# Patient Record
Sex: Female | Born: 1960 | Race: White | Hispanic: No | Marital: Married | Smoking: Former smoker
Health system: Southern US, Community
[De-identification: ages and names within clinical notes are randomized; demographics above are authoritative.]

## PROBLEM LIST (undated history)

## (undated) DIAGNOSIS — J45909 Unspecified asthma, uncomplicated: Secondary | ICD-10-CM

## (undated) DIAGNOSIS — I2510 Atherosclerotic heart disease of native coronary artery without angina pectoris: Secondary | ICD-10-CM

## (undated) DIAGNOSIS — I255 Ischemic cardiomyopathy: Secondary | ICD-10-CM

## (undated) DIAGNOSIS — F419 Anxiety disorder, unspecified: Secondary | ICD-10-CM

## (undated) DIAGNOSIS — M1990 Unspecified osteoarthritis, unspecified site: Secondary | ICD-10-CM

## (undated) DIAGNOSIS — J449 Chronic obstructive pulmonary disease, unspecified: Secondary | ICD-10-CM

## (undated) DIAGNOSIS — D649 Anemia, unspecified: Secondary | ICD-10-CM

## (undated) DIAGNOSIS — I10 Essential (primary) hypertension: Secondary | ICD-10-CM

## (undated) DIAGNOSIS — I219 Acute myocardial infarction, unspecified: Secondary | ICD-10-CM

## (undated) DIAGNOSIS — E785 Hyperlipidemia, unspecified: Secondary | ICD-10-CM

## (undated) HISTORY — DX: Acute myocardial infarction, unspecified: I21.9

## (undated) HISTORY — DX: Ischemic cardiomyopathy: I25.5

## (undated) HISTORY — DX: Morbid (severe) obesity due to excess calories: E66.01

## (undated) HISTORY — PX: KNEE ARTHROSCOPY: SUR90

## (undated) HISTORY — DX: Atherosclerotic heart disease of native coronary artery without angina pectoris: I25.10

## (undated) HISTORY — DX: Essential (primary) hypertension: I10

## (undated) HISTORY — DX: Hyperlipidemia, unspecified: E78.5

## (undated) HISTORY — DX: Chronic obstructive pulmonary disease, unspecified: J44.9

## (undated) HISTORY — PX: TONSILLECTOMY: SUR1361

## (undated) MED FILL — Medication: Fill #1 | Status: CN

---

## 2006-03-25 ENCOUNTER — Emergency Department: Payer: Self-pay | Admitting: Emergency Medicine

## 2006-04-01 ENCOUNTER — Emergency Department: Payer: Self-pay | Admitting: Emergency Medicine

## 2007-07-22 ENCOUNTER — Emergency Department: Payer: Self-pay | Admitting: Internal Medicine

## 2008-04-24 ENCOUNTER — Emergency Department: Payer: Self-pay | Admitting: Emergency Medicine

## 2008-12-21 ENCOUNTER — Emergency Department: Payer: Self-pay | Admitting: Emergency Medicine

## 2010-03-06 ENCOUNTER — Emergency Department: Payer: Self-pay | Admitting: Internal Medicine

## 2015-06-28 ENCOUNTER — Emergency Department: Payer: No Typology Code available for payment source

## 2015-06-28 ENCOUNTER — Emergency Department
Admission: EM | Admit: 2015-06-28 | Discharge: 2015-06-28 | Disposition: A | Discharge status: Home / Self Care | Payer: No Typology Code available for payment source | Attending: Emergency Medicine | Admitting: Emergency Medicine

## 2015-06-28 DIAGNOSIS — Y9389 Activity, other specified: Secondary | ICD-10-CM | POA: Insufficient documentation

## 2015-06-28 DIAGNOSIS — M542 Cervicalgia: Secondary | ICD-10-CM | POA: Diagnosis present

## 2015-06-28 DIAGNOSIS — Y92410 Unspecified street and highway as the place of occurrence of the external cause: Secondary | ICD-10-CM | POA: Diagnosis not present

## 2015-06-28 DIAGNOSIS — R0781 Pleurodynia: Secondary | ICD-10-CM

## 2015-06-28 DIAGNOSIS — Y999 Unspecified external cause status: Secondary | ICD-10-CM | POA: Insufficient documentation

## 2015-06-28 DIAGNOSIS — S161XXA Strain of muscle, fascia and tendon at neck level, initial encounter: Secondary | ICD-10-CM | POA: Insufficient documentation

## 2015-06-28 DIAGNOSIS — M25511 Pain in right shoulder: Secondary | ICD-10-CM | POA: Insufficient documentation

## 2015-06-28 MED ORDER — METHOCARBAMOL 500 MG PO TABS
1000.0000 mg | ORAL_TABLET | Freq: Once | ORAL | Status: AC
  Administered 2015-06-28: 1000 mg via ORAL
  Filled 2015-06-28: qty 2

## 2015-06-28 MED ORDER — IBUPROFEN 600 MG PO TABS: 600.0000 mg | ORAL_TABLET | Freq: Three times a day (TID) | ORAL | Status: DC | PRN

## 2015-06-28 MED ORDER — OXYCODONE-ACETAMINOPHEN 5-325 MG PO TABS
1.0000 | ORAL_TABLET | Freq: Once | ORAL | Status: AC
  Administered 2015-06-28: 1 via ORAL
  Filled 2015-06-28: qty 1

## 2015-06-28 MED ORDER — IBUPROFEN 600 MG PO TABS
600.0000 mg | ORAL_TABLET | Freq: Once | ORAL | Status: AC
Start: 2015-06-28 — End: 2015-06-28
  Administered 2015-06-28: 600 mg via ORAL
  Filled 2015-06-28: qty 1

## 2015-06-28 MED ORDER — TRAMADOL HCL 50 MG PO TABS: 50.0000 mg | ORAL_TABLET | Freq: Four times a day (QID) | ORAL | Status: DC | PRN

## 2015-06-28 MED ORDER — METHOCARBAMOL 750 MG PO TABS: 750.0000 mg | ORAL_TABLET | Freq: Four times a day (QID) | ORAL | Status: DC
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
## 2015-06-28 NOTE — Discharge Instructions (Signed)
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
## 2015-07-01 ENCOUNTER — Emergency Department
Admission: EM | Admit: 2015-07-01 | Discharge: 2015-07-01 | Disposition: A | Discharge status: Home / Self Care | Payer: No Typology Code available for payment source | Attending: Emergency Medicine | Admitting: Emergency Medicine

## 2015-07-01 DIAGNOSIS — Y939 Activity, unspecified: Secondary | ICD-10-CM | POA: Diagnosis not present

## 2015-07-01 DIAGNOSIS — S46911A Strain of unspecified muscle, fascia and tendon at shoulder and upper arm level, right arm, initial encounter: Secondary | ICD-10-CM | POA: Insufficient documentation

## 2015-07-01 DIAGNOSIS — Y92410 Unspecified street and highway as the place of occurrence of the external cause: Secondary | ICD-10-CM | POA: Diagnosis not present

## 2015-07-01 DIAGNOSIS — S239XXA Sprain of unspecified parts of thorax, initial encounter: Secondary | ICD-10-CM

## 2015-07-01 DIAGNOSIS — Y999 Unspecified external cause status: Secondary | ICD-10-CM | POA: Diagnosis not present

## 2015-07-01 DIAGNOSIS — M79641 Pain in right hand: Secondary | ICD-10-CM | POA: Diagnosis present

## 2015-07-01 MED ORDER — TRAMADOL HCL 50 MG PO TABS: 50.0000 mg | ORAL_TABLET | Freq: Three times a day (TID) | ORAL | Status: DC | PRN

## 2015-07-01 MED ORDER — IBUPROFEN 800 MG PO TABS: 800.0000 mg | ORAL_TABLET | Freq: Three times a day (TID) | ORAL | Status: DC | PRN

## 2015-07-01 MED ORDER — METHOCARBAMOL 750 MG PO TABS: 750.0000 mg | ORAL_TABLET | Freq: Four times a day (QID) | ORAL | Status: DC
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
## 2015-08-24 ENCOUNTER — Other Ambulatory Visit: Payer: Self-pay | Admitting: Student

## 2015-08-24 DIAGNOSIS — M7581 Other shoulder lesions, right shoulder: Secondary | ICD-10-CM

## 2015-08-24 DIAGNOSIS — M7521 Bicipital tendinitis, right shoulder: Secondary | ICD-10-CM

## 2015-09-01 ENCOUNTER — Other Ambulatory Visit: Payer: Self-pay | Admitting: Student

## 2015-09-01 DIAGNOSIS — M5412 Radiculopathy, cervical region: Secondary | ICD-10-CM

## 2015-09-03 ENCOUNTER — Ambulatory Visit
Admission: RE | Admit: 2015-09-03 | Discharge: 2015-09-03 | Disposition: A | Discharge status: Home / Self Care | Payer: No Typology Code available for payment source | Source: Ambulatory Visit | Attending: Student | Admitting: Student

## 2015-09-03 DIAGNOSIS — M67911 Unspecified disorder of synovium and tendon, right shoulder: Secondary | ICD-10-CM | POA: Insufficient documentation

## 2015-09-03 DIAGNOSIS — E010 Iodine-deficiency related diffuse (endemic) goiter: Secondary | ICD-10-CM | POA: Diagnosis not present

## 2015-09-03 DIAGNOSIS — M5030 Other cervical disc degeneration, unspecified cervical region: Secondary | ICD-10-CM | POA: Diagnosis not present

## 2015-09-03 DIAGNOSIS — M19011 Primary osteoarthritis, right shoulder: Secondary | ICD-10-CM | POA: Diagnosis not present

## 2015-09-03 DIAGNOSIS — M24211 Disorder of ligament, right shoulder: Secondary | ICD-10-CM | POA: Insufficient documentation

## 2015-09-03 DIAGNOSIS — M50223 Other cervical disc displacement at C6-C7 level: Secondary | ICD-10-CM | POA: Insufficient documentation

## 2015-09-03 DIAGNOSIS — M5412 Radiculopathy, cervical region: Secondary | ICD-10-CM | POA: Insufficient documentation

## 2015-09-03 DIAGNOSIS — M4802 Spinal stenosis, cervical region: Secondary | ICD-10-CM | POA: Diagnosis not present

## 2015-09-03 DIAGNOSIS — M7521 Bicipital tendinitis, right shoulder: Secondary | ICD-10-CM

## 2015-09-03 DIAGNOSIS — M7581 Other shoulder lesions, right shoulder: Secondary | ICD-10-CM

## 2015-09-14 ENCOUNTER — Ambulatory Visit: Admission: RE | Admit: 2015-09-14 | Payer: No Typology Code available for payment source | Source: Ambulatory Visit

## 2015-11-15 ENCOUNTER — Encounter
Admission: RE | Admit: 2015-11-15 | Discharge: 2015-11-15 | Disposition: A | Discharge status: Home / Self Care | Payer: No Typology Code available for payment source | Source: Ambulatory Visit | Attending: Surgery | Admitting: Surgery

## 2015-11-15 NOTE — Patient Instructions (Signed)
  Your procedure is scheduled on: 11-22-15 Report to Same Day Surgery 2nd floor medical mall To find out your arrival time please call [phone] between 1PM - 3PM on 11-21-15  Remember: Instructions that are not followed completely may result in serious medical risk, up to and including death, or upon the discretion of your surgeon and anesthesiologist your surgery may need to be rescheduled.    _x___ 1. Do not eat food or drink liquids after midnight. No gum chewing or hard candies.     __x__ 2. No Alcohol for 24 hours before or after surgery.   __x__3. No Smoking for 24 prior to surgery.   ____  4. Bring all medications with you on the day of surgery if instructed.    __x__ 5. Notify your doctor if there is any change in your medical condition     (cold, fever, infections).     Do not wear jewelry, make-up, hairpins, clips or nail polish.  Do not wear lotions, powders, or perfumes. You may wear deodorant.  Do not shave 48 hours prior to surgery. Men may shave face and neck.  Do not bring valuables to the hospital.    [facility] is not responsible for any belongings or valuables.               Contacts, dentures or bridgework may not be worn into surgery.  Leave your suitcase in the car. After surgery it may be brought to your room.  For patients admitted to the hospital, discharge time is determined by your treatment team.   Patients discharged the day of surgery will not be allowed to drive home.    Please read over the following fact sheets that you were given:   [facility] Preparing for Surgery and or MRSA Information   ____ Take these medicines the morning of surgery with A SIP OF WATER:    1. NONE  2.  3.  4.  5.  6.  ____Fleets enema or Magnesium Citrate as directed.   ____ Use CHG Soap or sage wipes as directed on instruction sheet   ____ Use inhalers on the day of surgery and bring to hospital day of surgery  ____ Stop metformin 2 days prior to  surgery    ____ Take 1/2 of usual insulin dose the night before surgery and none on the morning of surgery.   ____ Stop aspirin or coumadin, or plavix  x__ Stop Anti-inflammatories such as Advil, Aleve, Ibuprofen, Motrin, Naproxen,          Naprosyn, Goodies powders or aspirin products-STOP IBUPROFEN NOW-Ok to take Tylenol.   ____ Stop supplements until after surgery.    ____ Bring C-Pap to the hospital.

## 2015-11-22 ENCOUNTER — Ambulatory Visit: Payer: Self-pay | Admitting: Anesthesiology

## 2015-11-22 ENCOUNTER — Encounter: Payer: Self-pay | Admitting: *Deleted

## 2015-11-22 ENCOUNTER — Encounter
Admission: RE | Disposition: A | Discharge status: Home / Self Care | Payer: Self-pay | Source: Ambulatory Visit | Attending: Surgery

## 2015-11-22 ENCOUNTER — Ambulatory Visit
Admission: RE | Admit: 2015-11-22 | Discharge: 2015-11-22 | Disposition: A | Discharge status: Home / Self Care | Payer: Self-pay | Source: Ambulatory Visit | Attending: Surgery | Admitting: Surgery

## 2015-11-22 DIAGNOSIS — M7541 Impingement syndrome of right shoulder: Secondary | ICD-10-CM | POA: Insufficient documentation

## 2015-11-22 DIAGNOSIS — G5621 Lesion of ulnar nerve, right upper limb: Secondary | ICD-10-CM | POA: Insufficient documentation

## 2015-11-22 DIAGNOSIS — E669 Obesity, unspecified: Secondary | ICD-10-CM | POA: Insufficient documentation

## 2015-11-22 DIAGNOSIS — M19011 Primary osteoarthritis, right shoulder: Secondary | ICD-10-CM | POA: Insufficient documentation

## 2015-11-22 DIAGNOSIS — Z880 Allergy status to penicillin: Secondary | ICD-10-CM | POA: Insufficient documentation

## 2015-11-22 DIAGNOSIS — M65811 Other synovitis and tenosynovitis, right shoulder: Secondary | ICD-10-CM | POA: Insufficient documentation

## 2015-11-22 DIAGNOSIS — Z87891 Personal history of nicotine dependence: Secondary | ICD-10-CM | POA: Insufficient documentation

## 2015-11-22 DIAGNOSIS — M7521 Bicipital tendinitis, right shoulder: Secondary | ICD-10-CM | POA: Insufficient documentation

## 2015-11-22 DIAGNOSIS — Z6837 Body mass index (BMI) 37.0-37.9, adult: Secondary | ICD-10-CM | POA: Insufficient documentation

## 2015-11-22 DIAGNOSIS — J449 Chronic obstructive pulmonary disease, unspecified: Secondary | ICD-10-CM | POA: Insufficient documentation

## 2015-11-22 HISTORY — PX: SHOULDER ARTHROSCOPY WITH SUBACROMIAL DECOMPRESSION: SHX5684

## 2015-11-22 HISTORY — PX: ULNAR NERVE TRANSPOSITION: SHX2595

## 2015-11-22 SURGERY — SHOULDER ARTHROSCOPY WITH SUBACROMIAL DECOMPRESSION
Anesthesia: General | Site: Shoulder | Laterality: Right | Wound class: Clean

## 2015-11-22 MED ORDER — FENTANYL CITRATE (PF) 100 MCG/2ML IJ SOLN
INTRAMUSCULAR | Status: DC | PRN
  Administered 2015-11-22: 250 ug via INTRAVENOUS
  Administered 2015-11-22: 100 ug via INTRAVENOUS

## 2015-11-22 MED ORDER — KETAMINE HCL 50 MG/ML IJ SOLN
INTRAMUSCULAR | Status: DC | PRN
  Administered 2015-11-22: 50 mg via INTRAVENOUS

## 2015-11-22 MED ORDER — PROPOFOL 10 MG/ML IV BOLUS
INTRAVENOUS | Status: DC | PRN
  Administered 2015-11-22: 50 mg via INTRAVENOUS
  Administered 2015-11-22: 200 mg via INTRAVENOUS

## 2015-11-22 MED ORDER — METOCLOPRAMIDE HCL 10 MG PO TABS: 5.0000 mg | ORAL_TABLET | Freq: Three times a day (TID) | ORAL | Status: DC | PRN

## 2015-11-22 MED ORDER — FENTANYL CITRATE (PF) 100 MCG/2ML IJ SOLN
25.0000 ug | INTRAMUSCULAR | Status: DC | PRN
  Administered 2015-11-22: 25 ug via INTRAVENOUS

## 2015-11-22 MED ORDER — PROMETHAZINE HCL 25 MG/ML IJ SOLN
6.2500 mg | INTRAMUSCULAR | Status: DC | PRN
  Administered 2015-11-22: 12.5 mg via INTRAVENOUS

## 2015-11-22 MED ORDER — ACETAMINOPHEN 10 MG/ML IV SOLN
INTRAVENOUS | Status: AC
Start: 2015-11-22 — End: 2015-11-22
  Filled 2015-11-22: qty 100

## 2015-11-22 MED ORDER — OXYCODONE HCL 5 MG PO TABS: 5.0000 mg | ORAL_TABLET | ORAL | Status: DC | PRN

## 2015-11-22 MED ORDER — DEXAMETHASONE SODIUM PHOSPHATE 4 MG/ML IJ SOLN
INTRAMUSCULAR | Status: DC | PRN
  Administered 2015-11-22: 4 mg via INTRAVENOUS

## 2015-11-22 MED ORDER — PROMETHAZINE HCL 25 MG/ML IJ SOLN
INTRAMUSCULAR | Status: AC
  Administered 2015-11-22: 12.5 mg via INTRAVENOUS
  Filled 2015-11-22: qty 1

## 2015-11-22 MED ORDER — MIDAZOLAM HCL 2 MG/2ML IJ SOLN
INTRAMUSCULAR | Status: AC
  Administered 2015-11-22: 2 mg via INTRAVENOUS
  Filled 2015-11-22: qty 2

## 2015-11-22 MED ORDER — OXYCODONE HCL 5 MG PO TABS: 5.0000 mg | ORAL_TABLET | Freq: Once | ORAL | Status: DC | PRN

## 2015-11-22 MED ORDER — POTASSIUM CHLORIDE IN NACL 20-0.9 MEQ/L-% IV SOLN: INTRAVENOUS | Status: DC

## 2015-11-22 MED ORDER — FAMOTIDINE 20 MG PO TABS
20.0000 mg | ORAL_TABLET | Freq: Once | ORAL | Status: AC
  Administered 2015-11-22: 20 mg via ORAL

## 2015-11-22 MED ORDER — ROPIVACAINE HCL 5 MG/ML IJ SOLN
INTRAMUSCULAR | Status: AC
  Filled 2015-11-22: qty 40

## 2015-11-22 MED ORDER — LIDOCAINE HCL (PF) 4 % IJ SOLN
INTRAMUSCULAR | Status: DC | PRN
  Administered 2015-11-22: 4 mL via RESPIRATORY_TRACT

## 2015-11-22 MED ORDER — SODIUM CHLORIDE 0.9 % IJ SOLN
INTRAMUSCULAR | Status: AC
  Filled 2015-11-22: qty 10

## 2015-11-22 MED ORDER — ACETAMINOPHEN 10 MG/ML IV SOLN
INTRAVENOUS | Status: DC | PRN
  Administered 2015-11-22: 1000 mg via INTRAVENOUS

## 2015-11-22 MED ORDER — LIDOCAINE HCL (CARDIAC) 20 MG/ML IV SOLN
INTRAVENOUS | Status: DC | PRN
  Administered 2015-11-22: 100 mg via INTRAVENOUS
  Administered 2015-11-22: 60 mg via INTRAVENOUS

## 2015-11-22 MED ORDER — ONDANSETRON HCL 4 MG/2ML IJ SOLN: 4.0000 mg | Freq: Four times a day (QID) | INTRAMUSCULAR | Status: DC | PRN

## 2015-11-22 MED ORDER — CLINDAMYCIN PHOSPHATE 900 MG/50ML IV SOLN
INTRAVENOUS | Status: AC
  Filled 2015-11-22: qty 50

## 2015-11-22 MED ORDER — BUPIVACAINE-EPINEPHRINE (PF) 0.5% -1:200000 IJ SOLN
INTRAMUSCULAR | Status: AC
  Filled 2015-11-22: qty 30

## 2015-11-22 MED ORDER — SUGAMMADEX SODIUM 200 MG/2ML IV SOLN
INTRAVENOUS | Status: DC | PRN
  Administered 2015-11-22: 200 mg via INTRAVENOUS

## 2015-11-22 MED ORDER — LIDOCAINE HCL (PF) 1 % IJ SOLN
INTRAMUSCULAR | Status: AC
  Filled 2015-11-22: qty 5

## 2015-11-22 MED ORDER — NEOMYCIN-POLYMYXIN B GU 40-200000 IR SOLN
Status: DC | PRN
  Administered 2015-11-22: 2 mL

## 2015-11-22 MED ORDER — CLINDAMYCIN PHOSPHATE 900 MG/50ML IV SOLN
900.0000 mg | Freq: Once | INTRAVENOUS | Status: AC
  Administered 2015-11-22: 900 mg via INTRAVENOUS

## 2015-11-22 MED ORDER — ONDANSETRON HCL 4 MG PO TABS: 4.0000 mg | ORAL_TABLET | Freq: Four times a day (QID) | ORAL | Status: DC | PRN

## 2015-11-22 MED ORDER — MIDAZOLAM HCL 2 MG/2ML IJ SOLN
2.0000 mg | Freq: Once | INTRAMUSCULAR | Status: AC
  Administered 2015-11-22: 2 mg via INTRAVENOUS

## 2015-11-22 MED ORDER — ROCURONIUM BROMIDE 100 MG/10ML IV SOLN
INTRAVENOUS | Status: DC | PRN
  Administered 2015-11-22: 30 mg via INTRAVENOUS
  Administered 2015-11-22: 50 mg via INTRAVENOUS

## 2015-11-22 MED ORDER — PHENYLEPHRINE HCL 10 MG/ML IJ SOLN
INTRAMUSCULAR | Status: DC | PRN
  Administered 2015-11-22: 200 ug via INTRAVENOUS

## 2015-11-22 MED ORDER — NEOMYCIN-POLYMYXIN B GU 40-200000 IR SOLN
Status: AC
  Filled 2015-11-22: qty 2

## 2015-11-22 MED ORDER — BUPIVACAINE-EPINEPHRINE 0.5% -1:200000 IJ SOLN
INTRAMUSCULAR | Status: DC | PRN
  Administered 2015-11-22: 30 mL

## 2015-11-22 MED ORDER — ROPIVACAINE HCL 5 MG/ML IJ SOLN
INTRAMUSCULAR | Status: DC | PRN
  Administered 2015-11-22: 30 mL via PERINEURAL

## 2015-11-22 MED ORDER — LACTATED RINGERS IV SOLN
INTRAVENOUS | Status: DC
  Administered 2015-11-22 (×2): via INTRAVENOUS

## 2015-11-22 MED ORDER — FENTANYL CITRATE (PF) 100 MCG/2ML IJ SOLN
INTRAMUSCULAR | Status: AC
  Administered 2015-11-22: 25 ug via INTRAVENOUS
  Filled 2015-11-22: qty 2

## 2015-11-22 MED ORDER — MEPERIDINE HCL 25 MG/ML IJ SOLN: 6.2500 mg | INTRAMUSCULAR | Status: DC | PRN

## 2015-11-22 MED ORDER — PHENYLEPHRINE HCL 10 MG/ML IJ SOLN
INTRAVENOUS | Status: DC | PRN
  Administered 2015-11-22: 40 ug/min via INTRAVENOUS

## 2015-11-22 MED ORDER — EPINEPHRINE PF 1 MG/ML IJ SOLN
INTRAMUSCULAR | Status: AC
  Filled 2015-11-22: qty 2

## 2015-11-22 MED ORDER — OXYCODONE HCL 5 MG PO TABS: 5.0000 mg | ORAL_TABLET | ORAL | 0 refills | Status: DC | PRN

## 2015-11-22 MED ORDER — METOCLOPRAMIDE HCL 5 MG/ML IJ SOLN: 5.0000 mg | Freq: Three times a day (TID) | INTRAMUSCULAR | Status: DC | PRN

## 2015-11-22 MED ORDER — ONDANSETRON HCL 4 MG/2ML IJ SOLN
INTRAMUSCULAR | Status: DC | PRN
  Administered 2015-11-22: 4 mg via INTRAVENOUS

## 2015-11-22 MED ORDER — BUPIVACAINE HCL (PF) 0.5 % IJ SOLN
INTRAMUSCULAR | Status: DC | PRN
  Administered 2015-11-22: 20 mL

## 2015-11-22 MED ORDER — FAMOTIDINE 20 MG PO TABS
ORAL_TABLET | ORAL | Status: AC
  Administered 2015-11-22: 20 mg via ORAL
  Filled 2015-11-22: qty 1

## 2015-11-22 MED ORDER — BUPIVACAINE HCL (PF) 0.5 % IJ SOLN
INTRAMUSCULAR | Status: AC
  Filled 2015-11-22: qty 30

## 2015-11-22 MED ORDER — OXYCODONE HCL 5 MG/5ML PO SOLN: 5.0000 mg | Freq: Once | ORAL | Status: DC | PRN

## 2015-11-22 MED ORDER — EPINEPHRINE PF 1 MG/ML IJ SOLN
INTRAMUSCULAR | Status: DC | PRN
  Administered 2015-11-22: 2 mL

## 2015-11-22 SURGICAL SUPPLY — 75 items
ANCHOR JUGGERKNOT WTAP NDL 2.9 (Anchor) ×4 IMPLANT
BANDAGE ELASTIC 4 LF NS (GAUZE/BANDAGES/DRESSINGS) ×4 IMPLANT
BIT DRILL JUGRKNT W/NDL BIT2.9 (DRILL) ×2 IMPLANT
BLADE FULL RADIUS 3.5 (BLADE) ×4 IMPLANT
BLADE SHAVER 4.5X7 STR FR (MISCELLANEOUS) IMPLANT
BNDG COHESIVE 4X5 TAN STRL (GAUZE/BANDAGES/DRESSINGS) ×4 IMPLANT
BNDG ESMARK 4X12 TAN STRL LF (GAUZE/BANDAGES/DRESSINGS) ×4 IMPLANT
BUR ACROMIONIZER 4.0 (BURR) ×4 IMPLANT
BUR BR 5.5 WIDE MOUTH (BURR) IMPLANT
CANISTER SUCT 1200ML W/VALVE (MISCELLANEOUS) ×4 IMPLANT
CANNULA SHAVER 8MMX76MM (CANNULA) ×4 IMPLANT
CHLORAPREP W/TINT 26ML (MISCELLANEOUS) ×8 IMPLANT
CLOSURE WOUND 1/2 X4 (GAUZE/BANDAGES/DRESSINGS) ×1
CNTNR SPEC 2.5X3XGRAD LEK (MISCELLANEOUS) ×2
CONT SPEC 4OZ STER OR WHT (MISCELLANEOUS) ×2
CONTAINER SPEC 2.5X3XGRAD LEK (MISCELLANEOUS) ×2 IMPLANT
CORD BIP STRL DISP 12FT (MISCELLANEOUS) ×4 IMPLANT
COVER MAYO STAND STRL (DRAPES) ×4 IMPLANT
CUFF TOURN 18 STER (MISCELLANEOUS) ×4 IMPLANT
CUFF TOURN 24 STER (MISCELLANEOUS) IMPLANT
DRAPE IMP U-DRAPE 54X76 (DRAPES) ×12 IMPLANT
DRAPE SHEET LG 3/4 BI-LAMINATE (DRAPES) ×8 IMPLANT
DRAPE U-SHAPE 47X51 STRL (DRAPES) ×4 IMPLANT
DRILL JUGGERKNOT W/NDL BIT 2.9 (DRILL) ×4
DRSG OPSITE POSTOP 4X8 (GAUZE/BANDAGES/DRESSINGS) ×8 IMPLANT
ELECT REM PT RETURN 9FT ADLT (ELECTROSURGICAL) ×4
ELECTRODE REM PT RTRN 9FT ADLT (ELECTROSURGICAL) ×2 IMPLANT
FORCEPS JEWEL BIP 4-3/4 STR (INSTRUMENTS) ×4 IMPLANT
GAUZE PETRO XEROFOAM 1X8 (MISCELLANEOUS) ×4 IMPLANT
GAUZE SPONGE 4X4 12PLY STRL (GAUZE/BANDAGES/DRESSINGS) ×4 IMPLANT
GLOVE BIO SURGEON STRL SZ7.5 (GLOVE) ×8 IMPLANT
GLOVE BIO SURGEON STRL SZ8 (GLOVE) ×8 IMPLANT
GLOVE BIOGEL PI IND STRL 8 (GLOVE) ×2 IMPLANT
GLOVE BIOGEL PI INDICATOR 8 (GLOVE) ×2
GLOVE INDICATOR 8.0 STRL GRN (GLOVE) ×4 IMPLANT
GOWN STRL REUS W/ TWL LRG LVL3 (GOWN DISPOSABLE) ×4 IMPLANT
GOWN STRL REUS W/ TWL XL LVL3 (GOWN DISPOSABLE) ×2 IMPLANT
GOWN STRL REUS W/TWL LRG LVL3 (GOWN DISPOSABLE) ×4
GOWN STRL REUS W/TWL XL LVL3 (GOWN DISPOSABLE) ×2
GRASPER SUT 15 45D LOW PRO (SUTURE) IMPLANT
IV LACTATED RINGER IRRG 3000ML (IV SOLUTION) ×4
IV LR IRRIG 3000ML ARTHROMATIC (IV SOLUTION) ×4 IMPLANT
KIT RM TURNOVER STRD PROC AR (KITS) ×4 IMPLANT
LOOP RED MAXI  1X406MM (MISCELLANEOUS) ×2
LOOP VESSEL MAXI 1X406 RED (MISCELLANEOUS) ×2 IMPLANT
MANIFOLD NEPTUNE II (INSTRUMENTS) ×4 IMPLANT
MASK FACE SPIDER DISP (MASK) ×4 IMPLANT
MAT BLUE FLOOR 46X72 FLO (MISCELLANEOUS) IMPLANT
NEEDLE REVERSE CUT 1/2 CRC (NEEDLE) IMPLANT
NS IRRIG 500ML POUR BTL (IV SOLUTION) ×4 IMPLANT
PACK ARTHROSCOPY SHOULDER (MISCELLANEOUS) ×4 IMPLANT
PACK EXTREMITY ARMC (MISCELLANEOUS) ×4 IMPLANT
PAD ABD DERMACEA PRESS 5X9 (GAUZE/BANDAGES/DRESSINGS) ×4 IMPLANT
PAD CAST CTTN 4X4 STRL (SOFTGOODS) ×6 IMPLANT
PADDING CAST COTTON 4X4 STRL (SOFTGOODS) ×6
PENCIL ELECTRO HAND CTR (MISCELLANEOUS) ×4 IMPLANT
SLING ARM LRG DEEP (SOFTGOODS) IMPLANT
SLING ULTRA II LG (MISCELLANEOUS) ×4 IMPLANT
SPONGE LAP 18X18 5 PK (GAUZE/BANDAGES/DRESSINGS) ×4 IMPLANT
STAPLER SKIN PROX 35W (STAPLE) ×4 IMPLANT
STOCKINETTE IMPERVIOUS 9X36 MD (GAUZE/BANDAGES/DRESSINGS) ×4 IMPLANT
STRAP SAFETY BODY (MISCELLANEOUS) ×4 IMPLANT
STRIP CLOSURE SKIN 1/2X4 (GAUZE/BANDAGES/DRESSINGS) ×3 IMPLANT
SUT ETHIBOND 0 MO6 C/R (SUTURE) ×4 IMPLANT
SUT VIC AB 2-0 CT1 27 (SUTURE) ×4
SUT VIC AB 2-0 CT1 36 (SUTURE) ×4 IMPLANT
SUT VIC AB 2-0 CT1 TAPERPNT 27 (SUTURE) ×4 IMPLANT
SUT VIC AB 3-0 SH 27 (SUTURE) ×2
SUT VIC AB 3-0 SH 27X BRD (SUTURE) ×2 IMPLANT
TAPE MICROFOAM 4IN (TAPE) ×4 IMPLANT
TOWEL OR 17X26 4PK STRL BLUE (TOWEL DISPOSABLE) ×8 IMPLANT
TUBING ARTHRO INFLOW-ONLY STRL (TUBING) ×4 IMPLANT
TUBING CONNECTING 10 (TUBING) ×6 IMPLANT
TUBING CONNECTING 10' (TUBING) ×2
WAND HAND CNTRL MULTIVAC 90 (MISCELLANEOUS) ×4 IMPLANT

## 2015-11-22 NOTE — Anesthesia Procedure Notes (Signed)
Anesthesia Regional Block:  Interscalene brachial plexus block  Pre-Anesthetic Checklist: ,, timeout performed, Correct Patient, Correct Site, Correct Laterality, Correct Procedure, Correct Position, site marked, Risks and benefits discussed,  Surgical consent,  Pre-op evaluation,  At surgeon's request and post-op pain management  Laterality: Right  Prep: chloraprep       Needles:  Injection technique: Single-shot  Needle Type: Stimiplex     Needle Length: 9cm 9 cm Needle Gauge: 22 and 22 G    Additional Needles:  Procedures: ultrasound guided (picture in chart) Interscalene brachial plexus block Narrative:  Start time: 11/22/2015 10:05 AM End time: 11/22/2015 10:15 AM Injection made incrementally with aspirations every 5 mL.  Performed by: Personally  Anesthesiologist: Sharolyn Weber  Additional Notes: Functioning IV was confirmed and monitors were applied.  A 35mm 22ga Stimuplex needle was used. Sterile prep and drape,hand hygiene and sterile gloves were used.  Negative aspiration and negative test dose prior to incremental administration of local anesthetic. The patient tolerated the procedure well.

## 2015-11-22 NOTE — H&P (Signed)
Paper H&P to be scanned into permanent record. H&P reviewed. No changes. 

## 2015-11-22 NOTE — Anesthesia Preprocedure Evaluation (Signed)
Anesthesia Evaluation  Patient identified by MRN, date of birth, ID band Patient awake    Reviewed: Allergy & Precautions, NPO status , Patient's Chart, lab work & pertinent test results  History of Anesthesia Complications Negative for: history of anesthetic complications  Airway Mallampati: II  TM Distance: >3 FB Neck ROM: Full    Dental  (+) Missing, Poor Dentition   Pulmonary neg sleep apnea, COPD,  COPD inhaler, former smoker,    breath sounds clear to auscultation- rhonchi (-) wheezing      Cardiovascular Exercise Tolerance: Good (-) hypertension(-) CAD, (-) Past MI and (-) Cardiac Stents  Rhythm:Regular Rate:Normal - Systolic murmurs and - Diastolic murmurs    Neuro/Psych negative neurological ROS  negative psych ROS   GI/Hepatic negative GI ROS, Neg liver ROS,   Endo/Other  negative endocrine ROSneg diabetes  Renal/GU negative Renal ROS     Musculoskeletal negative musculoskeletal ROS (+)   Abdominal (+) + obese,   Peds  Hematology negative hematology ROS (+)   Anesthesia Other Findings Past Medical History: No date: Bronchitis, chronic (HCC)     Comment: PT CURRENTLY HAVING NO ISSUES WITH BRONCHITIS               (11-15-15)   Reproductive/Obstetrics                             Anesthesia Physical Anesthesia Plan  ASA: II  Anesthesia Plan: General   Post-op Pain Management:  Regional for Post-op pain   Induction: Intravenous  Airway Management Planned: Oral ETT  Additional Equipment:   Intra-op Plan:   Post-operative Plan: Extubation in OR  Informed Consent: I have reviewed the patients History and Physical, chart, labs and discussed the procedure including the risks, benefits and alternatives for the proposed anesthesia with the patient or authorized representative who has indicated his/her understanding and acceptance.   Dental advisory given  Plan Discussed  with: CRNA and Anesthesiologist  Anesthesia Plan Comments:         Anesthesia Quick Evaluation

## 2015-11-22 NOTE — Discharge Instructions (Addendum)
Keep dressings dry and intact.  May shower after shoulder dressing changed on post-op day #4 (Saturday). Keep op-site dressing intact over elbow incision. Cover staples with Band-Aids after drying off. Apply ice frequently to shoulder and elbow. Take ibuprofen 800 mg TID with meals for 7-10 days, then as necessary. Take oxycodone as prescribed when needed.  May supplement with ES Tylenol if necessary. Keep shoulder immobilizer on at all times except may remove for bathing purposes. Follow-up in 10-14 days or as scheduled.  AMBULATORY SURGERY  DISCHARGE INSTRUCTIONS   1) The drugs that you were given will stay in your system until tomorrow so for the next 24 hours you should not:  A) Drive an automobile B) Make any legal decisions C) Drink any alcoholic beverage   2) You may resume regular meals tomorrow.  Today it is better to start with liquids and gradually work up to solid foods.  You may eat anything you prefer, but it is better to start with liquids, then soup and crackers, and gradually work up to solid foods.   3) Please notify your doctor immediately if you have any unusual bleeding, trouble breathing, redness and pain at the surgery site, drainage, fever, or pain not relieved by medication.   Please contact your physician with any problems or Same Day Surgery at [phone], Monday through Friday 6 am to 4 pm, or [place] at [facility] number at [phone].
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
## 2015-11-22 NOTE — Anesthesia Procedure Notes (Signed)
Procedure Name: Intubation Date/Time: 11/22/2015 11:06 AM Performed by: Shirlee Limerick, Miquel Lamson Pre-anesthesia Checklist: Patient identified, Emergency Drugs available, Suction available and Patient being monitored Patient Re-evaluated:Patient Re-evaluated prior to inductionOxygen Delivery Method: Circle system utilized Preoxygenation: Pre-oxygenation with 100% oxygen Intubation Type: IV induction Laryngoscope Size: Mac and 4 Grade View: Grade II Tube size: 7.0 mm Number of attempts: 1 Placement Confirmation: ETT inserted through vocal cords under direct vision,  positive ETCO2 and breath sounds checked- equal and bilateral Secured at: 22 cm Tube secured with: Tape Dental Injury: Dental damage  Difficulty Due To: Difficult Airway- due to dentition

## 2015-11-22 NOTE — Transfer of Care (Signed)
Immediate Anesthesia Transfer of Care Note  Patient: Lindsey Gibson  Procedure(s) Performed: Procedure(s): SHOULDER ARTHROSCOPY WITH DEBRIDEMENT, DECOMPRESSION  AND BICEPS TENODESIS (Right) ULNAR NERVE DECOMPRESSION/TRANSPOSITION (Right)  Patient Location: PACU  Anesthesia Type:General  Level of Consciousness: awake, alert , oriented and patient cooperative  Airway & Oxygen Therapy: Patient Spontanous Breathing and Patient connected to nasal cannula oxygen  Post-op Assessment: Report given to RN and Post -op Vital signs reviewed and stable  Post vital signs: Reviewed and stable  Last Vitals:  Vitals:   11/22/15 0935 11/22/15 1353  BP: (!) 185/166 (!) 151/74  Pulse: 95 (!) 105  Resp: 18 10  Temp: 36.2 C 36.2 C    Last Pain:  Vitals:   11/22/15 0935  TempSrc: Oral      Patients Stated Pain Goal: 4 (11/22/15 0935)  Complications: No apparent anesthesia complications

## 2015-11-22 NOTE — Anesthesia Postprocedure Evaluation (Addendum)
Anesthesia Post Note  Patient: SKYLETTE BAYARD  Procedure(s) Performed: Procedure(s) (LRB): SHOULDER ARTHROSCOPY WITH DEBRIDEMENT, DECOMPRESSION  AND BICEPS TENODESIS (Right) ULNAR NERVE DECOMPRESSION/TRANSPOSITION (Right)  Patient location during evaluation: PACU Anesthesia Type: General Level of consciousness: awake and alert and oriented Pain management: pain level controlled Vital Signs Assessment: post-procedure vital signs reviewed and stable Respiratory status: spontaneous breathing, nonlabored ventilation and respiratory function stable Cardiovascular status: blood pressure returned to baseline and stable Postop Assessment: no signs of nausea or vomiting Anesthetic complications: yes Anesthetic complication details: dental damage, loose tooth fell out during laryngoscopy and anesthesia complications   Last Vitals:  Vitals:   11/22/15 1434 11/22/15 1448  BP: (!) 146/89 (!) 148/68  Pulse: 90 81  Resp: 14 16  Temp: 36.4 C 36.3 C    Last Pain:  Vitals:   11/22/15 1448  TempSrc: Temporal  PainSc: 2                  Daneen Volcy

## 2015-11-23 ENCOUNTER — Encounter: Payer: Self-pay | Admitting: Surgery

## 2016-01-02 DIAGNOSIS — I219 Acute myocardial infarction, unspecified: Secondary | ICD-10-CM

## 2016-01-02 HISTORY — DX: Acute myocardial infarction, unspecified: I21.9

## 2016-12-27 ENCOUNTER — Encounter: Payer: Self-pay | Admitting: Intensive Care

## 2016-12-27 ENCOUNTER — Emergency Department: Payer: Medicaid Other

## 2016-12-27 ENCOUNTER — Inpatient Hospital Stay (HOSPITAL_COMMUNITY)
Admit: 2016-12-27 | Discharge: 2016-12-27 | Disposition: A | Discharge status: Home / Self Care | Payer: Medicaid Other | Attending: Physician Assistant | Admitting: Physician Assistant

## 2016-12-27 ENCOUNTER — Inpatient Hospital Stay
Admission: EM | Admit: 2016-12-27 | Discharge: 2016-12-29 | DRG: 246 | Disposition: A | Discharge status: Home / Self Care | Payer: Medicaid Other | Attending: Internal Medicine | Admitting: Internal Medicine

## 2016-12-27 ENCOUNTER — Encounter
Admission: EM | Disposition: A | Discharge status: Home / Self Care | Payer: Self-pay | Source: Home / Self Care | Attending: Internal Medicine

## 2016-12-27 DIAGNOSIS — A419 Sepsis, unspecified organism: Secondary | ICD-10-CM

## 2016-12-27 DIAGNOSIS — J9601 Acute respiratory failure with hypoxia: Secondary | ICD-10-CM | POA: Diagnosis present

## 2016-12-27 DIAGNOSIS — Z66 Do not resuscitate: Secondary | ICD-10-CM | POA: Diagnosis present

## 2016-12-27 DIAGNOSIS — Z880 Allergy status to penicillin: Secondary | ICD-10-CM | POA: Diagnosis not present

## 2016-12-27 DIAGNOSIS — I214 Non-ST elevation (NSTEMI) myocardial infarction: Principal | ICD-10-CM

## 2016-12-27 DIAGNOSIS — E785 Hyperlipidemia, unspecified: Secondary | ICD-10-CM | POA: Diagnosis present

## 2016-12-27 DIAGNOSIS — J189 Pneumonia, unspecified organism: Secondary | ICD-10-CM | POA: Diagnosis present

## 2016-12-27 DIAGNOSIS — J449 Chronic obstructive pulmonary disease, unspecified: Secondary | ICD-10-CM | POA: Diagnosis present

## 2016-12-27 DIAGNOSIS — Z87891 Personal history of nicotine dependence: Secondary | ICD-10-CM

## 2016-12-27 DIAGNOSIS — I5030 Unspecified diastolic (congestive) heart failure: Secondary | ICD-10-CM

## 2016-12-27 HISTORY — PX: CORONARY STENT INTERVENTION: CATH118234

## 2016-12-27 HISTORY — DX: Unspecified asthma, uncomplicated: J45.909

## 2016-12-27 HISTORY — PX: INTRAVASCULAR ULTRASOUND/IVUS: CATH118244

## 2016-12-27 HISTORY — PX: LEFT HEART CATH AND CORONARY ANGIOGRAPHY: CATH118249

## 2016-12-27 LAB — DIFFERENTIAL
Basophils Absolute: 0 10*3/uL (ref 0–0.1)
Basophils Relative: 0 %
EOS PCT: 2 %
Eosinophils Absolute: 0.2 10*3/uL (ref 0–0.7)
LYMPHS ABS: 1.3 10*3/uL (ref 1.0–3.6)
LYMPHS PCT: 12 %
MONO ABS: 0.5 10*3/uL (ref 0.2–0.9)
Monocytes Relative: 4 %
NEUTROS ABS: 8.9 10*3/uL — AB (ref 1.4–6.5)
NEUTROS PCT: 82 %

## 2016-12-27 LAB — CBC
HEMATOCRIT: 42.8 % (ref 35.0–47.0)
HEMOGLOBIN: 14.1 g/dL (ref 12.0–16.0)
MCH: 26.7 pg (ref 26.0–34.0)
MCHC: 32.8 g/dL (ref 32.0–36.0)
MCV: 81.5 fL (ref 80.0–100.0)
PLATELETS: 208 10*3/uL (ref 150–440)
RBC: 5.26 MIL/uL — AB (ref 3.80–5.20)
RDW: 15.2 % — ABNORMAL HIGH (ref 11.5–14.5)
WBC: 10.7 10*3/uL (ref 3.6–11.0)

## 2016-12-27 LAB — TROPONIN I
TROPONIN I: 10.39 ng/mL — AB (ref <0.03)
Troponin I: 1.67 ng/mL (ref <0.03)
Troponin I: 15.33 ng/mL (ref <0.03)

## 2016-12-27 LAB — BASIC METABOLIC PANEL
ANION GAP: 7 (ref 5–15)
BUN: 12 mg/dL (ref 6–20)
CHLORIDE: 107 mmol/L (ref 101–111)
CO2: 24 mmol/L (ref 22–32)
CREATININE: 0.82 mg/dL (ref 0.44–1.00)
Calcium: 8.6 mg/dL — ABNORMAL LOW (ref 8.9–10.3)
GFR calc non Af Amer: >60 mL/min (ref >60)
Glucose, Bld: 146 mg/dL — ABNORMAL HIGH (ref 65–99)
POTASSIUM: 4.3 mmol/L (ref 3.5–5.1)
SODIUM: 138 mmol/L (ref 135–145)

## 2016-12-27 LAB — LACTIC ACID, PLASMA
LACTIC ACID, VENOUS: 2.8 mmol/L — AB (ref 0.5–1.9)
LACTIC ACID, VENOUS: 2.7 mmol/L — AB (ref 0.5–1.9)

## 2016-12-27 LAB — ECHOCARDIOGRAM COMPLETE
HEIGHTINCHES: 71 in
WEIGHTICAEL: 3680 [oz_av]

## 2016-12-27 LAB — PROTIME-INR
INR: 0.87
Prothrombin Time: 11.7 seconds (ref 11.4–15.2)

## 2016-12-27 LAB — POCT ACTIVATED CLOTTING TIME: Activated Clotting Time: 241 seconds

## 2016-12-27 LAB — INFLUENZA PANEL BY PCR (TYPE A & B)
INFLBPCR: NEGATIVE
Influenza A By PCR: NEGATIVE

## 2016-12-27 LAB — MAGNESIUM: MAGNESIUM: 2.1 mg/dL (ref 1.7–2.4)

## 2016-12-27 LAB — GLUCOSE, CAPILLARY: GLUCOSE-CAPILLARY: 135 mg/dL — AB (ref 65–99)

## 2016-12-27 LAB — MRSA PCR SCREENING: MRSA by PCR: POSITIVE — AB

## 2016-12-27 LAB — APTT: aPTT: <24 seconds — ABNORMAL LOW (ref 24–36)

## 2016-12-27 SURGERY — LEFT HEART CATH AND CORONARY ANGIOGRAPHY
Anesthesia: Moderate Sedation

## 2016-12-27 MED ORDER — SODIUM CHLORIDE 0.9 % IV SOLN
INTRAVENOUS | Status: DC
  Administered 2016-12-28: 05:00:00 via INTRAVENOUS

## 2016-12-27 MED ORDER — ASPIRIN 81 MG PO CHEW
324.0000 mg | CHEWABLE_TABLET | Freq: Once | ORAL | Status: AC
  Administered 2016-12-27: 324 mg via ORAL
  Filled 2016-12-27: qty 4

## 2016-12-27 MED ORDER — ATORVASTATIN CALCIUM 20 MG PO TABS
40.0000 mg | ORAL_TABLET | Freq: Every day | ORAL | Status: DC
  Administered 2016-12-28: 40 mg via ORAL
  Filled 2016-12-27: qty 2

## 2016-12-27 MED ORDER — ONDANSETRON HCL 4 MG/2ML IJ SOLN
4.0000 mg | Freq: Once | INTRAMUSCULAR | Status: AC
  Administered 2016-12-27: 4 mg via INTRAVENOUS

## 2016-12-27 MED ORDER — ONDANSETRON HCL 4 MG PO TABS: 4.0000 mg | ORAL_TABLET | Freq: Four times a day (QID) | ORAL | Status: DC | PRN

## 2016-12-27 MED ORDER — SODIUM CHLORIDE 0.9 % IV SOLN
INTRAVENOUS | Status: DC
  Administered 2016-12-27: 12:00:00 via INTRAVENOUS

## 2016-12-27 MED ORDER — METOPROLOL TARTRATE 25 MG PO TABS
25.0000 mg | ORAL_TABLET | Freq: Two times a day (BID) | ORAL | Status: DC
  Administered 2016-12-27: 25 mg via ORAL
  Filled 2016-12-27: qty 1

## 2016-12-27 MED ORDER — HEPARIN (PORCINE) IN NACL 2-0.9 UNIT/ML-% IJ SOLN
INTRAMUSCULAR | Status: AC
  Filled 2016-12-27: qty 500

## 2016-12-27 MED ORDER — ONDANSETRON HCL 4 MG/2ML IJ SOLN
INTRAMUSCULAR | Status: AC
  Filled 2016-12-27: qty 2

## 2016-12-27 MED ORDER — TICAGRELOR 90 MG PO TABS
ORAL_TABLET | ORAL | Status: DC | PRN
  Administered 2016-12-27: 180 mg via ORAL

## 2016-12-27 MED ORDER — NITROGLYCERIN 1 MG/10 ML FOR IR/CATH LAB
INTRA_ARTERIAL | Status: DC | PRN
  Administered 2016-12-27: 200 ug via INTRACORONARY

## 2016-12-27 MED ORDER — ASPIRIN 81 MG PO CHEW
81.0000 mg | CHEWABLE_TABLET | Freq: Every day | ORAL | Status: DC
  Administered 2016-12-28 – 2016-12-29 (×2): 81 mg via ORAL
  Filled 2016-12-27 (×2): qty 1

## 2016-12-27 MED ORDER — SODIUM CHLORIDE 0.9 % IV SOLN: 250.0000 mL | INTRAVENOUS | Status: DC | PRN

## 2016-12-27 MED ORDER — ASPIRIN 325 MG PO TABS: 325.0000 mg | ORAL_TABLET | Freq: Every day | ORAL | Status: DC

## 2016-12-27 MED ORDER — ALBUTEROL SULFATE (2.5 MG/3ML) 0.083% IN NEBU: 2.5000 mg | INHALATION_SOLUTION | RESPIRATORY_TRACT | Status: DC | PRN

## 2016-12-27 MED ORDER — ENOXAPARIN SODIUM 40 MG/0.4ML ~~LOC~~ SOLN
40.0000 mg | SUBCUTANEOUS | Status: DC
  Administered 2016-12-28 – 2016-12-29 (×2): 40 mg via SUBCUTANEOUS
  Filled 2016-12-27 (×2): qty 0.4

## 2016-12-27 MED ORDER — CARVEDILOL 3.125 MG PO TABS
3.1250 mg | ORAL_TABLET | Freq: Two times a day (BID) | ORAL | Status: DC
  Administered 2016-12-28 – 2016-12-29 (×3): 3.125 mg via ORAL
  Filled 2016-12-27 (×3): qty 1

## 2016-12-27 MED ORDER — SENNOSIDES-DOCUSATE SODIUM 8.6-50 MG PO TABS: 1.0000 | ORAL_TABLET | Freq: Every evening | ORAL | Status: DC | PRN

## 2016-12-27 MED ORDER — LIDOCAINE HCL (PF) 1 % IJ SOLN
INTRAMUSCULAR | Status: AC
  Filled 2016-12-27: qty 30

## 2016-12-27 MED ORDER — KETOROLAC TROMETHAMINE 30 MG/ML IJ SOLN: 30.0000 mg | Freq: Four times a day (QID) | INTRAMUSCULAR | Status: DC | PRN

## 2016-12-27 MED ORDER — HEPARIN SODIUM (PORCINE) 1000 UNIT/ML IJ SOLN
INTRAMUSCULAR | Status: AC
  Filled 2016-12-27: qty 1

## 2016-12-27 MED ORDER — LEVALBUTEROL HCL 0.63 MG/3ML IN NEBU
0.6300 mg | INHALATION_SOLUTION | Freq: Once | RESPIRATORY_TRACT | Status: AC
  Administered 2016-12-27: 0.63 mg via RESPIRATORY_TRACT
  Filled 2016-12-27: qty 3

## 2016-12-27 MED ORDER — SODIUM CHLORIDE 0.9% FLUSH: 3.0000 mL | INTRAVENOUS | Status: DC | PRN

## 2016-12-27 MED ORDER — GUAIFENESIN 100 MG/5ML PO SOLN
5.0000 mL | ORAL | Status: DC | PRN
  Filled 2016-12-27: qty 5

## 2016-12-27 MED ORDER — LEVOFLOXACIN IN D5W 750 MG/150ML IV SOLN
750.0000 mg | INTRAVENOUS | Status: DC
  Administered 2016-12-28: 750 mg via INTRAVENOUS
  Filled 2016-12-27: qty 150

## 2016-12-27 MED ORDER — VANCOMYCIN HCL IN DEXTROSE 1-5 GM/200ML-% IV SOLN
1000.0000 mg | Freq: Once | INTRAVENOUS | Status: DC
  Filled 2016-12-27: qty 200

## 2016-12-27 MED ORDER — LEVOFLOXACIN IN D5W 750 MG/150ML IV SOLN
750.0000 mg | Freq: Once | INTRAVENOUS | Status: AC
  Administered 2016-12-27: 750 mg via INTRAVENOUS
  Filled 2016-12-27: qty 150

## 2016-12-27 MED ORDER — ACETAMINOPHEN 325 MG PO TABS: 650.0000 mg | ORAL_TABLET | Freq: Four times a day (QID) | ORAL | Status: DC | PRN

## 2016-12-27 MED ORDER — FUROSEMIDE 10 MG/ML IJ SOLN
INTRAMUSCULAR | Status: AC
  Filled 2016-12-27: qty 4

## 2016-12-27 MED ORDER — TICAGRELOR 90 MG PO TABS
ORAL_TABLET | ORAL | Status: AC
Start: 2016-12-27 — End: 2016-12-27
  Filled 2016-12-27: qty 2

## 2016-12-27 MED ORDER — SODIUM CHLORIDE 0.9% FLUSH
3.0000 mL | Freq: Two times a day (BID) | INTRAVENOUS | Status: DC
  Administered 2016-12-27 – 2016-12-28 (×3): 3 mL via INTRAVENOUS

## 2016-12-27 MED ORDER — ACETAMINOPHEN 650 MG RE SUPP: 650.0000 mg | Freq: Four times a day (QID) | RECTAL | Status: DC | PRN

## 2016-12-27 MED ORDER — VERAPAMIL HCL 2.5 MG/ML IV SOLN
INTRAVENOUS | Status: AC
  Filled 2016-12-27: qty 2

## 2016-12-27 MED ORDER — HEPARIN (PORCINE) IN NACL 100-0.45 UNIT/ML-% IJ SOLN
1100.0000 [IU]/h | INTRAMUSCULAR | Status: DC
  Administered 2016-12-27: 1100 [IU]/h via INTRAVENOUS
  Filled 2016-12-27: qty 250

## 2016-12-27 MED ORDER — SODIUM CHLORIDE 0.9 % IV BOLUS (SEPSIS)
1000.0000 mL | Freq: Once | INTRAVENOUS | Status: AC
  Administered 2016-12-27: 1000 mL via INTRAVENOUS

## 2016-12-27 MED ORDER — NICOTINE 21 MG/24HR TD PT24: 21.0000 mg | MEDICATED_PATCH | Freq: Every day | TRANSDERMAL | Status: DC

## 2016-12-27 MED ORDER — HEPARIN BOLUS VIA INFUSION
4000.0000 [IU] | Freq: Once | INTRAVENOUS | Status: AC
  Administered 2016-12-27: 4000 [IU] via INTRAVENOUS
  Filled 2016-12-27: qty 4000

## 2016-12-27 MED ORDER — MIDAZOLAM HCL 2 MG/2ML IJ SOLN
INTRAMUSCULAR | Status: DC | PRN
  Administered 2016-12-27: 1 mg via INTRAVENOUS

## 2016-12-27 MED ORDER — HYDROCODONE-ACETAMINOPHEN 5-325 MG PO TABS: 1.0000 | ORAL_TABLET | ORAL | Status: DC | PRN

## 2016-12-27 MED ORDER — HEPARIN SODIUM (PORCINE) 1000 UNIT/ML IJ SOLN
INTRAMUSCULAR | Status: DC | PRN
  Administered 2016-12-27: 5000 [IU] via INTRAVENOUS
  Administered 2016-12-27: 2000 [IU] via INTRAVENOUS
  Administered 2016-12-27: 5000 [IU] via INTRAVENOUS

## 2016-12-27 MED ORDER — IOPAMIDOL (ISOVUE-300) INJECTION 61%
INTRAVENOUS | Status: DC | PRN
  Administered 2016-12-27: 115 mL via INTRA_ARTERIAL

## 2016-12-27 MED ORDER — FUROSEMIDE 10 MG/ML IJ SOLN
INTRAMUSCULAR | Status: DC | PRN
  Administered 2016-12-27: 40 mg via INTRAVENOUS

## 2016-12-27 MED ORDER — MIDAZOLAM HCL 2 MG/2ML IJ SOLN
INTRAMUSCULAR | Status: AC
  Filled 2016-12-27: qty 2

## 2016-12-27 MED ORDER — LOSARTAN POTASSIUM 25 MG PO TABS
25.0000 mg | ORAL_TABLET | Freq: Every day | ORAL | Status: DC
  Administered 2016-12-28 – 2016-12-29 (×2): 25 mg via ORAL
  Filled 2016-12-27 (×2): qty 1

## 2016-12-27 MED ORDER — ONDANSETRON HCL 4 MG/2ML IJ SOLN
4.0000 mg | Freq: Four times a day (QID) | INTRAMUSCULAR | Status: DC | PRN
  Administered 2016-12-27: 4 mg via INTRAVENOUS

## 2016-12-27 MED ORDER — TICAGRELOR 90 MG PO TABS
90.0000 mg | ORAL_TABLET | Freq: Two times a day (BID) | ORAL | Status: DC
  Administered 2016-12-28 – 2016-12-29 (×3): 90 mg via ORAL
  Filled 2016-12-27 (×3): qty 1

## 2016-12-27 MED ORDER — BISACODYL 5 MG PO TBEC: 5.0000 mg | DELAYED_RELEASE_TABLET | Freq: Every day | ORAL | Status: DC | PRN

## 2016-12-27 MED ORDER — SODIUM CHLORIDE 0.9% FLUSH
3.0000 mL | Freq: Two times a day (BID) | INTRAVENOUS | Status: DC
  Administered 2016-12-27: 3 mL via INTRAVENOUS

## 2016-12-27 MED ORDER — FENTANYL CITRATE (PF) 100 MCG/2ML IJ SOLN
INTRAMUSCULAR | Status: AC
  Filled 2016-12-27: qty 2

## 2016-12-27 MED ORDER — NITROGLYCERIN 5 MG/ML IV SOLN
INTRAVENOUS | Status: AC
  Filled 2016-12-27: qty 10

## 2016-12-27 SURGICAL SUPPLY — 11 items
CATH EAGLE EYE PLAT IMAGING (CATHETERS) ×2 IMPLANT
CATH OPTITORQUE JACKY 4.0 5F (CATHETERS) ×2 IMPLANT
CATH VISTA GUIDE 6FR JL3.5 (CATHETERS) ×2 IMPLANT
DEVICE INFLAT 30 PLUS (MISCELLANEOUS) ×2 IMPLANT
DEVICE RAD TR BAND REGULAR (VASCULAR PRODUCTS) ×2 IMPLANT
GLIDESHEATH SLEND SS 6F .021 (SHEATH) ×2 IMPLANT
KIT MANI 3VAL PERCEP (MISCELLANEOUS) ×2 IMPLANT
PACK CARDIAC CATH (CUSTOM PROCEDURE TRAY) ×2 IMPLANT
STENT SIERRA 3.00 X 18 MM (Permanent Stent) ×2 IMPLANT
WIRE ROSEN-J .035X260CM (WIRE) ×2 IMPLANT
WIRE RUNTHROUGH .014X180CM (WIRE) ×2 IMPLANT

## 2016-12-27 NOTE — Plan of Care (Signed)
  Progressing Education: Knowledge of General Education information will improve 12/27/2016 1647 - Progressing by Rigoberto Noel, RN Health Behavior/Discharge Planning: Ability to manage health-related needs will improve 12/27/2016 1647 - Progressing by Rigoberto Noel, RN Clinical Measurements: Ability to maintain clinical measurements within normal limits will improve 12/27/2016 1647 - Progressing by Rigoberto Noel, RN

## 2016-12-27 NOTE — ED Triage Notes (Addendum)
SOB X2 days progressively gotten worse. HX asthma and chronic bronchitis. EMS reports room air sats 88%. EMS 135/100, HR 122 sinus rhythm. EMS administered 2 duo nebs. Sats 100% with 15L. Patient reports she does not normally wear O2 at home.

## 2016-12-27 NOTE — OR Nursing (Signed)
Received from cath lab nauseated, feeling hot, female urinal placed r/t to 40 mg IV lasix given in cath lab..I have been unable to reach this patient by phone.  A letter is being sent. To use urinal...requesting to use a potty...sat up on potty...zofran 4 mg given iv .Marland Kitchen.12 lead ekg done, ...denies having chest pain

## 2016-12-27 NOTE — Consult Note (Signed)
ANTICOAGULATION CONSULT NOTE - Initial Consult  Pharmacy Consult for Heparin Drip Dosing and Monitoring   Indication: chest pain/ACS  Allergies  Allergen Reactions  . Penicillins Rash    Has patient had a PCN reaction causing immediate rash, facial/tongue/throat swelling, SOB If all of the above answers are "NO", then may proceed with Cephalosporin use.   or lightheadedness with hypotension:unsure Has patient had a PCN reaction causing severe rash involving mucus membranes or skin necrosis:unsure Has patient had a PCN reaction that required hospitalization:unsure Has patient had a PCN reaction occurring within the last 10 years:unsure  Childhood reaction    Patient Measurements: Height: 5\' 11"  (180.3 cm) Weight: 230 lb (104.3 kg) IBW/kg (Calculated) : 70.8 Heparin Dosing Weight: 93kg  Vital Signs:    Labs: Recent Labs    12/27/16 0755  HGB 14.1  HCT 42.8  PLT 208  CREATININE 0.82  TROPONINI 1.67*    Estimated Creatinine Clearance: 100.6 mL/min (by C-G formula based on SCr of 0.82 mg/dL).   Medical History: Past Medical History:  Diagnosis Date  . Asthma   . Bronchitis, chronic (HCC)    PT CURRENTLY HAVING NO ISSUES WITH BRONCHITIS (11-15-15)     Assessment: Pharmacy consulted for heparin drip dosing and monitoring in 56 yo female diagnosis of NSTEMI.   Goal of Therapy:  Heparin level 0.3-0.7 units/ml Monitor platelets by anticoagulation protocol: Yes   Plan:  Dosing weight= 93kg Baseline labs ordered  Give 4000 units bolus x 1 Start heparin infusion at 1100 units/hr Check anti-Xa level in 6 hours and daily while on heparin Continue to monitor H&H and platelets  Gardner Candle, PharmD, BCPS Clinical Pharmacist 12/27/2016 9:21 AM

## 2016-12-27 NOTE — Consult Note (Signed)
Cardiology Consultation:   Patient ID: Lindsey Gibson; [id number]; 04/16/1960   Admit date: 12/27/2016 Date of Consult: 12/27/2016  Primary Care Provider: Patient, No Pcp Per Primary Cardiologist: New to [facility] - consult by Arida   Patient Profile:   Lindsey Gibson is a 56 y.o. female with a hx of COPD 2/2 prior tobacco abuse, asthma, osteoarthritis who is being seen today for the evaluation of elevated troponin at the request of Dr. Imogene Burn.  History of Present Illness:   Lindsey Gibson has no previously known cardiac history. She presented to [facility] on 12/27 with increased nasal congestion for 3-4 days that was improving. Never with cough, sore throat, sinus pressure, rhinorrhea, fever, chills, or myalgias. She woke up this morning and felt, "fine." Upon walking into the kitchen she developed sudden onset of SOB. Never with chest pain. No recent sedentary periods, vascular trauma, or known hypercoagulable state. No known clotting disorders. No swelling, erythema, warmth, tenderness, or cording of the lower extremities. Upon her arrival to [facility], she was found to have bilateral CAP on CXR. She initially required 6 L via nasal cannula, though has since been weaned down to 3 L via [place]. Troponin was noted to be elevated at 1.67 and has since trended to 15.33. She has remained chest pain free. Flu negative. WBC 10.7, HGb 14.1, PLT 208, lactic acid 2.8, SCr 0.82, K+ 4.3 with hemolysis. EKG as below. She was given ASA and started on heparin in the ED. She has also been started on Levaquin per IM. She remains on 2 L via nasal cannula and reports her SOB is somewhat improved. Continues to be chest pain free.   Past Medical History:  Diagnosis Date  . Asthma   . Bronchitis, chronic (HCC)    PT CURRENTLY HAVING NO ISSUES WITH BRONCHITIS (11-15-15)    Past Surgical History:  Procedure Laterality Date  . KNEE ARTHROSCOPY Right   . SHOULDER ARTHROSCOPY WITH SUBACROMIAL DECOMPRESSION Right 11/22/2015     Procedure: SHOULDER ARTHROSCOPY WITH DEBRIDEMENT, DECOMPRESSION  AND BICEPS TENODESIS;  Surgeon: Christena Flake, MD;  Location: [facility] ORS;  Service: Orthopedics;  Laterality: Right;  . TONSILLECTOMY    . ULNAR NERVE TRANSPOSITION Right 11/22/2015   Procedure: ULNAR NERVE DECOMPRESSION/TRANSPOSITION;  Surgeon: Christena Flake, MD;  Location: [facility] ORS;  Service: Orthopedics;  Laterality: Right;     Home Meds: Prior to Admission medications   Medication Sig Start Date End Date Taking? Authorizing Provider  ibuprofen (ADVIL,MOTRIN) 800 MG tablet Take 1 tablet (800 mg total) by mouth every 8 (eight) hours as needed. Patient not taking: Reported on 12/27/2016 07/01/15   Menshew, Charlesetta Ivory, PA-C  oxyCODONE (ROXICODONE) 5 MG immediate release tablet Take 1-2 tablets (5-10 mg total) by mouth every 4 (four) hours as needed for severe pain. Patient not taking: Reported on 12/27/2016 11/22/15   Poggi, Excell Seltzer, MD    Inpatient Medications: Scheduled Meds: . [START ON 12/28/2016] aspirin  325 mg Oral Daily  . atorvastatin  40 mg Oral q1800  . metoprolol tartrate  25 mg Oral BID  . nicotine  21 mg Transdermal Daily   Continuous Infusions: . sodium chloride 100 mL/hr at 12/27/16 1145  . heparin 1,100 Units/hr (12/27/16 0943)  . [START ON 12/28/2016] levofloxacin (LEVAQUIN) IV     PRN Meds: acetaminophen **OR** acetaminophen, albuterol, bisacodyl, guaiFENesin, HYDROcodone-acetaminophen, ketorolac, ondansetron **OR** ondansetron (ZOFRAN) IV, senna-docusate  Allergies:   Allergies  Allergen Reactions  . Penicillins Rash  Has patient had a PCN reaction causing immediate rash, facial/tongue/throat swelling, SOB If all of the above answers are "NO", then may proceed with Cephalosporin use.   or lightheadedness with hypotension:unsure Has patient had a PCN reaction causing severe rash involving mucus membranes or skin necrosis:unsure Has patient had a PCN reaction that required  hospitalization:unsure Has patient had a PCN reaction occurring within the last 10 years:unsure  Childhood reaction    Social History:   Social History   Socioeconomic History  . Marital status: Married    Spouse name: Not on file  . Number of children: Not on file  . Years of education: Not on file  . Highest education level: Not on file  Social Needs  . Financial resource strain: Not on file  . Food insecurity - worry: Not on file  . Food insecurity - inability: Not on file  . Transportation needs - medical: Not on file  . Transportation needs - non-medical: Not on file  Occupational History  . Not on file  Tobacco Use  . Smoking status: Former Smoker    Packs/day: 1.00    Years: 12.00    Pack years: 12.00    Types: Cigarettes  . Smokeless tobacco: Never Used  Substance and Sexual Activity  . Alcohol use: No  . Drug use: No  . Sexual activity: Not on file  Other Topics Concern  . Not on file  Social History Narrative  . Not on file     Family History:  Family History  Problem Relation Age of Onset  . Heart disease Mother   . Diabetes Mother   . Diabetes Sister   . Diabetes Brother     ROS:  Review of Systems  Constitutional: Positive for malaise/fatigue. Negative for chills, diaphoresis, fever and weight loss.  HENT: Positive for congestion and sinus pain.   Eyes: Negative for discharge and redness.  Respiratory: Positive for shortness of breath. Negative for cough, hemoptysis, sputum production and wheezing.   Cardiovascular: Negative for chest pain, palpitations, orthopnea, claudication, leg swelling and PND.  Gastrointestinal: Negative for abdominal pain, blood in stool, heartburn, melena, nausea and vomiting.  Genitourinary: Negative for hematuria.  Musculoskeletal: Negative for falls and myalgias.  Skin: Negative for rash.  Neurological: Negative for dizziness, tingling, tremors, sensory change, speech change, focal weakness, loss of consciousness  and weakness.  Endo/Heme/Allergies: Does not bruise/bleed easily.  Psychiatric/Behavioral: Negative for substance abuse. The patient is not nervous/anxious.   All other systems reviewed and are negative.     Physical Exam/Data:   Vitals:   12/27/16 0900 12/27/16 1000 12/27/16 1107 12/27/16 1135  BP:   103/80 133/85  Pulse: (!) 110 (!) 116 (!) 117 (!) 107  Resp: 17 13 17  (!) 22  Temp:    97.7 F (36.5 C)  TempSrc:    Oral  SpO2: 99% 100% 99% 100%  Weight:      Height:       No intake or output data in the 24 hours ending 12/27/16 1223 Filed Weights   12/27/16 0756  Weight: 230 lb (104.3 kg)   Body mass index is 32.08 kg/m.   Physical Exam: General: Well developed, well nourished, in no acute distress. Head: Normocephalic, atraumatic, sclera non-icteric, no xanthomas, nares without discharge.  Neck: Negative for carotid bruits. JVD not elevated. Lungs: Diminished breath sounds bilaterally with faint bilateral rhonchi. Breathing is unlabored. On 2 L via nasal cannula.  Heart: Tachycardic with S1 S2. No murmurs, rubs, or  gallops appreciated. Abdomen: Soft, non-tender, non-distended with normoactive bowel sounds. No hepatomegaly. No rebound/guarding. No obvious abdominal masses. Msk:  Strength and tone appear normal for age. Extremities: No clubbing or cyanosis. No edema. Distal pedal pulses are 2+ and equal bilaterally. Neuro: Alert and oriented X 3. No facial asymmetry. No focal deficit. Moves all extremities spontaneously. Psych:  Responds to questions appropriately with a normal affect.   EKG:  The EKG was personally reviewed and demonstrates: sinus tachycardia, 128 bpm, nonspecific inferior and anterior st elevation, not meeting STEMI criteria  Telemetry:  Telemetry was personally reviewed and demonstrates: sinus tachycardia, 110s to 120s bpm  Weights: Filed Weights   12/27/16 0756  Weight: 230 lb (104.3 kg)    Relevant CV Studies: TTE pending  Laboratory  Data:  Chemistry Recent Labs  Lab 12/27/16 0755  NA 138  K 4.3  CL 107  CO2 24  GLUCOSE 146*  BUN 12  CREATININE 0.82  CALCIUM 8.6*  GFRNONAA >60  GFRAA >60  ANIONGAP 7    No results for input(s): PROT, ALBUMIN, AST, ALT, ALKPHOS, BILITOT in the last 168 hours. Hematology Recent Labs  Lab 12/27/16 0755  WBC 10.7  RBC 5.26*  HGB 14.1  HCT 42.8  MCV 81.5  MCH 26.7  MCHC 32.8  RDW 15.2*  PLT 208   Cardiac Enzymes Recent Labs  Lab 12/27/16 0755  TROPONINI 1.67*   No results for input(s): TROPIPOC in the last 168 hours.  BNPNo results for input(s): BNP, PROBNP in the last 168 hours.  DDimer No results for input(s): DDIMER in the last 168 hours.  Radiology/Studies:  Dg Chest 2 View  Result Date: 12/27/2016 IMPRESSION: Widespread bilateral pneumonia without dense consolidation or collapse. Electronically Signed   By: Paulina Fusi M.D.   On: 12/27/2016 08:41    Assessment and Plan:   1. NSTEMI: -Anterior ST segment elevation does not meet criteria for STEMI  -Never with chest pain  -Continue to cycle until peaks -2nd troponin elevated at 15.33 -Continues to be without chest pain, check stat EKG -Continue heparin gtt -Stat echo, discussed with Dorene Sorrow -Stat echo showed EF 25-30% with AK of the anterior wall -Discussed with Dr. Kirke Corin, plan for emergent LHC at this time -ASA  -Will avoid beta blockers at this time given her asthma history and acute pulmonary illness  -Check lipid and A1c for further risk stratification   2. Acute respiratory distress with hypoxia/sepsis from possible CAP/AECOPD:  -Continues to have sinus tachycardia with heart rates in the 110s to 120s bpm and require supplemental oxygen at 2 L via nasal cannula -LHC as above -ABX per IM -Discussed with IM MD -Wean O2 as able per IM  -ABX and nebs per IM   3. Dispo: -Verified her DNR status with the patient and her daughter    For questions or updates, please contact [facility] Please consult [email or web address] for contact info under Cardiology/STEMI.   Signed, Eula Listen, PA-C [facility] Pager: [phone] 12/27/2016, 12:23 PM

## 2016-12-27 NOTE — Progress Notes (Signed)
CRITICAL VALUE ALERT  Critical Value:  Troponin 15.33 (second troponin)  Date & Time Notied:  12/27/16.1245  Provider Notified: Eula Listen, PA  Orders Received/Actions taken: STAT EKG, STAT echo. Will continue to monitor patient.

## 2016-12-27 NOTE — Progress Notes (Addendum)
Progress Note  Patient Name: Lindsey Gibson Date of Encounter: 12/28/2016  Primary Cardiologist: No primary care provider on file.   Subjective   No chest pain. No further SOB. Breathing back to baseline. Successful PCI/DES to the mid LAD as detailed below. Net - 2 L for the admission. Tolerating medications. BP stable. Feels like she "can run around this hospital."    Inpatient Medications    Scheduled Meds: . aspirin  81 mg Oral Daily  . atorvastatin  40 mg Oral q1800  . carvedilol  3.125 mg Oral BID WC  . enoxaparin (LOVENOX) injection  40 mg Subcutaneous Q24H  . losartan  25 mg Oral Daily  . nicotine  21 mg Transdermal Daily  . sodium chloride flush  3 mL Intravenous Q12H  . sodium chloride flush  3 mL Intravenous Q12H  . ticagrelor  90 mg Oral BID   Continuous Infusions: . sodium chloride    . sodium chloride 10 mL/hr at 12/28/16 0730  . sodium chloride    . levofloxacin (LEVAQUIN) IV     PRN Meds: sodium chloride, sodium chloride, acetaminophen **OR** acetaminophen, albuterol, bisacodyl, guaiFENesin, HYDROcodone-acetaminophen, ketorolac, ondansetron **OR** ondansetron (ZOFRAN) IV, senna-docusate, sodium chloride flush, sodium chloride flush   Vital Signs    Vitals:   12/28/16 0500 12/28/16 0600 12/28/16 0700 12/28/16 0730  BP: 108/76 118/80 117/72 117/72  Pulse: 81 85 81 85  Resp: 16 19 20 18   Temp:    (!) 97.5 F (36.4 C)  TempSrc:    Oral  SpO2: 99% 96% 98% 99%  Weight:      Height:        Intake/Output Summary (Last 24 hours) at 12/28/2016 0814 Last data filed at 12/28/2016 0730 Gross per 24 hour  Intake 22.5 ml  Output 2050 ml  Net -2027.5 ml   Filed Weights   12/27/16 0756 12/27/16 1739  Weight: 230 lb (104.3 kg) 252 lb 3.3 oz (114.4 kg)    Telemetry    NSR - Personally Reviewed  ECG    n/a - Personally Reviewed  Physical Exam   GEN: No acute distress.   Neck: No JVD Cardiac: RRR, no murmurs, rubs, or gallops. Right radial cath  site without bleeding, bruising, swelling, erythema, or TTP. Radial pulse 2+.  Respiratory: Clear to auscultation bilaterally. GI: Soft, nontender, non-distended  MS: No edema; No deformity. Neuro:  Nonfocal  Psych: Normal affect   Labs    Chemistry Recent Labs  Lab 12/27/16 0755 12/28/16 0642  NA 138 137  K 4.3 3.5  CL 107 104  CO2 24 26  GLUCOSE 146* 112*  BUN 12 12  CREATININE 0.82 0.80  CALCIUM 8.6* 8.5*  GFRNONAA >60 >60  GFRAA >60 >60  ANIONGAP 7 7     Hematology Recent Labs  Lab 12/27/16 0755 12/28/16 0642  WBC 10.7 6.1  RBC 5.26* 4.60  HGB 14.1 12.2  HCT 42.8 36.9  MCV 81.5 80.2  MCH 26.7 26.5  MCHC 32.8 33.1  RDW 15.2* 15.2*  PLT 208 202    Cardiac Enzymes Recent Labs  Lab 12/27/16 0755 12/27/16 1146 12/27/16 1846  TROPONINI 1.67* 15.33* 10.39*   No results for input(s): TROPIPOC in the last 168 hours.   BNPNo results for input(s): BNP, PROBNP in the last 168 hours.   DDimer No results for input(s): DDIMER in the last 168 hours.   Radiology    Dg Chest 2 View  Result Date: 12/27/2016 IMPRESSION: Widespread bilateral pneumonia  without dense consolidation or collapse. Electronically Signed   By: Paulina Fusi M.D.   On: 12/27/2016 08:41    Cardiac Studies  LHC 12/27/16:  Mid LAD lesion is 70% stenosed.  Ost LAD to Prox LAD lesion is 30% stenosed.  Prox Cx lesion is 30% stenosed.  A drug-eluting stent was successfully placed using a STENT SIERRA 3.00 X 18 MM.  Post intervention, there is a 0% residual stenosis.   1.  Severe one-vessel coronary artery disease with 70% stenosis in the mid LAD which was significant by IVUS with evidence of plaque rupture in that area.  This is less likely culprit for myocardial infarction.  No other obstructive disease. 2.  Severely elevated left ventricular end-diastolic pressure at 32 mmHg.  Left ventricular angiography was not performed.  EF was severely reduced by echo. 3.  Successful direct  stenting of the mid LAD.  TTE 12/27/16: Study Conclusions  - Left ventricle: The cavity size was normal. There was mild   concentric hypertrophy. Systolic function was severely reduced.   The estimated ejection fraction was in the range of 20% to 25%.   There is akinesis of the mid-apicalanteroseptal, anterior, and   apical myocardium. Doppler parameters are consistent with   abnormal left ventricular relaxation (grade 1 diastolic   dysfunction). - Aortic valve: There was trivial regurgitation. - Left atrium: The atrium was mildly dilated.  Patient Profile     Lindsey Gibson is a 56 y.o. female with a hx of COPD 2/2 prior tobacco abuse, asthma, and osteoarthritis, who presented to [facility] on 12/27 with acute respiratory distress and was found to have a NSTEMI, s/p successful PCI/DES to the mid LAD as above.   Assessment & Plan    1. NSTEMI: -Never with chest pain -Presenting symptom of respiratory distress with pulmonary edema -Status post cardiac catheterization 12/27/2016 that showed plaque rupture in the mid LAD, confirmed with IVUS -Successful PCI/DES to the mid LAD -DAPT with ASA and Brilinta for the first 30 days, thereafter, she will need to transition to Plavix 75 mg daily with ASA as she does not have insurance -Case management has been consulted to assist with medications and Brilinta card -Cardiac rehab  2. Pulmonary edema/acute systolic CHF/ICM: -IV Lasix this morning with KCl repletion -Continue Coreg and losartan, BP precludes further titration at this time -Add spironolactone if BP allows prior to discharge -Will likely need PO Lasix at discharge -CHF education -Daily weights, strict Is and Os -Consider repeat echo in ~ 3 months to assess for improvement in EF s/p PCI and on optimal medications -Unlikely to require LifeVest at discharge  3. HLD: -Lipitor -Goal LDL < 70 -Recheck lipids in ~ 6-8 weeks, if she remains above goal at that time consider addition  of Zetia vs PCSK9 inhibitor   4. Acute respiratory distress with hypoxia: -Less likely PNA -More likely pulmonary edema in the setting of her NSTEMI -Diuresis as above -Now on room air -Defer management of ABX to IM  5. Asthma/COPD/prior tobacco abuse: -She requests a refill of her albuterol at discharge, defer to IM for this -Stopped smoking ~ 25 years prior   6. Dispo: -Transfer to telemetry today -Will need to be monitored for another 24 hours at least    For questions or updates, please contact [facility] Please consult [email or web address] for contact info under Cardiology/STEMI.      Signed, Eula Listen, PA-C  12/28/2016, 8:14 AM     Attending Note Patient seen and  examined, agree with detailed note above,  Patient presentation and plan discussed on rounds.   Feels well this morning, no chest pain  Details of presentation discussed with her in detail Results from cardiac catheterization discussed with her in detail Stopped smoking 20 years ago,  Nondiabetic  On physical exam no JVD, lungs clear to auscultation, heart sounds regular with no murmurs rubs gallops, abdomen soft nontender, no significant lower extremity edema  Lab work reviewed troponin XV positive MRSA by PCR trending down to 10 Total cholesterol 177, creatinine 0.80 with potassium 3.5   1. NSTEMI:  symptom of respiratory distress with pulmonary edema on arrival catheterization 12/27/2016 that showed plaque rupture in the mid LAD, confirmed with IVUS Successful PCI/DES to the mid LAD -DAPT with ASA and Brilinta for the first 30 days, then transition to Plavix 75 mg daily with ASA  no insurance -Cardiac rehab though has no insurance, medicaid potential  2. Pulmonary edema/acute systolic CHF/ICM: -IV Lasix this morning with KCl repletion On Coreg and losartan spironolactone if BP allows prior to discharge  at discharge, lasix 20 daily wth potassium CHF education provided -Consider repeat echo in  ~ 3 months to assess for improvement in EF   3. HLD: -Lipitor -Goal LDL < 70  4. Acute respiratory distress with hypoxia:  pulmonary edema in the setting of her NSTEMI -Diuresis as above, elevated wedge on cardiac cath  5. Asthma/COPD/prior tobacco abuse: Stopped smoking ~ 25 years prior   6. Dispo: Will need to be monitored for another 24 hours at least   Long discussion concerning cardiac catheterization results, risk prevention strategies, lifestyle modification, role of medications, recovery, follow-up  Greater than 50% was spent in counseling and coordination of care with patient Total encounter time 35 minutes or more   Signed: Dossie Arbourim Gollan  M.D., Ph.D. [facility]

## 2016-12-27 NOTE — Progress Notes (Signed)
Pt arrived in stable condition. Pt allowed staff to place purewick and raise bed to Chair position to assist with voiding. Will continue to assess.

## 2016-12-27 NOTE — Consult Note (Signed)
Pharmacy Antibiotic Note  Lindsey Gibson is a 56 y.o. female admitted on 12/27/2016 with pneumonia/CAP.  Pharmacy has been consulted for levofloxacin dosing. Patient received 1 dose levofloxacin 750mg  IV x 1 dose in ED.   Plan: Continue levofloxacin 750mg  every 24 hours.   Height: 5\' 11"  (180.3 cm) Weight: 230 lb (104.3 kg) IBW/kg (Calculated) : 70.8  No data recorded.  Recent Labs  Lab 12/27/16 0755  WBC 10.7  CREATININE 0.82    Estimated Creatinine Clearance: 100.6 mL/min (by C-G formula based on SCr of 0.82 mg/dL).    Allergies  Allergen Reactions  . Penicillins Rash    Has patient had a PCN reaction causing immediate rash, facial/tongue/throat swelling, SOB If all of the above answers are "NO", then may proceed with Cephalosporin use.   or lightheadedness with hypotension:unsure Has patient had a PCN reaction causing severe rash involving mucus membranes or skin necrosis:unsure Has patient had a PCN reaction that required hospitalization:unsure Has patient had a PCN reaction occurring within the last 10 years:unsure  Childhood reaction    Antimicrobials this admission: 12/27 levofloxacin >>   Dose adjustments this admission:   Microbiology results: 12/27  BCx: pending  Thank you for allowing pharmacy to be a part of this patient's care.  Gardner Candle, PharmD, BCPS Clinical Pharmacist 12/27/2016 10:20 AM

## 2016-12-27 NOTE — Progress Notes (Signed)
*  PRELIMINARY RESULTS* Echocardiogram 2D Echocardiogram has been performed.  Lindsey Gibson 12/27/2016, 1:24 PM

## 2016-12-27 NOTE — OR Nursing (Signed)
Drop in SBP to 84 ..maxillary assist back to bed..blood pressure bloodpressure  At 90 on recheck, nauseated and diaphortic .Marland KitchenMarland KitchenDr Kirke Corin paged and into see. Patient continues to refuse foly catheter...family at bedside

## 2016-12-27 NOTE — Progress Notes (Signed)
Pt off the floor for cath

## 2016-12-27 NOTE — H&P (Addendum)
[facility] - [place] at [facility]   PATIENT NAME: Lindsey Gibson    MR#:  [id number]  DATE OF BIRTH:  Mar 11, 1960  DATE OF ADMISSION:  12/27/2016  PRIMARY CARE PHYSICIAN: Patient, No Pcp Per   REQUESTING/REFERRING PHYSICIAN: Dr. Shaune Pollack  CHIEF COMPLAINT:   Chief Complaint  Patient presents with  . Shortness of Breath   SOB 3-4 days. HISTORY OF PRESENT ILLNESS:  Lindsey Gibson  is a 56 y.o. female with a known history of asthma.she has had cough, sputum and SOB for 3-4 days.No chest pain or leg edema. She is found hypoxia, put on O2 [place] 6 L, then down to 3 L. CXR: PNA.  PAST MEDICAL HISTORY:   Past Medical History:  Diagnosis Date  . Asthma   . Bronchitis, chronic (HCC)    PT CURRENTLY HAVING NO ISSUES WITH BRONCHITIS (11-15-15)    PAST SURGICAL HISTORY:   Past Surgical History:  Procedure Laterality Date  . KNEE ARTHROSCOPY Right   . SHOULDER ARTHROSCOPY WITH SUBACROMIAL DECOMPRESSION Right 11/22/2015   Procedure: SHOULDER ARTHROSCOPY WITH DEBRIDEMENT, DECOMPRESSION  AND BICEPS TENODESIS;  Surgeon: Christena Flake, MD;  Location: [facility] ORS;  Service: Orthopedics;  Laterality: Right;  . TONSILLECTOMY    . ULNAR NERVE TRANSPOSITION Right 11/22/2015   Procedure: ULNAR NERVE DECOMPRESSION/TRANSPOSITION;  Surgeon: Christena Flake, MD;  Location: [facility] ORS;  Service: Orthopedics;  Laterality: Right;    SOCIAL HISTORY:   Social History   Tobacco Use  . Smoking status: Former Smoker    Packs/day: 1.00    Years: 12.00    Pack years: 12.00    Types: Cigarettes  . Smokeless tobacco: Never Used  Substance Use Topics  . Alcohol use: No    FAMILY HISTORY:   Family History  Problem Relation Age of Onset  . Heart disease Mother   . Diabetes Mother   . Diabetes Sister   . Diabetes Brother    parents deseased. Heart disease.  DRUG ALLERGIES:   Allergies  Allergen Reactions  . Penicillins Rash    Has patient had a PCN reaction causing immediate rash,  facial/tongue/throat swelling, SOB If all of the above answers are "NO", then may proceed with Cephalosporin use.   or lightheadedness with hypotension:unsure Has patient had a PCN reaction causing severe rash involving mucus membranes or skin necrosis:unsure Has patient had a PCN reaction that required hospitalization:unsure Has patient had a PCN reaction occurring within the last 10 years:unsure  Childhood reaction    REVIEW OF SYSTEMS:   Review of Systems  Constitutional: Negative for chills, fever and malaise/fatigue.  HENT: Negative for sore throat.   Eyes: Negative for blurred vision and double vision.  Respiratory: Positive for cough, sputum production, shortness of breath and wheezing. Negative for hemoptysis and stridor.   Cardiovascular: Negative for chest pain, palpitations, orthopnea and leg swelling.  Gastrointestinal: Negative for abdominal pain, blood in stool, diarrhea, melena, nausea and vomiting.  Genitourinary: Negative for dysuria, flank pain and hematuria.  Musculoskeletal: Negative for back pain and joint pain.  Skin: Negative for rash.  Neurological: Negative for dizziness, sensory change, focal weakness, seizures, loss of consciousness, weakness and headaches.  Endo/Heme/Allergies: Negative for polydipsia.  Psychiatric/Behavioral: Negative for depression. The patient is not nervous/anxious.     MEDICATIONS AT HOME:   Prior to Admission medications   Medication Sig Start Date End Date Taking? Authorizing Provider  ibuprofen (ADVIL,MOTRIN) 800 MG tablet Take 1 tablet (800 mg total) by mouth every 8 (eight)  hours as needed. Patient not taking: Reported on 12/27/2016 07/01/15   Menshew, Charlesetta IvoryJenise V Bacon, PA-C  oxyCODONE (ROXICODONE) 5 MG immediate release tablet Take 1-2 tablets (5-10 mg total) by mouth every 4 (four) hours as needed for severe pain. Patient not taking: Reported on 12/27/2016 11/22/15   Poggi, Excell SeltzerJohn J, MD      VITAL SIGNS:  Blood pressure (!)  140/99, pulse (!) 110, resp. rate 17, height 5\' 11"  (1.803 m), weight 230 lb (104.3 kg), SpO2 99 %.  PHYSICAL EXAMINATION:  Physical Exam  GENERAL:  56 y.o.-year-old patient lying in the bed with no acute distress. Obesity. EYES: Pupils equal, round, reactive to light and accommodation. No scleral icterus. Extraocular muscles intact.  HEENT: Head atraumatic, normocephalic. Oropharynx and nasopharynx clear.  NECK:  Supple, no jugular venous distention. No thyroid enlargement, no tenderness.  LUNGS: coarse breath sounds bilaterally, mild wheezing, bilateral crackles. No use of accessory muscles of respiration.  CARDIOVASCULAR: S1, S2 normal. No murmurs, rubs, or gallops.  ABDOMEN: Soft, nontender, nondistended. Bowel sounds present. No organomegaly or mass.  EXTREMITIES: No pedal edema, cyanosis, or clubbing.  NEUROLOGIC: Cranial nerves II through XII are intact. Muscle strength 5/5 in all extremities. Sensation intact. Gait not checked.  PSYCHIATRIC: The patient is alert and oriented x 3.  SKIN: No obvious rash, lesion, or ulcer.   LABORATORY PANEL:   CBC Recent Labs  Lab 12/27/16 0755  WBC 10.7  HGB 14.1  HCT 42.8  PLT 208   ------------------------------------------------------------------------------------------------------------------  Chemistries  Recent Labs  Lab 12/27/16 0755  NA 138  K 4.3  CL 107  CO2 24  GLUCOSE 146*  BUN 12  CREATININE 0.82  CALCIUM 8.6*   ------------------------------------------------------------------------------------------------------------------  Cardiac Enzymes Recent Labs  Lab 12/27/16 0755  TROPONINI 1.67*   ------------------------------------------------------------------------------------------------------------------  RADIOLOGY:  Dg Chest 2 View  Result Date: 12/27/2016 CLINICAL DATA:  Shortness of breath over the last 2 days. Productive cough. EXAM: CHEST  2 VIEW COMPARISON:  04/01/2006 FINDINGS: Heart size is normal.  There is widespread abnormal bilateral lung density consistent with widespread pneumonia. No dense consolidation or lobar collapse. No effusions. No significant bone finding. IMPRESSION: Widespread bilateral pneumonia without dense consolidation or collapse. Electronically Signed   By: Paulina FusiMark  Shogry M.D.   On: 12/27/2016 08:41      IMPRESSION AND PLAN:   Acute respiratory failure with hypoxia due to neumonia, CAP Admit to medical floor. Continue levaquin, f/u CBC and cultures. NEB. Continue O2 [place]. Robitussin prn.  Elevated troponin, demanding ischemia, rule out ACS. Heparin drip, F/u troponin, ASA, Lipitor, echo and cardiology consult.  Asthma, stable. NEB.  All the records are reviewed and case discussed with ED provider. Management plans discussed with the patient, daughter and they are in agreement.  CODE STATUS: DNR  TOTAL TIME TAKING CARE OF THIS PATIENT: 55 minutes.    Shaune PollackQing Hari Casaus M.D on 12/27/2016 at 10:09 AM  Between 7am to 6pm - Pager - [phone]  After 6pm go to [email or web address] - Social research officer, governmentpassword EPAS [facility]  [facility] [place] [facility]  Office  [phone]  CC: Primary care physician; Patient, No Pcp Per   Note: This dictation was prepared with Dragon dictation along with smaller phrase technology. Any transcriptional errors that result from this process are unin

## 2016-12-27 NOTE — ED Provider Notes (Signed)
Lindsey Gibson Provider Note ____________________________________________   I have reviewed the triage vital signs and the triage nursing note.  HISTORY  Chief Complaint Shortness of Breath   Historian Patient  [name] is a 56 y.o. female with a history of chronic asthmatic bronchitis, presents with cough and trouble breathing for about 3 or 4 days.  This morning she states that she walked into a warm room and her wound was cut off and she could not catch her breath.  She has had fatigue and chills without fevers.  No chest pain.  No productive cough.  No flu shot this year.  Symptoms of shortness of breath are moderate to severe earlier today, currently relatively under control at rest, but she is still coughing.   Past Medical History:  Diagnosis Date  . Asthma   . Bronchitis, chronic (HCC)    PT CURRENTLY HAVING NO ISSUES WITH BRONCHITIS (11-15-15)    There are no active problems to display for this patient.   Past Surgical History:  Procedure Laterality Date  . KNEE ARTHROSCOPY Right   . SHOULDER ARTHROSCOPY WITH SUBACROMIAL DECOMPRESSION Right 11/22/2015   Procedure: SHOULDER ARTHROSCOPY WITH DEBRIDEMENT, DECOMPRESSION  AND BICEPS TENODESIS;  Surgeon: Christena Flake, MD;  Location: [facility] ORS;  Service: Orthopedics;  Laterality: Right;  . TONSILLECTOMY    . ULNAR NERVE TRANSPOSITION Right 11/22/2015   Procedure: ULNAR NERVE DECOMPRESSION/TRANSPOSITION;  Surgeon: Christena Flake, MD;  Location: [facility] ORS;  Service: Orthopedics;  Laterality: Right;    Prior to Admission medications   Medication Sig Start Date End Date Taking? Authorizing Provider  ibuprofen (ADVIL,MOTRIN) 800 MG tablet Take 1 tablet (800 mg total) by mouth every 8 (eight) hours as needed. Patient not taking: Reported on 12/27/2016 07/01/15   Gibson, Lindsey Ivory, PA-C  oxyCODONE (ROXICODONE) 5 MG immediate release tablet Take 1-2 tablets (5-10 mg  total) by mouth every 4 (four) hours as needed for severe pain. Patient not taking: Reported on 12/27/2016 11/22/15   Gibson, Lindsey Seltzer, MD    Allergies  Allergen Reactions  . Penicillins Rash    Has patient had a PCN reaction causing immediate rash, facial/tongue/throat swelling, SOB If all of the above answers are "NO", then may proceed with Cephalosporin use.   or lightheadedness with hypotension:unsure Has patient had a PCN reaction causing severe rash involving mucus membranes or skin necrosis:unsure Has patient had a PCN reaction that required hospitalization:unsure Has patient had a PCN reaction occurring within the last 10 years:unsure  Childhood reaction    History reviewed. No pertinent family history.  Social History Social History   Tobacco Use  . Smoking status: Former Smoker    Packs/day: 1.00    Years: 12.00    Pack years: 12.00    Types: Cigarettes  . Smokeless tobacco: Never Used  Substance Use Topics  . Alcohol use: No  . Drug use: No    Review of Systems  Constitutional: Negative for fever. Eyes: Negative for visual changes. ENT: Negative for sore throat. Cardiovascular: Negative for chest pain. Respiratory: Positive for shortness of breath. Gastrointestinal: Negative for abdominal pain, vomiting and diarrhea. Genitourinary: Negative for dysuria. Musculoskeletal: Negative for back pain. Skin: Negative for rash. Neurological: Negative for headache.  ____________________________________________   PHYSICAL EXAM:  VITAL SIGNS: ED Triage Vitals [12/27/16 0756]  Enc Vitals Group     BP      Pulse      Resp  Temp      Temp src      SpO2      Weight 230 lb (104.3 kg)     Height 5\' 11"  (1.803 m)     Head Circumference      Peak Flow      Pain Score      Pain Loc      Pain Edu?      Excl. in GC?      Constitutional: Alert and oriented. Well appearing and in no distress. HEENT   Head: Normocephalic and atraumatic.      Eyes:  Conjunctivae are normal. Pupils equal and round.       Ears:         Nose: No congestion/rhinnorhea.   Mouth/Throat: Mucous membranes are mildly dry.   Neck: No stridor. Cardiovascular/Chest: Tachycardic rate, regular rhythm.  No murmurs, rubs, or gallops. Respiratory: Normal respiratory effort without tachypnea nor retractions.  Equal bilaterally, but moderate type breath sounds/wheeze with significant rhonchi throughout all fields. Gastrointestinal: Soft. No distention, no guarding, no rebound. Nontender.    Genitourinary/rectal:Deferred Musculoskeletal: Nontender with normal range of motion in all extremities. No joint effusions.  No lower extremity tenderness.  No edema. Neurologic:  Normal speech and language. No gross or focal neurologic deficits are appreciated. Skin:  Skin is warm, dry and intact. No rash noted. Psychiatric: Mood and affect are normal. Speech and behavior are normal. Patient exhibits appropriate insight and judgment.   ____________________________________________  LABS (pertinent positives/negatives) I, Lindsey Rooksebecca Indy Kuck, MD the attending physician have reviewed the labs noted below.  Labs Reviewed  BASIC METABOLIC PANEL - Abnormal; Notable for the following components:      Result Value   Glucose, Bld 146 (*)    Calcium 8.6 (*)    All other components within normal limits  CBC - Abnormal; Notable for the following components:   RBC 5.26 (*)    RDW 15.2 (*)    All other components within normal limits  TROPONIN I - Abnormal; Notable for the following components:   Troponin I 1.67 (*)    All other components within normal limits  CULTURE, BLOOD (ROUTINE X 2)  CULTURE, BLOOD (ROUTINE X 2)  LACTIC ACID, PLASMA  LACTIC ACID, PLASMA  APTT  PROTIME-INR    ____________________________________________    EKG I, Lindsey Rooksebecca Markita Stcharles, MD, the attending physician have personally viewed and interpreted all ECGs.  128 bpm.  Sinus tachycardia.  Left axis deviation.   Nonspecific ST and T wave. ____________________________________________  RADIOLOGY All Xrays were viewed by me.  Imaging interpreted by Radiologist, and I, Lindsey Rooksebecca Lindsey Treloar, MD the attending physician have reviewed the radiologist interpretation noted below.  Chest x-ray 2 view:  IMPRESSION: Widespread bilateral pneumonia without dense consolidation or collapse. __________________________________________  PROCEDURES  Procedure(s) performed: None  Critical Care performed: CRITICAL CARE Performed by: Lindsey Rooksebecca Shamar Kracke   Total critical care time: 30 minutes  Critical care time was exclusive of separately billable procedures and treating other patients.  Critical care was necessary to treat or prevent imminent or life-threatening deterioration.  Critical care was time spent personally by me on the following activities: development of treatment plan with patient and/or surrogate as well as nursing, discussions with consultants, evaluation of patient's response to treatment, examination of patient, obtaining history from patient or surrogate, ordering and performing treatments and interventions, ordering and review of laboratory studies, ordering and review of radiographic studies, pulse oximetry and re-evaluation of patient's condition.    ____________________________________________  ED  COURSE / ASSESSMENT AND PLAN  Pertinent labs & imaging results that were available during my care of the patient were reviewed by me and considered in my medical decision making (see chart for details).    Patient with a complaint of shortness of breath.  I was called by the nurse due to lab reports elevated troponin.  Repeat review of the EKG, no evidence for STEMI.  She is tachycardic with some nonspecific findings.  Patient is on 5 L nasal cannula, but able to bring down to 3 L nasal cannula satting around 95% on that.  She does not typically wear home O2.  She reports no chest pain and no ongoing  chest pain.  Her symptoms seem most consistent with the chest x-ray report of bilateral pneumonia.  She is tachycardic and so I did place her on the sepsis pathway.  Patient care discussed with hospitalist for admission.  Lactate pending upon hospitalist consultation.  Started 1 L normal saline as patient looks a little dry.  Given the elevated troponin, patient was given aspirin as well as heparin bolus and drip.   DIFFERENTIAL DIAGNOSIS: Differential includes, but is not limited to, viral syndrome, bronchitis including COPD exacerbation, pneumonia, reactive airway disease including asthma, CHF including exacerbation with or without pulmonary/interstitial edema, pneumothorax, ACS, thoracic trauma, and pulmonary embolism.  CONSULTATIONS:   Hospitalist for admission.  Patient / Family / Caregiver informed of clinical course, medical decision-making process, and agree with plan.    ___________________________________________   FINAL CLINICAL IMPRESSION(S) / ED DIAGNOSES   Final diagnoses:  Community acquired pneumonia, unspecified laterality  Sepsis, due to unspecified organism [facility])  NSTEMI (non-ST elevated myocardial infarction) (HCC)      ___________________________________________        Note: This dictation was prepared with Dragon dictation. Any transcriptional errors that result from this process are unintentional    Lindsey Rooks, MD 12/27/16 [phone]

## 2016-12-28 ENCOUNTER — Encounter: Payer: Self-pay | Admitting: Cardiovascular Disease

## 2016-12-28 ENCOUNTER — Other Ambulatory Visit: Payer: Self-pay

## 2016-12-28 DIAGNOSIS — I255 Ischemic cardiomyopathy: Secondary | ICD-10-CM

## 2016-12-28 DIAGNOSIS — J810 Acute pulmonary edema: Secondary | ICD-10-CM

## 2016-12-28 LAB — HIV ANTIBODY (ROUTINE TESTING W REFLEX): HIV SCREEN 4TH GENERATION: NONREACTIVE

## 2016-12-28 LAB — CBC
HCT: 36.9 % (ref 35.0–47.0)
Hemoglobin: 12.2 g/dL (ref 12.0–16.0)
MCH: 26.5 pg (ref 26.0–34.0)
MCHC: 33.1 g/dL (ref 32.0–36.0)
MCV: 80.2 fL (ref 80.0–100.0)
PLATELETS: 202 10*3/uL (ref 150–440)
RBC: 4.6 MIL/uL (ref 3.80–5.20)
RDW: 15.2 % — ABNORMAL HIGH (ref 11.5–14.5)
WBC: 6.1 10*3/uL (ref 3.6–11.0)

## 2016-12-28 LAB — LIPID PANEL
CHOL/HDL RATIO: 4.7 ratio
Cholesterol: 177 mg/dL (ref 0–200)
HDL: 38 mg/dL — ABNORMAL LOW (ref >40)
LDL Cholesterol: 115 mg/dL — ABNORMAL HIGH (ref 0–99)
Triglycerides: 120 mg/dL (ref <150)
VLDL: 24 mg/dL (ref 0–40)

## 2016-12-28 LAB — BASIC METABOLIC PANEL
Anion gap: 7 (ref 5–15)
BUN: 12 mg/dL (ref 6–20)
CHLORIDE: 104 mmol/L (ref 101–111)
CO2: 26 mmol/L (ref 22–32)
CREATININE: 0.8 mg/dL (ref 0.44–1.00)
Calcium: 8.5 mg/dL — ABNORMAL LOW (ref 8.9–10.3)
GFR calc non Af Amer: >60 mL/min (ref >60)
GLUCOSE: 112 mg/dL — AB (ref 65–99)
Potassium: 3.5 mmol/L (ref 3.5–5.1)
Sodium: 137 mmol/L (ref 135–145)

## 2016-12-28 MED ORDER — POTASSIUM CHLORIDE CRYS ER 20 MEQ PO TBCR
40.0000 meq | EXTENDED_RELEASE_TABLET | Freq: Once | ORAL | Status: AC
  Administered 2016-12-28: 40 meq via ORAL
  Filled 2016-12-28: qty 2

## 2016-12-28 MED ORDER — FUROSEMIDE 10 MG/ML IJ SOLN
40.0000 mg | Freq: Once | INTRAMUSCULAR | Status: AC
  Administered 2016-12-28: 40 mg via INTRAVENOUS
  Filled 2016-12-28: qty 4

## 2016-12-28 NOTE — Progress Notes (Addendum)
[facility] - [place] at [facility]   PATIENT NAME: Lindsey PippinsDeborah Gibson    MR#:  [id number]  DATE OF BIRTH:  05/09/1960  SUBJECTIVE:  CHIEF COMPLAINT:   Chief Complaint  Patient presents with  . Shortness of Breath    REVIEW OF SYSTEMS:  Review of Systems  Constitutional: Negative for chills, fever and malaise/fatigue.  HENT: Negative for sore throat.   Eyes: Negative for blurred vision and double vision.  Respiratory: Negative for cough, hemoptysis, shortness of breath, wheezing and stridor.   Cardiovascular: Negative for chest pain, palpitations, orthopnea and leg swelling.  Gastrointestinal: Negative for abdominal pain, blood in stool, diarrhea, melena, nausea and vomiting.  Genitourinary: Negative for dysuria, flank pain and hematuria.  Musculoskeletal: Negative for back pain and joint pain.  Neurological: Negative for dizziness, sensory change, focal weakness, seizures, loss of consciousness, weakness and headaches.  Endo/Heme/Allergies: Negative for polydipsia.  Psychiatric/Behavioral: Negative for depression. The patient is not nervous/anxious.     DRUG ALLERGIES:   Allergies  Allergen Reactions  . Penicillins Rash    Has patient had a PCN reaction causing immediate rash, facial/tongue/throat swelling, SOB If all of the above answers are "NO", then may proceed with Cephalosporin use.   or lightheadedness with hypotension:unsure Has patient had a PCN reaction causing severe rash involving mucus membranes or skin necrosis:unsure Has patient had a PCN reaction that required hospitalization:unsure Has patient had a PCN reaction occurring within the last 10 years:unsure  Childhood reaction   VITALS:  Blood pressure (!) 115/58, pulse 99, temperature (!) 97.5 F (36.4 C), temperature source Oral, resp. rate 20, height 5\' 11"  (1.803 m), weight 252 lb 3.3 oz (114.4 kg), SpO2 97 %. PHYSICAL EXAMINATION:  Physical Exam  Constitutional: She is oriented to  person, place, and time and well-developed, well-nourished, and in no distress.  Obesity.  HENT:  Head: Normocephalic.  Mouth/Throat: Oropharynx is clear and moist.  Eyes: Conjunctivae and EOM are normal. Pupils are equal, round, and reactive to light. No scleral icterus.  Neck: Normal range of motion. Neck supple. No JVD present. No tracheal deviation present.  Cardiovascular: Normal rate, regular rhythm and normal heart sounds. Exam reveals no gallop.  No murmur heard. Pulmonary/Chest: Effort normal. No respiratory distress. She has no wheezes. She has rales.  Abdominal: Soft. Bowel sounds are normal. She exhibits no distension. There is no tenderness. There is no rebound.  Musculoskeletal: Normal range of motion. She exhibits no edema or tenderness.  Neurological: She is alert and oriented to person, place, and time. No cranial nerve deficit.  Skin: No rash noted. No erythema.  Psychiatric: Affect normal.   LABORATORY PANEL:  Female CBC Recent Labs  Lab 12/28/16 0642  WBC 6.1  HGB 12.2  HCT 36.9  PLT 202   ------------------------------------------------------------------------------------------------------------------ Chemistries  Recent Labs  Lab 12/27/16 1146 12/28/16 0642  NA  --  137  K  --  3.5  CL  --  104  CO2  --  26  GLUCOSE  --  112*  BUN  --  12  CREATININE  --  0.80  CALCIUM  --  8.5*  MG 2.1  --    RADIOLOGY:  No results found. ASSESSMENT AND PLAN:   Acute respiratory failure with hypoxia due to acute systolic CHF with pulmonary edema. Weaned off oxygen.  NEB PRN. Robitussin prn. No PNA, discontinued levaquin.  Acute systolic CHF with pulmonary edema IV Lasix with potassium supplement. Started Coreg and losartan, spironolactone if blood  pressure allows, Lasix 20 mg p.o. daily with potassium and Consider repeat echo in ~ 3 months to assess for improvement in EF per Dr. Mariah Milling.  NSTEMI. Off heparin drip, s/p PCI, continue ASA and added plavix,  Lipitor.  Per lipidemia.  Lipitor.  Asthma, stable. NEB.  Discussed with cardiologist.  All the records are reviewed and case discussed with Care Management/Social Worker. Management plans discussed with the patient, her daughter and they are in agreement.  CODE STATUS: DNR  TOTAL TIME TAKING CARE OF THIS PATIENT: 33 minutes.   More than 50% of the time was spent in counseling/coordination of care: YES  POSSIBLE D/C IN 1-2 DAYS, DEPENDING ON CLINICAL CONDITION.   Shaune Pollack M.D on 12/28/2016 at 1:00 PM  Between 7am to 6pm - Pager - [phone]  After 6pm go to [email or web address] - Therapist, nutritional Hospitalists

## 2016-12-28 NOTE — Care Management (Signed)
RNCM met with patient and her daughter to discuss transition of care. Patient does not have health insurance. She states she does not have PCP and cannot afford any medications. She states she has not been on any medications at home.  She agrees to referral to [facility] and [facility]ication [facility]. Bril[employer] Voucher provided to her to take to her pharmacy. She lives with her husband. She is no longer requiring supplemental O2. She is independent from home.

## 2016-12-28 NOTE — Progress Notes (Signed)
Pt ambulated around nurses station X3 laps. Tolerated well. Required no assistive device.

## 2016-12-29 MED ORDER — ALBUTEROL SULFATE (2.5 MG/3ML) 0.083% IN NEBU: 2.5000 mg | INHALATION_SOLUTION | Freq: Four times a day (QID) | RESPIRATORY_TRACT | Status: DC | PRN

## 2016-12-29 MED ORDER — ALBUTEROL SULFATE HFA 108 (90 BASE) MCG/ACT IN AERS
1.0000 | INHALATION_SPRAY | Freq: Four times a day (QID) | RESPIRATORY_TRACT | 1 refills | Status: DC | PRN
Start: 2016-12-29 — End: 2018-06-04

## 2016-12-29 MED ORDER — ALBUTEROL SULFATE HFA 108 (90 BASE) MCG/ACT IN AERS: 1.0000 | INHALATION_SPRAY | Freq: Four times a day (QID) | RESPIRATORY_TRACT | Status: DC | PRN

## 2016-12-29 MED ORDER — ASPIRIN 81 MG PO CHEW: 81.0000 mg | CHEWABLE_TABLET | Freq: Every day | ORAL | 3 refills | Status: DC

## 2016-12-29 MED ORDER — ATORVASTATIN CALCIUM 40 MG PO TABS: 40.0000 mg | ORAL_TABLET | Freq: Every day | ORAL | 3 refills | Status: DC

## 2016-12-29 MED ORDER — CARVEDILOL 3.125 MG PO TABS: 3.1250 mg | ORAL_TABLET | Freq: Two times a day (BID) | ORAL | 3 refills | Status: DC

## 2016-12-29 MED ORDER — TICAGRELOR 90 MG PO TABS: 90.0000 mg | ORAL_TABLET | Freq: Two times a day (BID) | ORAL | 2 refills | Status: DC

## 2016-12-29 MED ORDER — LOSARTAN POTASSIUM 25 MG PO TABS: 25.0000 mg | ORAL_TABLET | Freq: Every day | ORAL | 3 refills | Status: DC

## 2016-12-29 NOTE — Care Management Note (Signed)
Case Management Note  Patient Details  Name: Lindsey Gibson MRN: [id number] Date of Birth: 10/11/1960  Subjective/Objective:     Lindsey Gibson already has [place] Bril[employer] coupon and applications to [facility] and [facility], and Gibson list of local clinics which assist the uninsured. She is being discharged on Gibson weekend and was provided with [place] Match coupon per large number of new medications she is being discharged with.                Action/Plan:   Expected Discharge Date:  12/29/16               Expected Discharge Plan:  Home/Self Care  In-House Referral:     Discharge planning Services  Indigent Health Clinic, Medication Assistance, CM [facility])  Post Acute Care Choice:  NA Choice offered to:  Patient, Adult Children  DME Arranged:  N/Gibson DME Agency:  NA  HH Arranged:  NA HH Agency:  NA  Status of Service:  Completed, signed off  If discussed at Long Length of Stay Meetings, dates discussed:    Additional Comments:  Lindsey Spies A, RN 12/29/2016, 8:49 AM

## 2016-12-29 NOTE — Discharge Summary (Signed)
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 Asthma   . Bronchitis, chronic (HCC)    PT CURRENTLY HAVING NO ISSUES WITH BRONCHITIS (11-15-15)    HOSPITAL COURSE:  ADRIEL DESROSIER is a 56 y.o. female with a hx of COPD 2/2 prior tobacco abuse, asthma, osteoarthritis.She presented to [facility] on 12/27 with increased nasal congestion for 3-4 days that was improving. Never with cough, sore throat, sinus pressure, rhinorrhea, fever, chills, or myalgias. She woke up this morning and felt, "fine." Upon walking into the kitchen she developed sudden onset of SOB. Never with chest pain  1. ACUTE NSTEMI: -Never with chest pain -Presenting symptom of respiratory distress with pulmonary edema -Status post cardiac catheterization 12/27/2016 that showed plaque rupture in the mid LAD, confirmed with IVUS-Successful PCI/DES to the mid LAD -DAPT with ASA and Brilinta for the first 30 days, thereafter, she will need to transition to Plavix 75 mg daily with ASA. Pt will f/u with Dr Mariah Milling as out pt -Case management has been consulted to assist with medications and Brilinta card -Cardiac rehab ordered by cardiology  2. Pulmonary edema/acute systolic CHF/ICM: -IV Lasix x1 with KCl repletion--diuresed well. sats 97% on RA -Continue Coreg and  losartan, BP precludes further titration at this time -Add spironolactone if BP allows prior to discharge -Will likely need PO Lasix at discharge -CHF education -Daily weights, strict Is and Os -Consider repeat echo in ~ 3 months to assess for improvement in EF s/p PCI and on optimal medications -Unlikely to require LifeVest at discharge  3. HLD: -Lipitor -Goal LDL < 70 -Recheck lipids in ~ 6-8 weeks, if she remains above goal at that time consider addition of Zetia vs PCSK9 inhibitor   4. Acute respiratory distress with hypoxia: -Less likely PNA -More likely pulmonary edema in the setting of her NSTEMI -Diuresis as above -Now on room air -Defer management of ABX to IM  5. Asthma/COPD/prior tobacco abuse: -She requests a refill of her albuterol at discharge, defer to IM for this -Stopped smoking ~ 25 years prior    CONSULTS OBTAINED:  Treatment Team:  Iran Ouch, MD  DRUG ALLERGIES:   Allergies  Allergen Reactions  . Penicillins Rash    Has patient had a PCN reaction causing immediate rash, facial/tongue/throat swelling, SOB If all of the above answers are "NO", then may proceed with Cephalosporin use.   or lightheadedness with hypotension:unsure Has patient had a PCN reaction causing severe rash involving mucus membranes or skin necrosis:unsure Has patient had a PCN reaction that required hospitalization:unsure Has patient had a PCN reaction occurring within the last 10 years:unsure  Childhood reaction    DISCHARGE MEDICATIONS:   Allergies as of 12/29/2016      Reactions   Penicillins Rash   Has patient had a PCN reaction causing immediate rash, facial/tongue/throat swelling, SOB If all of the above  answers are "NO", then may proceed with Cephalosporin use.  or lightheadedness with hypotension:unsure Has patient had a PCN reaction causing severe rash involving mucus membranes or skin necrosis:unsure Has patient had a PCN reaction that required  hospitalization:unsure Has patient had a PCN reaction occurring within the last 10 years:unsure Childhood reaction      Medication List    STOP taking these medications   ibuprofen 800 MG tablet Commonly known as:  ADVIL,MOTRIN   oxyCODONE 5 MG immediate release tablet Commonly known as:  ROXICODONE     TAKE these medications   albuterol 108 (90 Base) MCG/ACT inhaler Commonly known as:  PROVENTIL HFA;VENTOLIN HFA Inhale 1 puff into the lungs every 6 (six) hours as needed for wheezing or shortness of breath.   aspirin 81 MG chewable tablet Chew 1 tablet (81 mg total) by mouth daily.   atorvastatin 40 MG tablet Commonly known as:  LIPITOR Take 1 tablet (40 mg total) by mouth daily at 6 PM.   carvedilol 3.125 MG tablet Commonly known as:  COREG Take 1 tablet (3.125 mg total) by mouth 2 (two) times daily with a meal.   losartan 25 MG tablet Commonly known as:  COZAAR Take 1 tablet (25 mg total) by mouth daily.   ticagrelor 90 MG Tabs tablet Commonly known as:  BRILINTA Take 1 tablet (90 mg total) by mouth 2 (two) times daily.       If you experience worsening of your admission symptoms, develop shortness of breath, life threatening emergency, suicidal or homicidal thoughts you must seek medical attention immediately by calling 911 or calling your MD immediately  if symptoms less severe.  You Must read complete instructions/literature along with all the possible adverse reactions/side effects for all the Medicines you take and that have been prescribed to you. Take any new Medicines after you have completely understood and accept all the possible adverse reactions/side effects.   Please note  You were cared for by a hospitalist during your hospital stay. If you have any questions about your discharge medications or the care you received while you were in the hospital after you are discharged, you can call the unit and asked to speak with the hospitalist on call if the  hospitalist that took care of you is not available. Once you are discharged, your primary care physician will handle any further medical issues. Please note that NO REFILLS for any discharge medications will be authorized once you are discharged, as it is imperative that you return to your primary care physician (or establish a relationship with a primary care physician if you do not have one) for your aftercare needs so that they can reassess your need for medications and monitor your lab values. Today   SUBJECTIVE   Doing well. Husband in the room  VITAL SIGNS:  Blood pressure (!) 104/47, pulse 85, temperature 97.8 F (36.6 C), temperature source Oral, resp. rate 17, height 5\' 11"  (1.803 m), weight 112.9 kg (248 lb 14.4 oz), SpO2 97 %.  I/O:    Intake/Output Summary (Last 24 hours) at 12/29/2016 0755 Last data filed at 12/29/2016 0540 Gross per 24 hour  Intake 243 ml  Output 750 ml  Net -507 ml    PHYSICAL EXAMINATION:  GENERAL:  56 y.o.-year-old patient lying in the bed with no acute distress.  EYES: Pupils equal, round, reactive to light and accommodation. No scleral icterus. Extraocular muscles intact.  HEENT: Head atraumatic, normocephalic. Oropharynx and nasopharynx clear.  NECK:  Supple,  no jugular venous distention. No thyroid enlargement, no tenderness.  LUNGS: Normal breath sounds bilaterally, no wheezing, rales,rhonchi or crepitation. No use of accessory muscles of respiration.  CARDIOVASCULAR: S1, S2 normal. No murmurs, rubs, or gallops.  ABDOMEN: Soft, non-tender, non-distended. Bowel sounds present. No organomegaly or mass.  EXTREMITIES: No pedal edema, cyanosis, or clubbing.  NEUROLOGIC: Cranial nerves II through XII are intact. Muscle strength 5/5 in all extremities. Sensation intact. Gait not checked.  PSYCHIATRIC: The patient is alert and oriented x 3.  SKIN: No obvious rash, lesion, or ulcer.   DATA REVIEW:   CBC  Recent Labs  Lab 12/28/16 0642  WBC 6.1   HGB 12.2  HCT 36.9  PLT 202    Chemistries  Recent Labs  Lab 12/27/16 1146 12/28/16 0642  NA  --  137  K  --  3.5  CL  --  104  CO2  --  26  GLUCOSE  --  112*  BUN  --  12  CREATININE  --  0.80  CALCIUM  --  8.5*  MG 2.1  --     Microbiology Results   Recent Results (from the past 240 hour(s))  Culture, blood (routine x 2)     Status: None (Preliminary result)   Collection Time: 12/27/16  9:29 AM  Result Value Ref Range Status   Specimen Description BLOOD LEFT ANTECUBITAL  Final   Special Requests BOTTLES DRAWN AEROBIC AND ANAEROBIC BCAV  Final   Culture   Final    NO GROWTH < 24 HOURS Performed at [facility], [address], [place], [place] [address]    Report Status PENDING  Incomplete  Culture, blood (routine x 2)     Status: None (Preliminary result)   Collection Time: 12/27/16  9:30 AM  Result Value Ref Range Status   Specimen Description BLOOD RIGHT ANTECUBITAL  Final   Special Requests   Final    BOTTLES DRAWN AEROBIC AND ANAEROBIC Blood Culture adequate volume   Culture   Final    NO GROWTH < 24 HOURS Performed at [facility], [address] [place], [place] [address]    Report Status PENDING  Incomplete  MRSA PCR Screening     Status: Abnormal   Collection Time: 12/27/16  5:44 PM  Result Value Ref Range Status   MRSA by PCR POSITIVE (A) NEGATIVE Final    Comment:        The GeneXpert MRSA Assay (FDA approved for NASAL specimens only), is one component of a comprehensive MRSA colonization surveillance program. It is not intended to diagnose MRSA infection nor to guide or monitor treatment for MRSA infections. RESULT CALLED TO, READ BACK BY AND VERIFIED WITH: AMELIA BERRY 12/27/16 1906 KLW Performed at [facility], [address] [place], [place] [address]     RADIOLOGY:  Dg Chest 2 View  Result Date: 12/27/2016 CLINICAL DATA:  Shortness of breath over the last 2 days. Productive cough. EXAM: CHEST  2  VIEW COMPARISON:  04/01/2006 FINDINGS: Heart size is normal. There is widespread abnormal bilateral lung density consistent with widespread pneumonia. No dense consolidation or lobar collapse. No effusions. No significant bone finding. IMPRESSION: Widespread bilateral pneumonia without dense consolidation or collapse. Electronically Signed   By: Paulina Fusi M.D.   On: 12/27/2016 08:41     Management plans discussed with the patient, family and they are in agreement.  CODE STATUS:     Code Status Orders  (From admission, onward)  Start     Ordered   12/27/16 1130  Do not attempt resuscitation (DNR)  Continuous    Question Answer Comment  In the event of cardiac or respiratory ARREST Do not call a "code blue"   In the event of cardiac or respiratory ARREST Do not perform Intubation, CPR, defibrillation or ACLS   In the event of cardiac or respiratory ARREST Use medication by any route, position, wound care, and other measures to relive pain and suffering. May use oxygen, suction and manual treatment of airway obstruction as needed for comfort.      12/27/16 1129    Code Status History    Date Active Date Inactive Code Status Order ID Comments User Context   11/22/2015 14:54 11/22/2015 18:50 Full Code [id number]  Poggi, Excell Seltzer, MD Inpatient      TOTAL TIME TAKING CARE OF THIS PATIENT: 40 minutes.    Enedina Finner M.D on 12/29/2016 at 7:55 AM  Between 7am to 6pm - Pager - [phone] After 6pm go to [email or web address] - password [email or web address]  [facility]  Office  [phone]  CC: Primary care physician; Patient, No Pcp Per

## 2016-12-29 NOTE — Progress Notes (Signed)
Pt to be discharged to home today. Iv and tele removed. Pt up and about in room. disch instructions and prescrips given to pt  And husband. disch via. W.c.

## 2017-01-01 LAB — CULTURE, BLOOD (ROUTINE X 2)
Culture: NO GROWTH
Culture: NO GROWTH
SPECIAL REQUESTS: ADEQUATE

## 2017-01-03 ENCOUNTER — Telehealth: Payer: Self-pay | Admitting: Cardiovascular Disease

## 2017-01-03 NOTE — Telephone Encounter (Signed)
TCM....     They saw Kirke Corin   They are scheduled to see Eula Listen 01/24/17 at 9 am   They were seen for  PCI - Dr. Kirke Corin   They need to be seen within 1 wk   Pt added  wait list

## 2017-01-03 NOTE — Telephone Encounter (Signed)
Patient contacted regarding discharge from [facility] on 12/29/16.  Patient understands to follow up with provider Ward Givens NP on 01/16/16 at 08:30AM at [facility]. Patient understands discharge instructions? Yes Patient understands medications and regiment? Yes Patient understands to bring all medications to this visit? Yes  Patient states that she was on fluid medication in the hospital but they did not send her home on anything. Reviewed discharge medications with her and they did not prescribe anything but I do see cardiology note with recommendations. Will route to Dr. Mariah Milling for review.

## 2017-01-05 NOTE — Telephone Encounter (Signed)
Yes, need lasix 20 daily with Kdur 10 daily  Also needs spironolactone 25 daily

## 2017-01-07 MED ORDER — FUROSEMIDE 20 MG PO TABS: 20.0000 mg | ORAL_TABLET | Freq: Every day | ORAL | 6 refills | Status: DC

## 2017-01-07 MED ORDER — SPIRONOLACTONE 25 MG PO TABS: 25.0000 mg | ORAL_TABLET | Freq: Every day | ORAL | 6 refills | Status: DC

## 2017-01-07 MED ORDER — POTASSIUM CHLORIDE ER 10 MEQ PO TBCR: 10.0000 meq | EXTENDED_RELEASE_TABLET | Freq: Every day | ORAL | 6 refills | Status: DC

## 2017-01-07 NOTE — Telephone Encounter (Signed)
Spoke with patient and reviewed additional medications that she should be taking. She verbalized understanding with read back of medications, dosages, and frequency. She had no further questions or concerns at this time and confirmed appointment information. Medications sent to her pharmacy of choice.

## 2017-01-08 ENCOUNTER — Telehealth: Payer: Self-pay | Admitting: Cardiovascular Disease

## 2017-01-08 NOTE — Telephone Encounter (Signed)
S/w Dylan at [facility] pharmacy. I confirmed that patient is supposed to be taking spironolactone, potassium and furosemide. He confirmed this.  Notified patient that I s/w pharmacy and she should be able to get her medications now. She stated she is taking furosemide as well. Patient is at pharmacy now to pick up medications.

## 2017-01-08 NOTE — Telephone Encounter (Signed)
Please call to discuss drug interaction. She is not sure which medications, they were called in yesterday.

## 2017-01-08 NOTE — Telephone Encounter (Signed)
Spoke with Brandi at the pharmacy and confirmed that patient is on spironolactone, potassium, as well as furosemide and losartan. She reports patient confirmed the ordered medications so she has picked them up. She was appreciative for the call back with no further questions.

## 2017-01-08 NOTE — Telephone Encounter (Addendum)
S/w patient. She stated the pharmacy said "they were kicking back one of her medications to [place] because of possible interaction."  I stated we had not received any notification at this time.  I attempted to reach [facility] pharmacy 3 times and it never did ring to a person to pick up the phone or to be able to leave a message.  Called patient back and let her know I was unable to contact them at this time. She is on the way there now and will discuss with them and have them call us back.

## 2017-01-09 ENCOUNTER — Ambulatory Visit: Payer: Self-pay | Admitting: Physician Assistant

## 2017-01-10 ENCOUNTER — Ambulatory Visit: Payer: Self-pay | Admitting: Family

## 2017-01-15 ENCOUNTER — Ambulatory Visit (INDEPENDENT_AMBULATORY_CARE_PROVIDER_SITE_OTHER): Payer: Self-pay | Admitting: Nurse Practitioner

## 2017-01-15 ENCOUNTER — Encounter: Payer: Self-pay | Admitting: Nurse Practitioner

## 2017-01-15 VITALS — BP 124/76 | HR 69 | Ht 71.0 in | Wt 246.8 lb

## 2017-01-15 DIAGNOSIS — I5042 Chronic combined systolic (congestive) and diastolic (congestive) heart failure: Secondary | ICD-10-CM

## 2017-01-15 DIAGNOSIS — I214 Non-ST elevation (NSTEMI) myocardial infarction: Secondary | ICD-10-CM

## 2017-01-15 DIAGNOSIS — E785 Hyperlipidemia, unspecified: Secondary | ICD-10-CM

## 2017-01-15 DIAGNOSIS — I255 Ischemic cardiomyopathy: Secondary | ICD-10-CM

## 2017-01-15 DIAGNOSIS — I2510 Atherosclerotic heart disease of native coronary artery without angina pectoris: Secondary | ICD-10-CM

## 2017-01-15 MED ORDER — NITROGLYCERIN 0.4 MG SL SUBL: 0.4000 mg | SUBLINGUAL_TABLET | SUBLINGUAL | 3 refills | Status: DC | PRN

## 2017-01-15 NOTE — Patient Instructions (Signed)
Medication Instructions: - Your physician has recommended you make the following change in your medication:  1) a prescription has been sent in for Nitroglycerin 0.4 mg- take 1 tablet by mouth every 3 minutes up to 3 doses as needed for chest pain.   - if you are taking a 3rd nitroglycerin and symptoms persist, then report to the ER for further evaluation   Labwork: - Your physician recommends that you have lab work today: BMP  Procedures/Testing: - none ordered  Follow-Up: - Your physician recommends that you schedule a follow-up appointment in: 1 month with Dr. Roney Marion, NP   Any Additional Special Instructions Will Be Listed Below (If Applicable).     If you need a refill on your cardiac medications before your next appointment, please call your pharmacy.

## 2017-01-15 NOTE — Progress Notes (Signed)
Office Visit    Patient Name: Lindsey Gibson Date of Encounter: 01/15/2017  Primary Care Provider:  Patient, No Pcp Per Primary Cardiologist:  Lorine Bears, MD  Chief Complaint    57 year old female with a history of COPD and recent non-STEMI and LAD stenting, complicated by pulmonary edema/ischemic cardiomyopathy with an EF of 20-25%, who presents for hospital follow-up.  Past Medical History    Past Medical History:  Diagnosis Date  . Asthma   . CAD (coronary artery disease)    a. 12/2016 NSTEMI/PCI: LM nl, LAD 30ost, 35m (IVUS->severe plaque burden->3.0 x 18 Sierra DES), D1/2/3 nl, LCX 30p, OM1/2/3 nl, RCA nl, RPDA/RPAV/RPL1/2 nl.  . COPD (chronic obstructive pulmonary disease) (HCC)   . Ischemic cardiomyopathy    a. 12/2016 Echo: EF 20-25%, mid-apicalanteroseptal, ant, and apical AK, GR1 DD, triv AI, mildly dil LA.  . Morbid obesity (HCC)    Past Surgical History:  Procedure Laterality Date  . CORONARY STENT INTERVENTION N/A 12/27/2016   Procedure: CORONARY STENT INTERVENTION;  Surgeon: Iran Ouch, MD;  Location: [facility];  Service: Cardiovascular;  Laterality: N/A;  . INTRAVASCULAR ULTRASOUND/IVUS N/A 12/27/2016   Procedure: Intravascular Ultrasound/IVUS;  Surgeon: Iran Ouch, MD;  Location: [facility];  Service: Cardiovascular;  Laterality: N/A;  . KNEE ARTHROSCOPY Right   . LEFT HEART CATH AND CORONARY ANGIOGRAPHY N/A 12/27/2016   Procedure: LEFT HEART CATH AND CORONARY ANGIOGRAPHY;  Surgeon: Iran Ouch, MD;  Location: [facility];  Service: Cardiovascular;  Laterality: N/A;  . SHOULDER ARTHROSCOPY WITH SUBACROMIAL DECOMPRESSION Right 11/22/2015   Procedure: SHOULDER ARTHROSCOPY WITH DEBRIDEMENT, DECOMPRESSION  AND BICEPS TENODESIS;  Surgeon: Christena Flake, MD;  Location: [facility] OR[facility];  Service: Orthopedics;  Laterality: Right;  . TONSILLECTOMY    . ULNAR NERVE TRANSPOSITION Right 11/22/2015   Procedure: ULNAR NERVE  DECOMPRESSION/TRANSPOSITION;  Surgeon: Christena Flake, MD;  Location: [facility];  Service: Orthopedics;  Laterality: Right;    Allergies  Allergies  Allergen Reactions  . Penicillins Rash    Has patient had a PCN reaction causing immediate rash, facial/tongue/throat swelling, SOB If all of the above answers are "NO", then may proceed with Cephalosporin use.   or lightheadedness with hypotension:unsure Has patient had a PCN reaction causing severe rash involving mucus membranes or skin necrosis:unsure Has patient had a PCN reaction that required hospitalization:unsure Has patient had a PCN reaction occurring within the last 10 years:unsure  Childhood reaction    History of Present Illness    57 year old female with the above past medical history including remote tobacco abuse, asthma, and COPD/chronic bronchitis.  She was hospitalized in late December with acute pulmonary edema and respiratory failure.  Troponin rose to 15.33.  Echo showed LV dysfunction with an EF of 20-25%.  Diagnostic catheterization was performed and showed severe mid LAD disease which required drug-eluting stent placement.  She was subsequently discharged on aspirin, Brilinta, beta-blocker, ARB, spironolactone, Lasix, and statin therapy.  Since discharge, she has done well.  She says she has been walking fairly regularly, often at [facility], without experiencing chest pain or dyspnea.  She has noted that immediately following meals, sometimes she will have a burning sensation in her chest that goes through to her back, last about 5 minutes, and resolve spontaneously.  She has never had these symptoms with exertion or outside of periods surrounding meals.  She is not aware of any prior history of GERD/peptic ulcer disease.  She has been  weighing herself daily and notes that her weight is down 2 pounds.  She has had some cramping in her hands and forearms.  I note that her discharge potassium was 3.5.  She is doing her best to  avoid high sodium foods.  She denies PND, orthopnea, dizziness, syncope, edema, early satiety, palpitations, nausea, vomiting.  She reports compliance with all medications.  Home Medications    Prior to Admission medications   Medication Sig Start Date End Date Taking? Authorizing Provider  albuterol (PROVENTIL HFA;VENTOLIN HFA) 108 (90 Base) MCG/ACT inhaler Inhale 1 puff into the lungs every 6 (six) hours as needed for wheezing or shortness of breath. 12/29/16  Yes Enedina Finner, MD  aspirin 81 MG chewable tablet Chew 1 tablet (81 mg total) by mouth daily. 12/29/16  Yes Enedina Finner, MD  atorvastatin (LIPITOR) 40 MG tablet Take 1 tablet (40 mg total) by mouth daily at 6 PM. 12/29/16  Yes Enedina Finner, MD  carvedilol (COREG) 3.125 MG tablet Take 1 tablet (3.125 mg total) by mouth 2 (two) times daily with a meal. 12/29/16  Yes Enedina Finner, MD  furosemide (LASIX) 20 MG tablet Take 1 tablet (20 mg total) by mouth daily. 01/07/17 04/07/17 Yes Gollan, Tollie Pizza, MD  losartan (COZAAR) 25 MG tablet Take 1 tablet (25 mg total) by mouth daily. 12/29/16  Yes Enedina Finner, MD  potassium chloride (K-DUR) 10 MEQ tablet Take 1 tablet (10 mEq total) by mouth daily. 01/07/17 04/07/17 Yes Gollan, Tollie Pizza, MD  spironolactone (ALDACTONE) 25 MG tablet Take 1 tablet (25 mg total) by mouth daily. 01/07/17 04/07/17 Yes Gollan, Tollie Pizza, MD  ticagrelor (BRILINTA) 90 MG TABS tablet Take 1 tablet (90 mg total) by mouth 2 (two) times daily. 12/29/16  Yes Enedina Finner, MD  nitroGLYCERIN (NITROSTAT) 0.4 MG SL tablet Place 1 tablet (0.4 mg total) under the tongue every 5 (five) minutes as needed for chest pain. 01/15/17 04/15/17  Creig Hines, NP    Review of Systems    As above overall doing well.  She has had occasional chest burning immediately following meals which lasts 5 minutes and resolve spontaneously.  She otherwise denies angina, dyspnea, palpitations, PND, orthopnea, dizziness, syncope, edema, or early satiety.  All  other systems reviewed and are otherwise negative except as noted above.  Physical Exam    VS:  BP 124/76 (BP Location: Left Arm, Patient Position: Sitting, Cuff Size: Normal)   Pulse 69   Ht 5\' 11"  (1.803 m)   Wt 246 lb 12 oz (111.9 kg)   BMI 34.41 kg/m  , BMI Body mass index is 34.41 kg/m. GEN: Obese, in no acute distress.  HEENT: normal.  Neck: Supple, no JVD, carotid bruits, or masses. Cardiac: RRR, no murmurs, rubs, or gallops. No clubbing, cyanosis, edema.  Radials/DP/PT 2+ and equal bilaterally.  Respiratory:  Respirations regular and unlabored, clear to auscultation bilaterally. GI: Soft, nontender, nondistended, BS + x 4. MS: no deformity or atrophy. Skin: warm and dry, no rash. Neuro:  Strength and sensation are intact. Psych: Normal affect.  Accessory Clinical Findings    ECG -regular sinus rhythm, 69, leftward axis, diffuse T wave inversion.    Assessment & Plan    1.  Non-STEMI, subsequent episode of care/coronary artery disease: Status post admission in late December with acute pulmonary edema and respiratory failure.  She did not have any chest pain at that time.  Echo showed EF of 20-25% while catheterization subsequently revealed ulcerated plaque in the LAD  which was successfully treated with drug-eluting stent.  She has been doing well since discharge and compliant with medications.  She has been walking quite a bit without symptoms or limitations, though she sometimes notes a burning sensation after meals.  I did recommend that she try over-the-counter Pepcid for that.  She otherwise remains on aspirin, Brilinta, statin, beta-blocker, and ARB therapy.  I filled out [place] for her today for the [place] for her Brilinta.  If for some reason, she is not able to obtain this, we would plan to switch her over to Plavix next month prior to running out of Brilinta, as she has no insurance.  2.  Ischemic cardiomyopathy/chronic combined systolic  and diastolic congestive heart failure: Patient's presenting symptoms were that of dyspnea/respiratory failure and acute pulmonary edema.  Echo showed EF 20-25%.  She is now status post LAD stenting.  She is euvolemic on exam and weighing herself daily.  Weight is down 2 pounds since discharge.  We discussed the importance of daily weights, sodium restriction, medication compliance, and symptom reporting and she verbalizes understanding.  She has had some cramping in her hands and forearms and was hypokalemic at discharge.  I will follow-up a basic metabolic panel today.  She remains on beta-blocker, ARB, Lasix, and spironolactone.  Plan to follow-up echocardiogram in about 10 more weeks to reevaluate LV function and determine candidacy for ICD.  3.  Hyperlipidemia: LDL was 115 during admission.  She remains on high potency statin therapy.  Plan to follow-up lipids and LFTs when I see her back next month.  4.  GERD: Patient has been having GERD-like symptoms since discharge, stating that she is having a burning sensation with meals that lasts about 5 minutes and resolve spontaneously.  She has no prior history of GERD.  Question of aspirin is upsetting her stomach.  I did recommend that she can take over-the-counter Pepcid as she is trying to avoid high cost medication.  Otherwise, I would have a low threshold to add PPI therapy.  5.  Disposition: Follow-up basic metabolic panel today.  I will see her back in 1 month to reassess at which time we will check lipids and LFTs and likely schedule her for echo in late March/early April.     Nicolasa Ducking, NP 01/15/2017, 9:53 AM

## 2017-01-16 LAB — BASIC METABOLIC PANEL
BUN/Creatinine Ratio: 18 (ref 9–23)
BUN: 16 mg/dL (ref 6–24)
CALCIUM: 9.5 mg/dL (ref 8.7–10.2)
CHLORIDE: 102 mmol/L (ref 96–106)
CO2: 23 mmol/L (ref 20–29)
Creatinine, Ser: 0.89 mg/dL (ref 0.57–1.00)
GFR calc non Af Amer: 72 mL/min/{1.73_m2} (ref >59)
GFR, EST AFRICAN AMERICAN: 83 mL/min/{1.73_m2} (ref >59)
Glucose: 105 mg/dL — ABNORMAL HIGH (ref 65–99)
POTASSIUM: 4.5 mmol/L (ref 3.5–5.2)
SODIUM: 142 mmol/L (ref 134–144)

## 2017-01-17 ENCOUNTER — Telehealth: Payer: Self-pay | Admitting: Cardiovascular Disease

## 2017-01-17 NOTE — Telephone Encounter (Signed)
[place] completed and faxed to [phone]. Confirmation received.

## 2017-01-17 NOTE — Telephone Encounter (Signed)
Patient dropped of [place].  Placed in nurse box.  Please fax and call patient to let her know this is done

## 2017-01-23 ENCOUNTER — Ambulatory Visit: Payer: Self-pay

## 2017-01-23 ENCOUNTER — Ambulatory Visit: Payer: Self-pay | Admitting: Pharmacy Technician

## 2017-01-23 ENCOUNTER — Other Ambulatory Visit: Payer: Self-pay

## 2017-01-23 VITALS — BP 114/68 | Ht 71.0 in | Wt 249.0 lb

## 2017-01-23 DIAGNOSIS — Z79899 Other long term (current) drug therapy: Secondary | ICD-10-CM
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
## 2017-01-23 NOTE — Progress Notes (Signed)
Met with patient completed financial assistance application for [facility] due to recent ED visit.  Patient agreed to be responsible for gathering financial information and forwarding to appropriate department in [facility].    Completed [facility] application and contract.  Patient agreed to all terms of the [facility] contract.  Patient to provide 2018 taxes when available.    Approved to receive medication assistance at [facility] through 2019, as long as eligibility criteria continues to be met.  Provided patient with community resource material based on her particular needs.    Ventolin and Nitrostat Prescription Applications completed with patient.  Forwarded to Dr. Rockey Situ for signature.  Upon receipt of signed applications from provider, Ventolin Prescription Application will be submitted to [place] and Nitrostat Prescription Application will be submitted to [place].  [facility]

## 2017-01-24 ENCOUNTER — Ambulatory Visit: Payer: Self-pay | Admitting: Physician Assistant

## 2017-01-24 ENCOUNTER — Ambulatory Visit: Payer: Self-pay

## 2017-02-01 ENCOUNTER — Telehealth: Payer: Self-pay | Admitting: Pharmacist

## 2017-02-01 NOTE — Telephone Encounter (Signed)
02/01/17 Faxed [employer] application for Ventolin HFA Inhale 1 puff into the lungs every 6 hours as needed for wheezing or shortness of breath.AJ 02/01/17 Faxed Pf[employer] application for Nitrostat 0.4mg  Place 1 tablet under the tongue every 5 minutes as needed for chest pain. Call 911 if no relief after 2nd tablet.[employer]

## 2017-02-05 ENCOUNTER — Ambulatory Visit: Payer: Self-pay | Admitting: Family Medicine

## 2017-02-05 VITALS — BP 138/85 | HR 95 | Temp 98.3°F | Wt 250.6 lb

## 2017-02-05 DIAGNOSIS — R609 Edema, unspecified: Secondary | ICD-10-CM

## 2017-02-05 DIAGNOSIS — I10 Essential (primary) hypertension: Secondary | ICD-10-CM

## 2017-02-05 DIAGNOSIS — Z09 Encounter for follow-up examination after completed treatment for conditions other than malignant neoplasm: Secondary | ICD-10-CM

## 2017-02-05 NOTE — Progress Notes (Signed)
Patient: Lindsey Gibson Female    DOB: 10-19-1960   57 y.o.   MRN: [id number] Visit Date: 02/05/2017  Today's Provider: Kallie Locks, FNP   Chief Complaint  Patient presents with  . New Patient (Initial Visit)   Subjective:   HPI  Patient is here for hospital follow up after being admitted for CAP. While hospitalized, she had a heart stent placed and is on Brilinta. She is accompanied by her husband today. She states that she is doing well with no complaints.   Denies chest pain, cough, shortness of breath, and hearth palpitations. Denies abdominal pain, nausea, vomiting, diarrhea, and blood in stools. Denies fevers, chills, unintended weight loss, and night sweats.   Allergies  Allergen Reactions  . Penicillins Rash    Has patient had a PCN reaction causing immediate rash, facial/tongue/throat swelling, SOB If all of the above answers are "NO", then may proceed with Cephalosporin use.   or lightheadedness with hypotension:unsure Has patient had a PCN reaction causing severe rash involving mucus membranes or skin necrosis:unsure Has patient had a PCN reaction that required hospitalization:unsure Has patient had a PCN reaction occurring within the last 10 years:unsure  Childhood reaction   Previous Medications   ALBUTEROL (PROVENTIL HFA;VENTOLIN HFA) 108 (90 BASE) MCG/ACT INHALER    Inhale 1 puff into the lungs every 6 (six) hours as needed for wheezing or shortness of breath.   ASPIRIN 81 MG CHEWABLE TABLET    Chew 1 tablet (81 mg total) by mouth daily.   ATORVASTATIN (LIPITOR) 40 MG TABLET    Take 1 tablet (40 mg total) by mouth daily at 6 PM.   CARVEDILOL (COREG) 3.125 MG TABLET    Take 1 tablet (3.125 mg total) by mouth 2 (two) times daily with a meal.   FUROSEMIDE (LASIX) 20 MG TABLET    Take 1 tablet (20 mg total) by mouth daily.   LOSARTAN (COZAAR) 25 MG TABLET    Take 1 tablet (25 mg total) by mouth daily.   NITROGLYCERIN (NITROSTAT) 0.4 MG SL TABLET    Place 1 tablet  (0.4 mg total) under the tongue every 5 (five) minutes as needed for chest pain.   POTASSIUM CHLORIDE (K-DUR) 10 MEQ TABLET    Take 1 tablet (10 mEq total) by mouth daily.   SPIRONOLACTONE (ALDACTONE) 25 MG TABLET    Take 1 tablet (25 mg total) by mouth daily.   TICAGRELOR (BRILINTA) 90 MG TABS TABLET    Take 1 tablet (90 mg total) by mouth 2 (two) times daily.    Review of Systems  Constitutional: Negative.   Eyes: Negative.   Endocrine: Negative.   Allergic/Immunologic: Negative.   All other systems reviewed and are negative.   Social History   Tobacco Use  . Smoking status: Former Smoker    Packs/day: 1.00    Years: 18.00    Pack years: 18.00    Types: Cigarettes  . Smokeless tobacco: Never Used  Substance Use Topics  . Alcohol use: No   Objective:   BP 138/85   Pulse 95   Temp 98.3 F (36.8 C)   Wt 250 lb 9.6 oz (113.7 kg)   BMI 34.95 kg/m   Physical Exam  Constitutional: She appears well-developed and well-nourished.    HENT:  Head: Normocephalic and atraumatic.  Right Ear: External ear normal.  Left Ear: External ear normal.  Mouth/Throat: Oropharynx is clear and moist.  Eyes: Conjunctivae and EOM are normal. Pupils are equal, round, and  reactive to light.  Neck: Normal range of motion. Neck supple.  Cardiovascular: Normal rate, regular rhythm, normal heart sounds and intact distal pulses.  Pulmonary/Chest: Effort normal and breath sounds normal.  Abdominal: Soft. She exhibits distension.  Musculoskeletal: Normal range of motion. She exhibits edema.  Skin: Skin is warm and dry.  Psychiatric: She has a normal mood and affect. Her behavior is normal. Judgment and thought content normal.       Assessment & Plan:   1. Hospital discharge follow-up No signs and symptoms of re-occurance of CAP. She is doing well. Lung sounds are clear, with no congestion, cough, or other adventitious sounds. Continue to use inhalers as needed. To follow up in 1 week for Lab  draw and assessment.   2. Hypertension, unspecified type BP is 138/85 today. Continue to take BP medications as directed. Monitor.   3. Edema, unspecified type Mild edema in ankles. Continue Lasix as directed. Counseled to elevate legs as needed. Monitor.   4. Follow up Follow up in 1 week for OV and lab review.      Kallie Locks, FNP   [facility]

## 2017-02-12 ENCOUNTER — Telehealth: Payer: Self-pay | Admitting: *Deleted

## 2017-02-12 NOTE — Telephone Encounter (Signed)
Pt has been enrolled [employer] Patient assistance program. 02/02/18.

## 2017-02-13 ENCOUNTER — Other Ambulatory Visit: Payer: Self-pay

## 2017-02-13 DIAGNOSIS — Z0000 Encounter for general adult medical examination without abnormal findings: Secondary | ICD-10-CM

## 2017-02-14 LAB — BASIC METABOLIC PANEL
BUN/Creatinine Ratio: 18 (ref 9–23)
BUN: 16 mg/dL (ref 6–24)
CO2: 23 mmol/L (ref 20–29)
Calcium: 10.2 mg/dL (ref 8.7–10.2)
Chloride: 101 mmol/L (ref 96–106)
Creatinine, Ser: 0.88 mg/dL (ref 0.57–1.00)
GFR calc Af Amer: 84 mL/min/{1.73_m2} (ref >59)
GFR calc non Af Amer: 73 mL/min/{1.73_m2} (ref >59)
Glucose: 90 mg/dL (ref 65–99)
Potassium: 4.4 mmol/L (ref 3.5–5.2)
Sodium: 142 mmol/L (ref 134–144)

## 2017-02-14 LAB — TROPONIN I: Troponin I: <0.01 ng/mL (ref 0.00–0.04)

## 2017-02-19 ENCOUNTER — Ambulatory Visit (INDEPENDENT_AMBULATORY_CARE_PROVIDER_SITE_OTHER): Payer: Self-pay | Admitting: Nurse Practitioner

## 2017-02-19 ENCOUNTER — Encounter: Payer: Self-pay | Admitting: Nurse Practitioner

## 2017-02-19 VITALS — BP 140/78 | HR 78 | Ht 71.0 in | Wt 250.2 lb

## 2017-02-19 DIAGNOSIS — I2510 Atherosclerotic heart disease of native coronary artery without angina pectoris: Secondary | ICD-10-CM

## 2017-02-19 DIAGNOSIS — E785 Hyperlipidemia, unspecified: Secondary | ICD-10-CM

## 2017-02-19 DIAGNOSIS — I255 Ischemic cardiomyopathy: Secondary | ICD-10-CM

## 2017-02-19 DIAGNOSIS — R030 Elevated blood-pressure reading, without diagnosis of hypertension: Secondary | ICD-10-CM

## 2017-02-19 DIAGNOSIS — I5042 Chronic combined systolic (congestive) and diastolic (congestive) heart failure: Secondary | ICD-10-CM

## 2017-02-19 MED ORDER — SACUBITRIL-VALSARTAN 24-26 MG PO TABS: 1.0000 | ORAL_TABLET | Freq: Two times a day (BID) | ORAL | 2 refills | Status: DC

## 2017-02-19 NOTE — Progress Notes (Signed)
Office Visit    Patient Name: Lindsey Gibson Date of Encounter: 02/19/2017  Primary Care Provider:  Kallie Locks, FNP Primary Cardiologist:  Lindsey Bears, MD  Chief Complaint    57 y/o ? with a history of CAD status post non-STEMI in December 2018 with stenting of LAD, ischemic cardiomyopathy, HFrEF with an EF of 20-25%, hyperlipidemia, obesity, and COPD, who presents for follow-up.  Past Medical History    Past Medical History:  Diagnosis Date  . Asthma   . CAD (coronary artery disease)    a. 12/2016 NSTEMI/PCI: LM nl, LAD 30ost, 48m (IVUS->severe plaque burden->3.0 x 18 Sierra DES), D1/2/3 nl, LCX 30p, OM1/2/3 nl, RCA nl, RPDA/RPAV/RPL1/2 nl.  . COPD (chronic obstructive pulmonary disease) (HCC)   . Hyperlipidemia   . Ischemic cardiomyopathy    a. 12/2016 Echo: EF 20-25%, mid-apicalanteroseptal, ant, and apical AK, GR1 DD, triv AI, mildly dil LA.  . Morbid obesity (HCC)    Past Surgical History:  Procedure Laterality Date  . CORONARY STENT INTERVENTION N/A 12/27/2016   Procedure: CORONARY STENT INTERVENTION;  Surgeon: Lindsey Ouch, MD;  Location: [facility] INVASIVE CV LAB;  Service: Cardiovascular;  Laterality: N/A;  . INTRAVASCULAR ULTRASOUND/IVUS N/A 12/27/2016   Procedure: Intravascular Ultrasound/IVUS;  Surgeon: Lindsey Ouch, MD;  Location: [facility] INVASIVE CV LAB;  Service: Cardiovascular;  Laterality: N/A;  . KNEE ARTHROSCOPY Right   . LEFT HEART CATH AND CORONARY ANGIOGRAPHY N/A 12/27/2016   Procedure: LEFT HEART CATH AND CORONARY ANGIOGRAPHY;  Surgeon: Lindsey Ouch, MD;  Location: [facility] INVASIVE CV LAB;  Service: Cardiovascular;  Laterality: N/A;  . SHOULDER ARTHROSCOPY WITH SUBACROMIAL DECOMPRESSION Right 11/22/2015   Procedure: SHOULDER ARTHROSCOPY WITH DEBRIDEMENT, DECOMPRESSION  AND BICEPS TENODESIS;  Surgeon: Lindsey Flake, MD;  Location: [facility] ORS;  Service: Orthopedics;  Laterality: Right;  . TONSILLECTOMY    . ULNAR NERVE TRANSPOSITION Right  11/22/2015   Procedure: ULNAR NERVE DECOMPRESSION/TRANSPOSITION;  Surgeon: Lindsey Flake, MD;  Location: [facility] ORS;  Service: Orthopedics;  Laterality: Right;    Allergies  Allergies  Allergen Reactions  . Penicillins Rash    Has patient had a PCN reaction causing immediate rash, facial/tongue/throat swelling, SOB If all of the above answers are "NO", then may proceed with Cephalosporin use.   or lightheadedness with hypotension:unsure Has patient had a PCN reaction causing severe rash involving mucus membranes or skin necrosis:unsure Has patient had a PCN reaction that required hospitalization:unsure Has patient had a PCN reaction occurring within the last 10 years:unsure  Childhood reaction    History of Present Illness    57 y/o ? with the above past medical history including remote tobacco abuse, asthma, and COPD/chronic bronchitis.  She was hospitalized in late December 2018 with acute pulmonary edema and respiratory failure with finding of non-STEMI and troponin rise to 15.33.  Echo showed LV dysfunction with an EF of 20-25%.  Diagnostic catheterization was performed and showed severe mid LAD disease which required drug-eluting stent placement.  I last saw Lindsey Gibson in clinic on January 15.  She was doing well at that time and follow-up basic metabolic panel was within normal limits.  Since her last visit, she is continued to do well.  She is walking regularly, sometimes up to 2 miles a day, without experiencing chest pain or dyspnea.  She notes occasional "twinges" of chest discomfort, typically at rest, lasting a second or so, and resolving spontaneously.  She is no longer having any significant symptoms after meals.  She will occasionally notice mild hand or ankle edema.  We did talk about her salt intake and it sounds like she is eating out at fast food restaurants on a fairly regular basis during the week.  Her weight has been stable at roughly 245 pounds on her home scale and she  has not been having any palpitations, PND, orthopnea, dizziness, syncope, or early satiety.  Home Medications    Prior to Admission medications   Medication Sig Start Date End Date Taking? Authorizing Provider  albuterol (PROVENTIL HFA;VENTOLIN HFA) 108 (90 Base) MCG/ACT inhaler Inhale 1 puff into the lungs every 6 (six) hours as needed for wheezing or shortness of breath. 12/29/16  Yes Lindsey Finner, MD  aspirin 81 MG chewable tablet Chew 1 tablet (81 mg total) by mouth daily. 12/29/16  Yes Lindsey Finner, MD  atorvastatin (LIPITOR) 40 MG tablet Take 1 tablet (40 mg total) by mouth daily at 6 PM. 12/29/16  Yes Lindsey Finner, MD  carvedilol (COREG) 3.125 MG tablet Take 1 tablet (3.125 mg total) by mouth 2 (two) times daily with a meal. 12/29/16  Yes Lindsey Finner, MD  furosemide (LASIX) 20 MG tablet Take 1 tablet (20 mg total) by mouth daily. 01/07/17 04/07/17 Yes Gollan, Tollie Pizza, MD  losartan (COZAAR) 25 MG tablet Take 1 tablet (25 mg total) by mouth daily. 12/29/16  Yes Lindsey Finner, MD  nitroGLYCERIN (NITROSTAT) 0.4 MG SL tablet Place 1 tablet (0.4 mg total) under the tongue every 5 (five) minutes as needed for chest pain. 01/15/17 04/15/17 Yes Lindsey Hines, NP  potassium chloride (K-DUR) 10 MEQ tablet Take 1 tablet (10 mEq total) by mouth daily. 01/07/17 04/07/17 Yes Gollan, Tollie Pizza, MD  spironolactone (ALDACTONE) 25 MG tablet Take 1 tablet (25 mg total) by mouth daily. 01/07/17 04/07/17 Yes Gollan, Tollie Pizza, MD  ticagrelor (BRILINTA) 90 MG TABS tablet Take 1 tablet (90 mg total) by mouth 2 (two) times daily. 12/29/16  Yes Lindsey Finner, MD    Review of Systems    Occasional brief twinges of central chest discomfort lasting a second or so and resolving spontaneously.  Occasional hand and ankle edema which she describes as being mild and resolving within half a day.  No angina, dyspnea, palpitations, PND, orthopnea, dizziness, syncope, or early satiety.  All other systems reviewed and are otherwise  negative except as noted above.  Physical Exam    VS:  BP 140/78 (BP Location: Left Arm, Patient Position: Sitting, Cuff Size: Normal)   Pulse 78   Ht 5\' 11"  (1.803 m)   Wt 250 lb 4 oz (113.5 kg)   BMI 34.90 kg/m  , BMI Body mass index is 34.9 kg/m. GEN: Well nourished, well developed, in no acute distress.  HEENT: normal.  Neck: Supple, no JVD, carotid bruits, or masses. Cardiac: RRR, no murmurs, rubs, or gallops. No clubbing, cyanosis, edema.  Radials/DP/PT 2+ and equal bilaterally.  Respiratory:  Respirations regular and unlabored, clear to auscultation bilaterally. GI: Soft, nontender, nondistended, BS + x 4. MS: no deformity or atrophy. Skin: warm and dry, no rash. Neuro:  Strength and sensation are intact. Psych: Normal affect.  Accessory Clinical Findings    ECG -regular sinus rhythm, 78, no acute ST or T changes.  Assessment & Plan    1.  Coronary artery disease: Status post non-STEMI in late December 2018 with presenting symptoms of acute pulmonary edema and respiratory failure.  EF 20-25% by echo while catheterization showed an ulcerated plaque in the LAD  which was successfully treated with a drug-eluting stent.  She has continued to do well since her last visit in January.  She remains compliant with her medications and we were able to get her set up for the [employer] assistance program.  She remains on aspirin, statin, beta-blocker, and ARB.  She does not have insurance and as a result will not be participating in cardiac rehabilitation but has been walking up to 2 miles daily without significant limitations.  2.  Ischemic cardiomyopathy/chronic combined systolic and diastolic congestive heart failure: EF 20-25% echo in December in the setting of non-STEMI.  Her weight has been stable at home and she has been tolerating walking well.  She is euvolemic on exam today.  She is currently on beta-blocker, ARB, spironolactone, and Lasix therapy.  Recent basic metabolic panels have  been within normal limits.  I am going to switch her from losartan to [facility] and she has been provided with samples and a coupon today.  We will try and get her into the assistance program/foundation through [employer].  She has been eating fast food fairly regularly and as a result, has been noticing occasional hand or ankle swelling.  This is typically mild by her description.  We discussed the importance of daily weights, sodium restriction, medication compliance, and symptom reporting and she verbalizes understanding.  I recommended she avoid fast and restaurant foods.  I will follow-up a basic metabolic panel in 1 week since we are switching her to [facility].  I will also arrange for an echocardiogram in early April as it will have been 90 days since revascularization.  3.  Hyperlipidemia: LDL was 115 during admission in late December.  She remains on high potency statin therapy and is tolerating this well.  As I am following up labs next week, I will obtain LFTs and lipids at that time.  4.  Elevated blood pressure: Pressure 140/78 today.  She does not carry a prior diagnosis of hypertension but has been tolerating beta-blocker, ARB, and diuretic therapy.  She says her blood pressure at home is typically in the 1 teens to 120s.  I have asked her to continue to watch her blood pressure at home considering that we are switching her from losartan to Entresto 24-26 mg twice daily.  5.  Morbid obesity: Patient has not lost any weight since December.  Unfortunately, she will not be able to afford cardiac rehabilitation.  She has been walking at home regularly.  I did recommend that she begin looking at her caloric intake and trying to reduce calories with a goal to lose weight.  6.  Disposition: Follow-up a complete metabolic panel and lipids in 1 week.  Follow-up echocardiogram in early April.  Follow-up with Dr. Kirke Corin in mid to late April.   Nicolasa Ducking, NP 02/19/2017, 10:00 AM

## 2017-02-19 NOTE — Patient Instructions (Addendum)
Medication Instructions:  Your physician has recommended you make the following change in your medication:  1- STOP Losartan. 2- START Entresto 24/26 mg (1 tablet) by mouth two times a day.  You will be provided with: [facility] Patient Assistance Application  [employer] [phone]    Please follow up with Medication Management as well for assistance.    Labwork: Your physician recommends that you return for lab work in: 1 WEEK IN THE OFFICE. PLEASE BE FASTING.   Testing/Procedures: Your physician has requested that you have an echocardiogram. Echocardiography is a painless test that uses sound waves to create images of your heart. It provides your doctor with information about the size and shape of your heart and how well your heart's chambers and valves are working. This procedure takes approximately one hour. There are no restrictions for this procedure. SOMETIME THE FIRST WEEK OF April.    Follow-Up: Your physician recommends that you schedule a follow-up appointment in: SOMETIME MID April AFTER ECHO WITH DR ARIDA.    If you need a refill on your cardiac medications before your next appointment, please call your pharmacy.   Echocardiogram An echocardiogram, or echocardiography, uses sound waves (ultrasound) to produce an image of your heart. The echocardiogram is simple, painless, obtained within a short period of time, and offers valuable information to your health care provider. The images from an echocardiogram can provide information such as:  Evidence of coronary artery disease (CAD).  Heart size.  Heart muscle function.  Heart valve function.  Aneurysm detection.  Evidence of a past heart attack.  Fluid buildup around the heart.  Heart muscle thickening.  Assess heart valve function.  Tell a health care provider about:  Any allergies you have.  All medicines you are taking, including vitamins, herbs, eye drops, creams, and over-the-counter  medicines.  Any problems you or family members have had with anesthetic medicines.  Any blood disorders you have.  Any surgeries you have had.  Any medical conditions you have.  Whether you are pregnant or may be pregnant. What happens before the procedure? No special preparation is needed. Eat and drink normally. What happens during the procedure?  In order to produce an image of your heart, gel will be applied to your chest and a wand-like tool (transducer) will be moved over your chest. The gel will help transmit the sound waves from the transducer. The sound waves will harmlessly bounce off your heart to allow the heart images to be captured in real-time motion. These images will then be recorded.  You may need an IV to receive a medicine that improves the quality of the pictures. What happens after the procedure? You may return to your normal schedule including diet, activities, and medicines, unless your health care provider tells you otherwise. This information is not intended to replace advice given to you by your health care provider. Make sure you discuss any questions you have with your health care provider. Document Released: 12/16/1999 Document Revised: 08/06/2015 Document Reviewed: 08/25/2012 Elsevier Interactive Patient Education  2017 Elsevier Inc.   Medication Samples have been provided to the patient.  Drug name: Sherryll Burger       Strength: 24/26mg         Qty: 1 box  LOT: [id number]  Exp.Date: sep 2020

## 2017-02-22 ENCOUNTER — Telehealth: Payer: Self-pay | Admitting: Nurse Practitioner

## 2017-02-22 MED ORDER — SPIRONOLACTONE 25 MG PO TABS: 12.5000 mg | ORAL_TABLET | Freq: Every day | ORAL | Status: DC

## 2017-02-22 NOTE — Telephone Encounter (Signed)
I spoke with the patient. She was prescribed entresto 24/26 mg BID on 2/19. She states her SBP over the last couple of days has been 120-122, but today she has been: 8am- 99/55 9:15 am- 112 systolic Around 12:30 pm- 117 systolic.  Her chest tightness started today with the low blood pressure- she has had some lightheadedness today. I advised the patient that her low BP may be contributing to her other symptoms. I advised I will forward a message to Thayer Ohm, NP & Dr. Kirke Corin and call her back with recommendations- she is agreeable.

## 2017-02-22 NOTE — Telephone Encounter (Signed)
I left a detailed message of Dr. Jari Sportsman recommendations on the patient's identified voice mail (ok per DPR).  I asked that she call back to confirm.

## 2017-02-22 NOTE — Telephone Encounter (Signed)
I spoke with the patient. She is aware to continue entresto & decrease spironolactone to 12.5 mg once daily. She is also aware to increase her fluid intake. She is already scheduled for a lipid/ CMET on Tuesday next week- we will check labs as scheduled. The patient voices understanding and is agreeable.

## 2017-02-22 NOTE — Telephone Encounter (Signed)
Pt states ever since she started Entresto on 2/19, he BP has been low. Today 99/65, today 112/60. Please call.   Yesterday it was in the 122s. States she has had some CP with this. Pt c/o of Chest Pain: STAT if CP now or developed within 24 hours  1. Are you having CP right now? Not sharp, just a dull ache  2. Are you experiencing any other symptoms (ex. SOB, nausea, vomiting, sweating)? No   3. How long have you been experiencing CP? Just today with her low BP  4. Is your CP continuous or coming and going? continual  5. Have you taken Nitroglycerin? no ?

## 2017-02-22 NOTE — Telephone Encounter (Signed)
It is not unusual for blood pressure to decrease after starting Entresto.  I recommend decreasing spironolactone to 12.5 mg once daily.  Increase fluid intake over the next few days and check basic metabolic profile on Monday.

## 2017-02-26 ENCOUNTER — Telehealth: Payer: Self-pay | Admitting: Cardiovascular Disease

## 2017-02-26 ENCOUNTER — Other Ambulatory Visit (INDEPENDENT_AMBULATORY_CARE_PROVIDER_SITE_OTHER): Payer: Self-pay | Admitting: *Deleted

## 2017-02-26 ENCOUNTER — Telehealth: Payer: Self-pay | Admitting: Pharmacist

## 2017-02-26 DIAGNOSIS — E785 Hyperlipidemia, unspecified: Secondary | ICD-10-CM

## 2017-02-26 DIAGNOSIS — I5042 Chronic combined systolic (congestive) and diastolic (congestive) heart failure: Secondary | ICD-10-CM

## 2017-02-26 MED ORDER — LOSARTAN POTASSIUM 25 MG PO TABS: 25.0000 mg | ORAL_TABLET | Freq: Every day | ORAL | 3 refills | Status: DC

## 2017-02-26 NOTE — Telephone Encounter (Signed)
It's hard to know if entresto is causing her c/p episodes, though it would not be a common side effect.  If she is still having dizziness, she can stop entresto and resume losartan tomorrow and see if both dizziness and c/p symptoms improve.  Based on our discussion, it sounds like c/p episodes are brief and atypical.  If however, she is having more prolonged bouts of c/p, she should be evaluated by EMS

## 2017-02-26 NOTE — Telephone Encounter (Signed)
Patient had recent med change due to bp issues and brought in a log after starting Entresto.  Patient assistance forms also received and placed in nurse box    Date  Time    BP  2/22  Morning  99/67   Mid morning  100/70   Noon   112/72   Evening  112/72  2/23  Morning   100/67   Mid morning   112/70   Noon    115/67   Evening   112/72  2/24  Morning   99/72   Mid morning   100/67   Noon    115/72   Evening   117/67  2/25  Morning  112/67   Mid morning   122/72   Noon    115/67   Evening   112/76  2/26  Morning  120/72

## 2017-02-26 NOTE — Telephone Encounter (Signed)
02/26/2017 2:04:00 PM - [facility]  02/26/17 Received pharmacy printout for Sherryll Burger 24/26 Take one tablet by mouth 2 times a day, printed [employer] application. I have also received a message in EPIC from Onnie Boer @ [facility] on this medication she has PAP application signed by the provider already for me to pick up. I will mail patient her portion to sign and return.Forde Radon

## 2017-02-26 NOTE — Telephone Encounter (Signed)
S/w patient. She says she was not having this pain until about 1 week ago when she started the [place]. Its only lasts a few seconds. She did not have the chest pain either when she was on losartan.  She prefers to go back on losartan.  Pt verbalized understanding to call 911 or go to the emergency room, if he develops any new or worsening symptoms. Rx sent to pharmacy and [facility] d/c'd.

## 2017-02-26 NOTE — Telephone Encounter (Signed)
Patient left her blood pressure readings for [place]. See previous entry. She does experience some dizziness through out the day. After she takes her Sherryll Burger, she says she gets chest tightness.  Its a 5/10 pain level. It occurs 3-4 times throughout the day and lasts a few moments.  She noticed it started happening more when she began the [place] even though she did have this feeling some before.  Routing to Ward Givens , NP for advice.   She also dropped of Entresto PAF's as she does not have insurance. Annette with Medication Management is to come by to pick up forms and signed prescription to continue to assist patient in this process.

## 2017-02-27 ENCOUNTER — Encounter: Payer: Self-pay | Admitting: Internal Medicine

## 2017-02-27 ENCOUNTER — Other Ambulatory Visit: Payer: Self-pay | Admitting: *Deleted

## 2017-02-27 ENCOUNTER — Ambulatory Visit: Payer: Self-pay | Admitting: Internal Medicine

## 2017-02-27 VITALS — BP 126/72 | HR 69 | Temp 98.2°F | Wt 249.8 lb

## 2017-02-27 DIAGNOSIS — E785 Hyperlipidemia, unspecified: Secondary | ICD-10-CM

## 2017-02-27 DIAGNOSIS — Z79899 Other long term (current) drug therapy: Secondary | ICD-10-CM

## 2017-02-27 DIAGNOSIS — I259 Chronic ischemic heart disease, unspecified: Secondary | ICD-10-CM

## 2017-02-27 LAB — LIPID PANEL
CHOL/HDL RATIO: 3.4 ratio (ref 0.0–4.4)
Cholesterol, Total: 142 mg/dL (ref 100–199)
HDL: 42 mg/dL (ref >39)
LDL Calculated: 77 mg/dL (ref 0–99)
Triglycerides: 113 mg/dL (ref 0–149)
VLDL Cholesterol Cal: 23 mg/dL (ref 5–40)

## 2017-02-27 LAB — COMPREHENSIVE METABOLIC PANEL
ALT: 16 IU/L (ref 0–32)
AST: 17 IU/L (ref 0–40)
Albumin/Globulin Ratio: 1.6 (ref 1.2–2.2)
Albumin: 4.3 g/dL (ref 3.5–5.5)
Alkaline Phosphatase: 91 IU/L (ref 39–117)
BUN/Creatinine Ratio: 16 (ref 9–23)
BUN: 15 mg/dL (ref 6–24)
Bilirubin Total: 1.5 mg/dL — ABNORMAL HIGH (ref 0.0–1.2)
CALCIUM: 9.1 mg/dL (ref 8.7–10.2)
CO2: 22 mmol/L (ref 20–29)
CREATININE: 0.93 mg/dL (ref 0.57–1.00)
Chloride: 103 mmol/L (ref 96–106)
GFR calc Af Amer: 79 mL/min/{1.73_m2} (ref >59)
GFR, EST NON AFRICAN AMERICAN: 68 mL/min/{1.73_m2} (ref >59)
GLUCOSE: 101 mg/dL — AB (ref 65–99)
Globulin, Total: 2.7 g/dL (ref 1.5–4.5)
Potassium: 4.7 mmol/L (ref 3.5–5.2)
Sodium: 142 mmol/L (ref 134–144)
Total Protein: 7 g/dL (ref 6.0–8.5)

## 2017-02-27 MED ORDER — LORATADINE 10 MG PO TABS: 10.0000 mg | ORAL_TABLET | Freq: Every day | ORAL | 2 refills | Status: DC

## 2017-02-27 MED ORDER — ATORVASTATIN CALCIUM 80 MG PO TABS: 80.0000 mg | ORAL_TABLET | Freq: Every day | ORAL | 3 refills | Status: DC

## 2017-02-27 NOTE — Progress Notes (Signed)
   Subjective:    Patient ID: Lindsey Gibson, female    DOB: 1960/05/17, 57 y.o.   MRN: [id number]  HPI   Pt is here for f/u on lab results. She currently sees Dr. Kirke Corin over at [facility].     Allergies as of 02/27/2017      Reactions   Penicillins Rash   Has patient had a PCN reaction causing immediate rash, facial/tongue/throat swelling, SOB If all of the above answers are "NO", then may proceed with Cephalosporin use.  or lightheadedness with hypotension:unsure Has patient had a PCN reaction causing severe rash involving mucus membranes or skin necrosis:unsure Has patient had a PCN reaction that required hospitalization:unsure Has patient had a PCN reaction occurring within the last 10 years:unsure Childhood reaction      Medication List        Accurate as of 02/27/17 10:38 AM. Always use your most recent med list.          albuterol 108 (90 Base) MCG/ACT inhaler Commonly known as:  PROVENTIL HFA;VENTOLIN HFA Inhale 1 puff into the lungs every 6 (six) hours as needed for wheezing or shortness of breath.   aspirin 81 MG chewable tablet Chew 1 tablet (81 mg total) by mouth daily.   atorvastatin 80 MG tablet Commonly known as:  LIPITOR Take 1 tablet (80 mg total) by mouth daily.   carvedilol 3.125 MG tablet Commonly known as:  COREG Take 1 tablet (3.125 mg total) by mouth 2 (two) times daily with a meal.   furosemide 20 MG tablet Commonly known as:  LASIX Take 1 tablet (20 mg total) by mouth daily.   losartan 25 MG tablet Commonly known as:  COZAAR Take 1 tablet (25 mg total) by mouth daily.   nitroGLYCERIN 0.4 MG SL tablet Commonly known as:  NITROSTAT Place 1 tablet (0.4 mg total) under the tongue every 5 (five) minutes as needed for chest pain.   potassium chloride 10 MEQ tablet Commonly known as:  K-DUR Take 1 tablet (10 mEq total) by mouth daily.   spironolactone 25 MG tablet Commonly known as:  ALDACTONE Take 0.5 tablets (12.5  mg total) by mouth daily.   ticagrelor 90 MG Tabs tablet Commonly known as:  BRILINTA Take 1 tablet (90 mg total) by mouth 2 (two) times daily.      Patient Active Problem List   Diagnosis Date Noted  . Pneumonia 12/27/2016  . NSTEMI (non-ST elevated myocardial infarction) (HCC)      Review of Systems     Objective:   Physical Exam  Constitutional: She is oriented to person, place, and time.  Cardiovascular: Normal rate, regular rhythm and normal heart sounds.  Pulmonary/Chest: Effort normal and breath sounds normal.  Neurological: She is alert and oriented to person, place, and time.    BP 126/72   Pulse 69   Temp 98.2 F (36.8 C)   Wt 249 lb 12.8 oz (113.3 kg)   BMI 34.84 kg/m      Assessment & Plan:   F/u appointment in 3 months.  Prescribe loratadine for allergies (10mg  daily).

## 2017-02-27 NOTE — Telephone Encounter (Signed)
Annette from Medication management picked up [facility] Patient Assistance forms to keep on file in case patient resumes Entresto at some point.

## 2017-04-01 ENCOUNTER — Telehealth: Payer: Self-pay | Admitting: Pharmacist

## 2017-04-01 NOTE — Telephone Encounter (Signed)
04/01/2017 8:56:57 AM - Ventolin HFA refill  04/01/17 Placed refill online with [facility] for Ventolin HFA, to release 05/03/17, order# [id number]. Forde Radon

## 2017-04-02 ENCOUNTER — Ambulatory Visit (INDEPENDENT_AMBULATORY_CARE_PROVIDER_SITE_OTHER): Payer: Self-pay

## 2017-04-02 ENCOUNTER — Other Ambulatory Visit: Payer: Self-pay

## 2017-04-02 DIAGNOSIS — I255 Ischemic cardiomyopathy: Secondary | ICD-10-CM

## 2017-04-03 ENCOUNTER — Telehealth: Payer: Self-pay | Admitting: Pharmacist

## 2017-04-03 NOTE — Telephone Encounter (Signed)
04/03/2017 12:53:56 PM - Nitrostat refill  04/03/17 Called [facility] spoke with Njeri for refill on Nitrostat 0.4mg  will release 04/13/17, allow 7-10 business days to receive from 04/13/17. 2 refills left-next refill due 06/20/17. [phone].Forde Radon

## 2017-04-11 ENCOUNTER — Ambulatory Visit (INDEPENDENT_AMBULATORY_CARE_PROVIDER_SITE_OTHER): Payer: Self-pay | Admitting: Cardiovascular Disease

## 2017-04-11 ENCOUNTER — Encounter: Payer: Self-pay | Admitting: Cardiovascular Disease

## 2017-04-11 VITALS — BP 130/80 | HR 71 | Ht 71.0 in | Wt 253.0 lb

## 2017-04-11 DIAGNOSIS — Z79899 Other long term (current) drug therapy: Secondary | ICD-10-CM

## 2017-04-11 DIAGNOSIS — I2510 Atherosclerotic heart disease of native coronary artery without angina pectoris: Secondary | ICD-10-CM

## 2017-04-11 DIAGNOSIS — I10 Essential (primary) hypertension: Secondary | ICD-10-CM

## 2017-04-11 DIAGNOSIS — I5022 Chronic systolic (congestive) heart failure: Secondary | ICD-10-CM

## 2017-04-11 DIAGNOSIS — E785 Hyperlipidemia, unspecified: Secondary | ICD-10-CM

## 2017-04-11 MED ORDER — FUROSEMIDE 40 MG PO TABS: 40.0000 mg | ORAL_TABLET | Freq: Every day | ORAL | 3 refills | Status: DC

## 2017-04-11 MED ORDER — SPIRONOLACTONE 25 MG PO TABS: 25.0000 mg | ORAL_TABLET | Freq: Every day | ORAL | 2 refills | Status: DC

## 2017-04-11 NOTE — Patient Instructions (Addendum)
Medication Instructions:  Your physician has recommended you make the following change in your medication:  INCREASE lasix to 40mg  once daily Your  Medication list has been updated to reflect spironolactone 25mg  once daily   Labwork: Lipid, liver, and BMET in one week at the [facility] lab. No appointment needed.  Nothing to eat or drink after midnight the evening before your labs.   Testing/Procedures: none  Follow-Up: Your physician recommends that you schedule a follow-up appointment in: 3 months with Dr. Kirke Corin.    Any Other Special Instructions Will Be Listed Below (If Applicable).     If you need a refill on your cardiac medications before your next appointment, please call your pharmacy.

## 2017-04-11 NOTE — Progress Notes (Signed)
Cardiology Office Note   Date:  04/11/2017   ID:  Lindsey Gibson, DOB 23-Jun-1960, MRN [id number]  PCP:  Kallie Locks, FNP  Cardiologist:   Lorine Bears, MD   Chief Complaint  Patient presents with  . other    1 month & follow up from Echo. Meds reviewed by the pt. verbally. Pt. c/o cramping in hands with her hands and feet swelling.        History of Present Illness: Lindsey Gibson is a 57 y.o. female who presents for a follow-up visit regarding coronary artery disease and chronic systolic heart failure.  Other medical problems include hyperlipidemia, obesity and COPD. She was hospitalized in December with pulmonary edema and non-ST elevation myocardial infarction.  Echocardiogram showed an EF of 20-25%.  Cardiac catheterization showed severe mid LAD disease which was treated with PCI and drug-eluting stent placement. During last visit, she was switched from losartan to [facility].  She did not tolerate Entresto due to hypotension and recurrent chest pain.  She is back on losartan.  She underwent a repeat echocardiogram earlier this month which showed improvement in EF to 50-55%.  There was evidence of grade 2 diastolic dysfunction.  She feels very well with stable symptoms overall.  However, she reports increased leg edema and 5 pounds weight gain since February.  Past Medical History:  Diagnosis Date  . Asthma   . CAD (coronary artery disease)    a. 12/2016 NSTEMI/PCI: LM nl, LAD 30ost, 61m (IVUS->severe plaque burden->3.0 x 18 Sierra DES), D1/2/3 nl, LCX 30p, OM1/2/3 nl, RCA nl, RPDA/RPAV/RPL1/2 nl.  . COPD (chronic obstructive pulmonary disease) (HCC)   . Hyperlipidemia   . Ischemic cardiomyopathy    a. 12/2016 Echo: EF 20-25%, mid-apicalanteroseptal, ant, and apical AK, GR1 DD, triv AI, mildly dil LA.  . Morbid obesity (HCC)     Past Surgical History:  Procedure Laterality Date  . CORONARY STENT INTERVENTION N/A 12/27/2016   Procedure: CORONARY STENT  INTERVENTION;  Surgeon: Iran Ouch, MD;  Location: [facility] INVASIVE CV LAB;  Service: Cardiovascular;  Laterality: N/A;  . INTRAVASCULAR ULTRASOUND/IVUS N/A 12/27/2016   Procedure: Intravascular Ultrasound/IVUS;  Surgeon: Iran Ouch, MD;  Location: [facility]VAS[facility] CV [facility];  Service: Cardiovascular;  Laterality: N/A;  . KNEE ARTHROSCOPY Right   . LEFT HEART CATH AND CORONARY ANGIOGRAPHY N/A 12/27/2016   Procedure: LEFT HEART CATH AND CORONARY ANGIOGRAPHY;  Surgeon: Iran Ouch, MD;  Location: [facility] INVAS[facility] CV LAB;  Service: Cardiovascular;  Laterality: N/A;  . SHOULDER ARTHROSCOPY WITH SUBACROMIAL DECOMPRESSION Right 11/22/2015   Procedure: SHOULDER ARTHROSCOPY WITH DEBRIDEMENT, DECOMPRESSION  AND BICEPS TENODESIS;  Surgeon: Christena Flake, MD;  Location: [facility] ORS;  Service: Orthopedics;  Laterality: Right;  . TONSILLECTOMY    . ULNAR NERVE TRANSPOSITION Right 11/22/2015   Procedure: ULNAR NERVE DECOMPRESSION/TRANSPOSITION;  Surgeon: Christena Flake, MD;  Location: [facility] ORS;  Service: Orthopedics;  Laterality: Right;     Current Outpatient Medications  Medication Sig Dispense Refill  . albuterol (PROVENTIL HFA;VENTOLIN HFA) 108 (90 Base) MCG/ACT inhaler Inhale 1 puff into the lungs every 6 (six) hours as needed for wheezing or shortness of breath. 1 Inhaler 1  . aspirin 81 MG chewable tablet Chew 1 tablet (81 mg total) by mouth daily. 30 tablet 3  . atorvastatin (LIPITOR) 80 MG tablet Take 1 tablet (80 mg total) by mouth daily. 90 tablet 3  . carvedilol (COREG) 3.125 MG tablet Take 1 tablet (3.125 mg total) by mouth  2 (two) times daily with a meal. 60 tablet 3  . furosemide (LASIX) 20 MG tablet Take 1 tablet (20 mg total) by mouth daily. 30 tablet 6  . loratadine (CLARITIN) 10 MG tablet Take 1 tablet (10 mg total) by mouth daily. 90 tablet 2  . losartan (COZAAR) 25 MG tablet Take 1 tablet (25 mg total) by mouth daily. 90 tablet 3  . nitroGLYCERIN (NITROSTAT) 0.4 MG SL tablet Place 1  tablet (0.4 mg total) under the tongue every 5 (five) minutes as needed for chest pain. 25 tablet 3  . potassium chloride (K-DUR) 10 MEQ tablet Take 1 tablet (10 mEq total) by mouth daily. 30 tablet 6  . spironolactone (ALDACTONE) 25 MG tablet Take 0.5 tablets (12.5 mg total) by mouth daily.    . ticagrelor (BRILINTA) 90 MG TABS tablet Take 1 tablet (90 mg total) by mouth 2 (two) times daily. 60 tablet 2   No current facility-administered medications for this visit.     Allergies:   Penicillins    Social History:  The patient  reports that she has quit smoking. Her smoking use included cigarettes. She has a 18.00 pack-year smoking history. She has never used smokeless tobacco. She reports that she does not drink alcohol or use drugs.   Family History:  The patient's family history includes Diabetes in her mother and sister; Heart disease in her mother.    ROS:  Please see the history of present illness.   Otherwise, review of systems are positive for none.   All other systems are reviewed and negative.    PHYSICAL EXAM: VS:  BP 130/80 (BP Location: Left Arm, Patient Position: Sitting, Cuff Size: Normal)   Pulse 71   Ht 5\' 11"  (1.803 m)   Wt 253 lb (114.8 kg)   BMI 35.29 kg/m  , BMI Body mass index is 35.29 kg/m. GEN: Well nourished, well developed, in no acute distress  HEENT: normal  Neck: no JVD, carotid bruits, or masses Cardiac: RRR; no murmurs, rubs, or gallops, mild bilateral edema  Respiratory:  clear to auscultation bilaterally, normal work of breathing GI: soft, nontender, nondistended, + BS MS: no deformity or atrophy  Skin: warm and dry, no rash Neuro:  Strength and sensation are intact Psych: euthymic mood, full affect   EKG:  EKG is not ordered today.    Recent Labs: 12/27/2016: Magnesium 2.1 12/28/2016: Hemoglobin 12.2; Platelets 202 02/26/2017: ALT 16; BUN 15; Creatinine, Ser 0.93; Potassium 4.7; Sodium 142    Lipid Panel    Component Value Date/Time     CHOL 142 02/26/2017 0827   TRIG 113 02/26/2017 0827   HDL 42 02/26/2017 0827   CHOLHDL 3.4 02/26/2017 0827   CHOLHDL 4.7 12/28/2016 0642   VLDL 24 12/28/2016 0642   LDLCALC 77 02/26/2017 0827      Wt Readings from Last 3 Encounters:  04/11/17 253 lb (114.8 kg)  02/27/17 249 lb 12.8 oz (113.3 kg)  02/19/17 250 lb 4 oz (113.5 kg)       No flowsheet data found.    ASSESSMENT AND PLAN:  1.  Coronary artery disease involving native coronary arteries without angina: Continue dual antiplatelet therapy until December 2019.  She reports easy bruising with Brilinta and the plan is to stop the medication in December of this year.  2.  Chronic systolic heart failure: Ejection fraction improved to 50-55%.  She appears to be mildly volume overloaded with weight gain and increased leg edema.  I elected  to increase furosemide to 40 mg once daily.  Check basic metabolic profile in 1 week.  3.  Hyperlipidemia: Continue high-dose atorvastatin.  I requested a follow-up lipid and liver profile.  4.  Essential hypertension: Blood pressure is well controlled on current medications.    Disposition:   FU with me in 3 months  Signed,  Lorine Bears, MD  04/11/2017 8:17 AM    [facility]

## 2017-04-16 ENCOUNTER — Other Ambulatory Visit: Payer: Self-pay | Admitting: Cardiovascular Disease

## 2017-04-16 ENCOUNTER — Other Ambulatory Visit
Admission: RE | Admit: 2017-04-16 | Discharge: 2017-04-16 | Disposition: A | Discharge status: Home / Self Care | Payer: Self-pay | Source: Ambulatory Visit | Attending: Cardiovascular Disease | Admitting: Cardiovascular Disease

## 2017-04-16 ENCOUNTER — Other Ambulatory Visit: Payer: Self-pay

## 2017-04-16 ENCOUNTER — Telehealth: Payer: Self-pay | Admitting: Cardiovascular Disease

## 2017-04-16 DIAGNOSIS — Z79899 Other long term (current) drug therapy: Secondary | ICD-10-CM | POA: Insufficient documentation

## 2017-04-16 LAB — BASIC METABOLIC PANEL
Anion gap: 8 (ref 5–15)
BUN: 19 mg/dL (ref 6–20)
CALCIUM: 8.8 mg/dL — AB (ref 8.9–10.3)
CO2: 26 mmol/L (ref 22–32)
Chloride: 99 mmol/L — ABNORMAL LOW (ref 101–111)
Creatinine, Ser: 0.99 mg/dL (ref 0.44–1.00)
GFR calc Af Amer: >60 mL/min (ref >60)
GLUCOSE: 104 mg/dL — AB (ref 65–99)
Potassium: 3.7 mmol/L (ref 3.5–5.1)
Sodium: 133 mmol/L — ABNORMAL LOW (ref 135–145)

## 2017-04-16 MED ORDER — SPIRONOLACTONE 25 MG PO TABS: 25.0000 mg | ORAL_TABLET | Freq: Every day | ORAL | 0 refills | Status: DC

## 2017-04-16 MED ORDER — CARVEDILOL 3.125 MG PO TABS: 3.1250 mg | ORAL_TABLET | Freq: Two times a day (BID) | ORAL | 0 refills | Status: DC

## 2017-04-16 NOTE — Telephone Encounter (Signed)
Requested Prescriptions   Signed Prescriptions Disp Refills  . spironolactone (ALDACTONE) 25 MG tablet 90 tablet 0    Sig: Take 1 tablet (25 mg total) by mouth daily.    Authorizing Provider: Lorine Bears A    Ordering User: Margrett Rud carvedilol (COREG) 3.125 MG tablet 180 tablet 0    Sig: Take 1 tablet (3.125 mg total) by mouth 2 (two) times daily with a meal.    Authorizing Provider: Lorine Bears A    Ordering User: Margrett Rud

## 2017-04-16 NOTE — Telephone Encounter (Signed)
°*  STAT* If patient is at the pharmacy, call can be transferred to refill team.   1. Which medications need to be refilled? (please list name of each medication and dose if known) Carvadilol and Spirolactone   2. Which pharmacy/location (including street and city if local pharmacy) is medication to be sent to?[facility]   3. Do they need a 30 day or 90 day supply? 90 day

## 2017-04-16 NOTE — Telephone Encounter (Signed)
Requested Prescriptions   Signed Prescriptions Disp Refills  . spironolactone (ALDACTONE) 25 MG tablet 90 tablet 0    Sig: Take 1 tablet (25 mg total) by mouth daily.    Authorizing Provider: ARIDA, MUHAMMAD A    Ordering User: Martha Soltys N  . carvedilol (COREG) 3.125 MG tablet 180 tablet 0    Sig: Take 1 tablet (3.125 mg total) by mouth 2 (two) times daily with a meal.    Authorizing Provider: ARIDA, MUHAMMAD A    Ordering User: Janera Peugh N    

## 2017-04-16 NOTE — Telephone Encounter (Signed)
°*  STAT* If patient is at the pharmacy, call can be transferred to refill team.   1. Which medications need to be refilled? (please list name of each medication and dose if known) spironolactone 25 MG and potassium chloride 10 MEQ  2. Which pharmacy/location (including street and city if local pharmacy) is medication to be sent to? [facility]  3. Do they need a 30 day or 90 day supply? 90 day   Patient states they received the carvedilol from the last request but not the spironolactone so it will need to be sent in again

## 2017-04-17 ENCOUNTER — Other Ambulatory Visit: Payer: Self-pay

## 2017-04-17 MED ORDER — SPIRONOLACTONE 25 MG PO TABS: 25.0000 mg | ORAL_TABLET | Freq: Every day | ORAL | 0 refills | Status: DC

## 2017-04-17 MED ORDER — POTASSIUM CHLORIDE ER 10 MEQ PO TBCR: 10.0000 meq | EXTENDED_RELEASE_TABLET | Freq: Every day | ORAL | 0 refills | Status: DC

## 2017-04-17 MED ORDER — CARVEDILOL 3.125 MG PO TABS: 3.1250 mg | ORAL_TABLET | Freq: Two times a day (BID) | ORAL | 0 refills | Status: DC

## 2017-04-17 NOTE — Telephone Encounter (Signed)
°*  STAT* If patient is at the pharmacy, call can be transferred to refill team.   1. Which medications need to be refilled? (please list name of each medication and dose if known) Spironolactone 25 MG  2. Which pharmacy/location (including street and city if local pharmacy) is medication to be sent to? [facility]   3. Do they need a 30 day or 90 day supply? 90 day

## 2017-04-17 NOTE — Telephone Encounter (Signed)
Requested Prescriptions   Signed Prescriptions Disp Refills  . potassium chloride (K-DUR) 10 MEQ tablet 90 tablet 0    Sig: Take 1 tablet (10 mEq total) by mouth daily.    Authorizing Provider: ARIDA, MUHAMMAD A    Ordering User: Jerell Demery N  . carvedilol (COREG) 3.125 MG tablet 180 tablet 0    Sig: Take 1 tablet (3.125 mg total) by mouth 2 (two) times daily with a meal.    Authorizing Provider: ARIDA, MUHAMMAD A    Ordering User: Alannis Hsia N    

## 2017-04-17 NOTE — Telephone Encounter (Signed)
Requested Prescriptions   Signed Prescriptions Disp Refills  . potassium chloride (K-DUR) 10 MEQ tablet 90 tablet 0    Sig: Take 1 tablet (10 mEq total) by mouth daily.    Authorizing Provider: Lorine Bears A    Ordering User: Margrett Rud carvedilol (COREG) 3.125 MG tablet 180 tablet 0    Sig: Take 1 tablet (3.125 mg total) by mouth 2 (two) times daily with a meal.    Authorizing Provider: Lorine Bears A    Ordering User: Margrett Rud

## 2017-04-17 NOTE — Telephone Encounter (Signed)
Called to verify if Medication management has received refill request for Spironolactone 25MG .  When speaking to Kevil she stated she has not seen anything on this patient. She told me to fax over a paper copy of the prescription.   Called patient and made her aware that we are getting it sent over via fax.

## 2017-05-29 ENCOUNTER — Telehealth: Payer: Self-pay | Admitting: Pharmacy Technician

## 2017-05-29 ENCOUNTER — Ambulatory Visit: Payer: Self-pay | Admitting: Internal Medicine

## 2017-05-29 ENCOUNTER — Encounter: Payer: Self-pay | Admitting: Internal Medicine

## 2017-05-29 VITALS — BP 142/77 | HR 71 | Temp 97.8°F | Wt 245.6 lb

## 2017-05-29 DIAGNOSIS — I259 Chronic ischemic heart disease, unspecified: Secondary | ICD-10-CM

## 2017-05-29 NOTE — Telephone Encounter (Signed)
Provided 2018 tax return.  Patient eligible for medication assistance at [facility] through 2019, as long as eligibility criteria continues to be met.  [facility]

## 2017-05-29 NOTE — Progress Notes (Signed)
   Subjective:    Patient ID: Lindsey Gibson, female    DOB: Feb 11, 1960, 57 y.o.   MRN: [id number]  HPI  Pt is here for a 3 month f/u. Pt has no new complaints. She has a follow-up appointment scheduled for July 19th with cardiologist.   Allergies as of 05/29/2017      Reactions   Penicillins Rash   Has patient had a PCN reaction causing immediate rash, facial/tongue/throat swelling, SOB If all of the above answers are "NO", then may proceed with Cephalosporin use.  or lightheadedness with hypotension:unsure Has patient had a PCN reaction causing severe rash involving mucus membranes or skin necrosis:unsure Has patient had a PCN reaction that required hospitalization:unsure Has patient had a PCN reaction occurring within the last 10 years:unsure Childhood reaction      Medication List        Accurate as of 05/29/17  9:47 AM. Always use your most recent med list.          albuterol 108 (90 Base) MCG/ACT inhaler Commonly known as:  PROVENTIL HFA;VENTOLIN HFA Inhale 1 puff into the lungs every 6 (six) hours as needed for wheezing or shortness of breath.   aspirin 81 MG chewable tablet Chew 1 tablet (81 mg total) by mouth daily.   atorvastatin 80 MG tablet Commonly known as:  LIPITOR Take 1 tablet (80 mg total) by mouth daily.   carvedilol 3.125 MG tablet Commonly known as:  COREG Take 1 tablet (3.125 mg total) by mouth 2 (two) times daily with a meal.   furosemide 40 MG tablet Commonly known as:  LASIX Take 1 tablet (40 mg total) by mouth daily.   loratadine 10 MG tablet Commonly known as:  CLARITIN Take 1 tablet (10 mg total) by mouth daily.   losartan 25 MG tablet Commonly known as:  COZAAR Take 1 tablet (25 mg total) by mouth daily.   nitroGLYCERIN 0.4 MG SL tablet Commonly known as:  NITROSTAT Place 1 tablet (0.4 mg total) under the tongue every 5 (five) minutes as needed for chest pain.   potassium chloride 10 MEQ tablet Commonly known as:  K-DUR Take 1  tablet (10 mEq total) by mouth daily.   spironolactone 25 MG tablet Commonly known as:  ALDACTONE Take 1 tablet (25 mg total) by mouth daily.   ticagrelor 90 MG Tabs tablet Commonly known as:  BRILINTA Take 1 tablet (90 mg total) by mouth 2 (two) times daily.      Patient Active Problem List   Diagnosis Date Noted  . Pneumonia 12/27/2016  . NSTEMI (non-ST elevated myocardial infarction) (HCC)       Review of Systems     Objective:   Physical Exam  Constitutional: She is oriented to person, place, and time.  Cardiovascular: Normal rate, regular rhythm and normal heart sounds.  Pulmonary/Chest: Effort normal and breath sounds normal.  Neurological: She is alert and oriented to person, place, and time.    BP (!) 142/77 (BP Location: Left Arm, Patient Position: Sitting, Cuff Size: Normal)   Pulse 71   Temp 97.8 F (36.6 C) (Oral)   Wt 245 lb 9.6 oz (111.4 kg)   BMI 34.25 kg/m        Assessment & Plan:   Order labs for second week in July.  Rtc in 6 months with no labs.

## 2017-07-09 ENCOUNTER — Other Ambulatory Visit: Payer: Self-pay | Admitting: Cardiovascular Disease

## 2017-07-10 ENCOUNTER — Other Ambulatory Visit: Payer: Self-pay

## 2017-07-10 ENCOUNTER — Other Ambulatory Visit: Payer: Self-pay | Admitting: *Deleted

## 2017-07-10 DIAGNOSIS — I259 Chronic ischemic heart disease, unspecified: Secondary | ICD-10-CM

## 2017-07-10 MED ORDER — SPIRONOLACTONE 25 MG PO TABS: 25.0000 mg | ORAL_TABLET | Freq: Every day | ORAL | 0 refills | Status: DC

## 2017-07-11 LAB — TSH: TSH: 0.714 u[IU]/mL (ref 0.450–4.500)

## 2017-07-11 LAB — CBC
Hematocrit: 34 % (ref 34.0–46.6)
Hemoglobin: 11.3 g/dL (ref 11.1–15.9)
MCH: 26.6 pg (ref 26.6–33.0)
MCHC: 33.2 g/dL (ref 31.5–35.7)
MCV: 80 fL (ref 79–97)
PLATELETS: 219 10*3/uL (ref 150–450)
RBC: 4.25 x10E6/uL (ref 3.77–5.28)
RDW: 15.4 % (ref 12.3–15.4)
WBC: 5.4 10*3/uL (ref 3.4–10.8)

## 2017-07-11 LAB — COMPREHENSIVE METABOLIC PANEL
A/G RATIO: 1.5 (ref 1.2–2.2)
ALBUMIN: 4.1 g/dL (ref 3.5–5.5)
ALK PHOS: 85 IU/L (ref 39–117)
ALT: 15 IU/L (ref 0–32)
AST: 17 IU/L (ref 0–40)
BUN / CREAT RATIO: 16 (ref 9–23)
BUN: 15 mg/dL (ref 6–24)
Bilirubin Total: 1.1 mg/dL (ref 0.0–1.2)
CALCIUM: 9.5 mg/dL (ref 8.7–10.2)
CO2: 27 mmol/L (ref 20–29)
CREATININE: 0.91 mg/dL (ref 0.57–1.00)
Chloride: 106 mmol/L (ref 96–106)
GFR calc Af Amer: 80 mL/min/{1.73_m2} (ref >59)
GFR, EST NON AFRICAN AMERICAN: 70 mL/min/{1.73_m2} (ref >59)
GLOBULIN, TOTAL: 2.8 g/dL (ref 1.5–4.5)
Glucose: 102 mg/dL — ABNORMAL HIGH (ref 65–99)
POTASSIUM: 4.6 mmol/L (ref 3.5–5.2)
SODIUM: 147 mmol/L — AB (ref 134–144)
Total Protein: 6.9 g/dL (ref 6.0–8.5)

## 2017-07-11 LAB — URINALYSIS
Bilirubin, UA: NEGATIVE
Glucose, UA: NEGATIVE
KETONES UA: NEGATIVE
LEUKOCYTES UA: NEGATIVE
Nitrite, UA: NEGATIVE
PROTEIN UA: NEGATIVE
RBC UA: NEGATIVE
SPEC GRAV UA: 1.007 (ref 1.005–1.030)
Urobilinogen, Ur: 0.2 mg/dL (ref 0.2–1.0)
pH, UA: 7 (ref 5.0–7.5)

## 2017-07-11 LAB — LIPID PANEL
CHOLESTEROL TOTAL: 112 mg/dL (ref 100–199)
Chol/HDL Ratio: 2.7 ratio (ref 0.0–4.4)
HDL: 41 mg/dL (ref >39)
LDL Calculated: 52 mg/dL (ref 0–99)
TRIGLYCERIDES: 96 mg/dL (ref 0–149)
VLDL CHOLESTEROL CAL: 19 mg/dL (ref 5–40)

## 2017-07-19 ENCOUNTER — Encounter: Payer: Self-pay | Admitting: Cardiovascular Disease

## 2017-07-19 ENCOUNTER — Ambulatory Visit: Payer: Self-pay | Admitting: Cardiovascular Disease

## 2017-07-19 VITALS — BP 138/80 | HR 65 | Ht 71.0 in | Wt 241.2 lb

## 2017-07-19 DIAGNOSIS — I5022 Chronic systolic (congestive) heart failure: Secondary | ICD-10-CM

## 2017-07-19 DIAGNOSIS — I10 Essential (primary) hypertension: Secondary | ICD-10-CM

## 2017-07-19 DIAGNOSIS — E785 Hyperlipidemia, unspecified: Secondary | ICD-10-CM

## 2017-07-19 DIAGNOSIS — I2510 Atherosclerotic heart disease of native coronary artery without angina pectoris: Secondary | ICD-10-CM

## 2017-07-19 MED ORDER — LOSARTAN POTASSIUM 50 MG PO TABS: 50.0000 mg | ORAL_TABLET | Freq: Every day | ORAL | 3 refills | Status: DC

## 2017-07-19 NOTE — Patient Instructions (Signed)
Medication Instructions: INCREASE the Losartan to 50 mg daily  If you need a refill on your cardiac medications before your next appointment, please call your pharmacy.   Follow-Up: Your physician wants you to follow-up in 6 months with Dr. Kirke Corin. You will receive a reminder letter in the mail two months in advance. If you don't receive a letter, please call our office at [phone] to schedule this follow-up appointment.   Thank you for choosing [facility] at [facility]!

## 2017-07-19 NOTE — Progress Notes (Signed)
Cardiology Office Note   Date:  07/19/2017   ID:  JATAYA WANN, DOB 1960/05/23, MRN [id number]  PCP:  Kallie Locks, FNP  Cardiologist:   Lorine Bears, MD   Chief Complaint  Patient presents with  . OTHER    3 month f/u c/o chest discomfort and pt request permanent Handicap. Meds reviewed verbally with pt.      History of Present Illness: Lindsey Gibson is a 57 y.o. female who presents for a follow-up visit regarding coronary artery disease and chronic systolic heart failure.  Other medical problems include hyperlipidemia, obesity and COPD. She was hospitalized in December, 2018 with pulmonary edema and non-ST elevation myocardial infarction.  Echocardiogram showed an EF of 20-25%.  Cardiac catheterization showed severe mid LAD disease which was treated with PCI and drug-eluting stent placement.  She did not tolerate Entresto due to hypotension and recurrent chest pain.    She underwent a repeat echocardiogram in April of 2019 which showed improvement in EF to 50-55%.  There was evidence of grade 2 diastolic dysfunction. During last visit, I increased furosemide to 40 mg once daily due to volume overload.  She has been doing extremely well with significant improvement in shortness of breath.  She has rare episodes of chest pain described as aching sensation which is usually not exertional.  She takes her medications regularly.  Past Medical History:  Diagnosis Date  . Asthma   . CAD (coronary artery disease)    a. 12/2016 NSTEMI/PCI: LM nl, LAD 30ost, 61m (IVUS->severe plaque burden->3.0 x 18 Sierra DES), D1/2/3 nl, LCX 30p, OM1/2/3 nl, RCA nl, RPDA/RPAV/RPL1/2 nl.  . COPD (chronic obstructive pulmonary disease) (HCC)   . Hyperlipidemia   . Ischemic cardiomyopathy    a. 12/2016 Echo: EF 20-25%, mid-apicalanteroseptal, ant, and apical AK, GR1 DD, triv AI, mildly dil LA.  . Morbid obesity (HCC)     Past Surgical History:  Procedure Laterality Date  . CORONARY  STENT INTERVENTION N/A 12/27/2016   Procedure: CORONARY STENT INTERVENTION;  Surgeon: Iran Ouch, MD;  Location: [facility];  Service: Cardiovascular;  Laterality: N/A;  . INTRAVASCULAR ULTRASOUND/IVUS N/A 12/27/2016   Procedure: Intravascular Ultrasound/IVUS;  Surgeon: Iran Ouch, MD;  Location: [facility];  Service: Cardiovascular;  Laterality: N/A;  . KNEE ARTHROSCOPY Right   . LEFT HEART CATH AND CORONARY ANGIOGRAPHY N/A 12/27/2016   Procedure: LEFT HEART CATH AND CORONARY ANGIOGRAPHY;  Surgeon: Iran Ouch, MD;  Location: [facility];  Service: Cardiovascular;  Laterality: N/A;  . SHOULDER ARTHROSCOPY WITH SUBACROMIAL DECOMPRESSION Right 11/22/2015   Procedure: SHOULDER ARTHROSCOPY WITH DEBRIDEMENT, DECOMPRESSION  AND BICEPS TENODESIS;  Surgeon: Christena Flake, MD;  Location: [facility] ORS;  Service: Orthopedics;  Laterality: Right;  . TONSILLECTOMY    . ULNAR NERVE TRANSPOSITION Right 11/22/2015   Procedure: ULNAR NERVE DECOMPRESSION/TRANSPOSITION;  Surgeon: Christena Flake, MD;  Location: [facility] ORS;  Service: Orthopedics;  Laterality: Right;     Current Outpatient Medications  Medication Sig Dispense Refill  . albuterol (PROVENTIL HFA;VENTOLIN HFA) 108 (90 Base) MCG/ACT inhaler Inhale 1 puff into the lungs every 6 (six) hours as needed for wheezing or shortness of breath. 1 Inhaler 1  . aspirin 81 MG chewable tablet Chew 1 tablet (81 mg total) by mouth daily. 30 tablet 3  . atorvastatin (LIPITOR) 80 MG tablet Take 1 tablet (80 mg total) by mouth daily. 90 tablet 3  . carvedilol (COREG) 3.125 MG tablet TAKE  ONE TABLET BY MOUTH 2 TIMES A DAY WITH A MEAL. 180 tablet 2  . furosemide (LASIX) 40 MG tablet Take 1 tablet (40 mg total) by mouth daily. 90 tablet 3  . loratadine (CLARITIN) 10 MG tablet Take 1 tablet (10 mg total) by mouth daily. 90 tablet 2  . losartan (COZAAR) 25 MG tablet Take 1 tablet (25 mg total) by mouth daily. 90 tablet 3  .  nitroGLYCERIN (NITROSTAT) 0.4 MG SL tablet Place 1 tablet (0.4 mg total) under the tongue every 5 (five) minutes as needed for chest pain. 25 tablet 3  . potassium chloride (K-DUR) 10 MEQ tablet Take 1 tablet (10 mEq total) by mouth daily. 90 tablet 0  . spironolactone (ALDACTONE) 25 MG tablet Take 1 tablet (25 mg total) by mouth daily. 90 tablet 0  . ticagrelor (BRILINTA) 90 MG TABS tablet Take 1 tablet (90 mg total) by mouth 2 (two) times daily. 60 tablet 2   No current facility-administered medications for this visit.     Allergies:   Penicillins    Social History:  The patient  reports that she has quit smoking. Her smoking use included cigarettes. She has a 18.00 pack-year smoking history. She has never used smokeless tobacco. She reports that she does not drink alcohol or use drugs.   Family History:  The patient's family history includes Diabetes in her mother and sister; Heart disease in her mother.    ROS:  Please see the history of present illness.   Otherwise, review of systems are positive for none.   All other systems are reviewed and negative.    PHYSICAL EXAM: VS:  BP 138/80 (BP Location: Left Arm, Patient Position: Sitting, Cuff Size: Normal)   Pulse 65   Ht 5\' 11"  (1.803 m)   Wt 241 lb 4 oz (109.4 kg)   BMI 33.65 kg/m  , BMI Body mass index is 33.65 kg/m. GEN: Well nourished, well developed, in no acute distress  HEENT: normal  Neck: no JVD, carotid bruits, or masses Cardiac: RRR; no murmurs, rubs, or gallops, mild bilateral edema  Respiratory:  clear to auscultation bilaterally, normal work of breathing GI: soft, nontender, nondistended, + BS MS: no deformity or atrophy  Skin: warm and dry, no rash Neuro:  Strength and sensation are intact Psych: euthymic mood, full affect   EKG:  EKG is ordered today. EKG showed normal sinus rhythm with no significant ST or T wave changes.   Recent Labs: 12/27/2016: Magnesium 2.1 07/10/2017: ALT 15; BUN 15; Creatinine,  Ser 0.91; Hemoglobin 11.3; Platelets 219; Potassium 4.6; Sodium 147; TSH 0.714    Lipid Panel    Component Value Date/Time   CHOL 112 07/10/2017 1035   TRIG 96 07/10/2017 1035   HDL 41 07/10/2017 1035   CHOLHDL 2.7 07/10/2017 1035   CHOLHDL 4.7 12/28/2016 0642   VLDL 24 12/28/2016 0642   LDLCALC 52 07/10/2017 1035      Wt Readings from Last 3 Encounters:  07/19/17 241 lb 4 oz (109.4 kg)  05/29/17 245 lb 9.6 oz (111.4 kg)  04/11/17 253 lb (114.8 kg)       No flowsheet data found.    ASSESSMENT AND PLAN:  1.  Coronary artery disease involving native coronary arteries without angina: Continue dual antiplatelet therapy at least until December 2019.    2.  Chronic systolic heart failure: Ejection fraction improved to 50-55%.  She appears to be euvolemic on current dose of furosemide 40 mg once daily.  She is otherwise on optimal medical therapy for heart failure.  I did increase losartan to 50 mg once daily due to elevated blood pressure.  I reviewed her recent labs from July 10 which showed normal renal function and potassium.  Her sodium was slightly elevated at 147 but possibly due to a lab error given that her previous sodium was 133.  3.  Hyperlipidemia: Continue high-dose atorvastatin.  Most recent LDL was 52 which is at target.  4.  Essential hypertension: Blood pressure is mildly elevated.  Losartan was increased as outlined above.    Disposition:   FU with me in 6 months  Signed,  Lorine Bears, MD  07/19/2017 8:29 AM    [facility]

## 2017-08-23 ENCOUNTER — Telehealth: Payer: Self-pay | Admitting: Cardiovascular Disease

## 2017-08-23 NOTE — Telephone Encounter (Signed)
Document received and given to provider.

## 2017-08-23 NOTE — Telephone Encounter (Signed)
Patient dropped off handicapped placard form .  Placed in nurse box.

## 2017-08-27 ENCOUNTER — Other Ambulatory Visit: Payer: Self-pay

## 2017-08-27 MED ORDER — POTASSIUM CHLORIDE ER 10 MEQ PO TBCR: 10.0000 meq | EXTENDED_RELEASE_TABLET | Freq: Every day | ORAL | 3 refills | Status: DC

## 2017-08-28 NOTE — Telephone Encounter (Signed)
Pt is calling regarding pt handicap application, please call to advise of status.

## 2017-08-28 NOTE — Telephone Encounter (Signed)
Spoke with patient and reviewed that Dr. Kirke Corin reviewed application and that she does not qualify anymore from a cardiac standpoint given the improvement in her EF. Advised that I would leave this application up front and she could check with her primary care provider. She verbalized understanding with no further questions at this time.

## 2017-10-04 ENCOUNTER — Telehealth: Payer: Self-pay | Admitting: Pharmacist

## 2017-10-04 NOTE — Telephone Encounter (Signed)
10/04/2017 2:20:34 PM - Nitrostat refill-form to provider  10/04/17 I called [employer] for refill on Nitrostat 0.4mg , per their system patient has received the max fills, if additional refills needed we need to either fax a prescription or page 2 (prescription portion) of application. I have printed page 2 of application and will be taking to Dr. Mariah Milling @ [facility] to sign for additional refills. Forde Radon

## 2017-10-11 ENCOUNTER — Other Ambulatory Visit: Payer: Self-pay | Admitting: Cardiovascular Disease

## 2017-11-22 ENCOUNTER — Telehealth: Payer: Self-pay | Admitting: Pharmacist

## 2017-11-22 NOTE — Telephone Encounter (Signed)
11/22/2017 3:55:25 PM - Ventolin/[employer] renewal  11/22/17 Patient enrollment with [employer] ends 01/31/18, time for refill on Ventolin-Printed renewal application-mailing patient her portion to sign & return, also printed script and will take to Dr. Mariah Milling to sign.Forde Radon

## 2017-11-26 ENCOUNTER — Other Ambulatory Visit: Payer: Self-pay | Admitting: Internal Medicine

## 2017-12-04 ENCOUNTER — Encounter: Payer: Self-pay | Admitting: Internal Medicine

## 2017-12-04 ENCOUNTER — Telehealth: Payer: Self-pay | Admitting: Cardiovascular Disease

## 2017-12-04 ENCOUNTER — Other Ambulatory Visit: Payer: Self-pay

## 2017-12-04 ENCOUNTER — Ambulatory Visit: Payer: Self-pay | Admitting: Internal Medicine

## 2017-12-04 VITALS — BP 131/76 | HR 67 | Temp 98.1°F | Ht 70.0 in | Wt 243.4 lb

## 2017-12-04 DIAGNOSIS — E785 Hyperlipidemia, unspecified: Secondary | ICD-10-CM

## 2017-12-04 DIAGNOSIS — Z0000 Encounter for general adult medical examination without abnormal findings: Secondary | ICD-10-CM

## 2017-12-04 NOTE — Progress Notes (Signed)
Subjective:    Patient ID: Lindsey Gibson, female    DOB: [birth year], 57 y.o.   MRN: [id number]  HPI   Pt is a 57 year old women who presents for a follow up at [facility]. Pt does not need any medication refills.She has follow up with cardiologist in January.   Chief Complaint  Patient presents with  . Follow-up   Review of Systems  Patient Active Problem List   Diagnosis Date Noted  . Pneumonia 12/27/2016  . NSTEMI (non-ST elevated myocardial infarction) (HCC)    Allergies as of 12/04/2017      Reactions   Penicillins Rash   Has patient had a PCN reaction causing immediate rash, facial/tongue/throat swelling, SOB If all of the above answers are "NO", then may proceed with Cephalosporin use.  or lightheadedness with hypotension:unsure Has patient had a PCN reaction causing severe rash involving mucus membranes or skin necrosis:unsure Has patient had a PCN reaction that required hospitalization:unsure Has patient had a PCN reaction occurring within the last 10 years:unsure Childhood reaction      Medication List        Accurate as of 12/04/17  9:23 AM. Always use your most recent med list.          albuterol 108 (90 Base) MCG/ACT inhaler Commonly known as:  PROVENTIL HFA;VENTOLIN HFA Inhale 1 puff into the lungs every 6 (six) hours as needed for wheezing or shortness of breath.   aspirin 81 MG chewable tablet Chew 1 tablet (81 mg total) by mouth daily.   atorvastatin 80 MG tablet Commonly known as:  LIPITOR Take 1 tablet (80 mg total) by mouth daily.   carvedilol 3.125 MG tablet Commonly known as:  COREG TAKE ONE TABLET BY MOUTH 2 TIMES A DAY WITH A MEAL.   cetirizine 10 MG tablet Commonly known as:  ZYRTEC TAKE ONE TABLET BY MOUTH EVERY DAY. REPLACES LORATADINE.   furosemide 40 MG tablet Commonly known as:  LASIX Take 1 tablet (40 mg total) by mouth daily.   losartan 50 MG tablet Commonly known as:  COZAAR Take 1 tablet (50 mg total) by mouth  daily.   nitroGLYCERIN 0.4 MG SL tablet Commonly known as:  NITROSTAT Place 1 tablet (0.4 mg total) under the tongue every 5 (five) minutes as needed for chest pain.   potassium chloride 10 MEQ tablet Commonly known as:  K-DUR Take 1 tablet (10 mEq total) by mouth daily.   spironolactone 25 MG tablet Commonly known as:  ALDACTONE TAKE ONE TABLET BY MOUTH EVERY DAY   ticagrelor 90 MG Tabs tablet Commonly known as:  BRILINTA Take 1 tablet (90 mg total) by mouth 2 (two) times daily.         Objective:   Physical Exam  Constitutional: She is oriented to person, place, and time.  Cardiovascular: Normal rate, regular rhythm and normal heart sounds.  Pulmonary/Chest: Effort normal and breath sounds normal.  Neurological: She is alert and oriented to person, place, and time.    BP 131/76   Pulse 67   Temp 98.1 F (36.7 C) (Oral)   Ht 5\' 10"  (1.778 m)   Wt 243 lb 6.4 oz (110.4 kg)   BMI 34.92 kg/m        Assessment & Plan:   1. Hyperlipidemia, unspecified hyperlipidemia type Controlled based on labs in 07/2017  Check Today: - Comprehensive metabolic panel - CBC - Lipid panel - Urinalysis  Check in 6 Months:  - Comprehensive metabolic  panel; Future - CBC; Future - Lipid panel; Future - TSH; Future  2. Healthcare maintenance Pt does not express any new health concerns and is following up with her cardiologist regularly. Pt will follow up with [facility] in 6 months with labs a week prior.

## 2017-12-04 NOTE — Telephone Encounter (Signed)
Please review message for refill on Brilinta.

## 2017-12-04 NOTE — Telephone Encounter (Signed)
Spoke with patient and she thinks that Dr. Kirke Corin mentioned she may be able to stop her Brilinta soon. She only has 2 weeks left of her medication and wanted to know if she should get refill or not. Advised that I would check with her provider and then be in touch with his recommendations. She verbalized understanding with no further questions at this time.

## 2017-12-04 NOTE — Telephone Encounter (Signed)
°*  STAT* If patient is at the pharmacy, call can be transferred to refill team.   1. Which medications need to be refilled? (please list name of each medication and dose if known)  Brilinta 90 MG - 1 tablet 2 times daily   2. Which pharmacy/location (including street and city if local pharmacy) is medication to be sent to? [facility]  3. Do they need a 30 day or 90 day supply? 30 day   Patient states that she has two weeks left of medicine.  Knows that Dr. Kirke Corin mentioned coming off medication, not sure if refill is necessary.  Patient has appt scheduled for 1/16.  Please advise.

## 2017-12-05 LAB — URINALYSIS
Bilirubin, UA: NEGATIVE
Glucose, UA: NEGATIVE
Ketones, UA: NEGATIVE
Nitrite, UA: NEGATIVE
Protein, UA: NEGATIVE
RBC, UA: NEGATIVE
Specific Gravity, UA: 1.007 (ref 1.005–1.030)
Urobilinogen, Ur: 0.2 mg/dL (ref 0.2–1.0)
pH, UA: 5 (ref 5.0–7.5)

## 2017-12-05 LAB — COMPREHENSIVE METABOLIC PANEL
A/G RATIO: 1.7 (ref 1.2–2.2)
ALT: 16 IU/L (ref 0–32)
AST: 16 IU/L (ref 0–40)
Albumin: 4.7 g/dL (ref 3.5–5.5)
Alkaline Phosphatase: 91 IU/L (ref 39–117)
BUN/Creatinine Ratio: 17 (ref 9–23)
BUN: 16 mg/dL (ref 6–24)
Bilirubin Total: 1.2 mg/dL (ref 0.0–1.2)
CALCIUM: 10.1 mg/dL (ref 8.7–10.2)
CO2: 24 mmol/L (ref 20–29)
Chloride: 104 mmol/L (ref 96–106)
Creatinine, Ser: 0.94 mg/dL (ref 0.57–1.00)
GFR, EST AFRICAN AMERICAN: 77 mL/min/{1.73_m2} (ref >59)
GFR, EST NON AFRICAN AMERICAN: 67 mL/min/{1.73_m2} (ref >59)
GLOBULIN, TOTAL: 2.7 g/dL (ref 1.5–4.5)
Glucose: 93 mg/dL (ref 65–99)
POTASSIUM: 4.7 mmol/L (ref 3.5–5.2)
SODIUM: 144 mmol/L (ref 134–144)
TOTAL PROTEIN: 7.4 g/dL (ref 6.0–8.5)

## 2017-12-05 LAB — LIPID PANEL
CHOLESTEROL TOTAL: 134 mg/dL (ref 100–199)
Chol/HDL Ratio: 2.7 ratio (ref 0.0–4.4)
HDL: 49 mg/dL (ref >39)
LDL CALC: 59 mg/dL (ref 0–99)
Triglycerides: 128 mg/dL (ref 0–149)
VLDL CHOLESTEROL CAL: 26 mg/dL (ref 5–40)

## 2017-12-05 LAB — CBC
HEMOGLOBIN: 11.6 g/dL (ref 11.1–15.9)
Hematocrit: 34.4 % (ref 34.0–46.6)
MCH: 28.1 pg (ref 26.6–33.0)
MCHC: 33.7 g/dL (ref 31.5–35.7)
MCV: 83 fL (ref 79–97)
Platelets: 236 10*3/uL (ref 150–450)
RBC: 4.13 x10E6/uL (ref 3.77–5.28)
RDW: 13.8 % (ref 12.3–15.4)
WBC: 6.2 10*3/uL (ref 3.4–10.8)

## 2017-12-09 NOTE — Telephone Encounter (Signed)
She can stop Brilinta after finishing current supplies.

## 2017-12-09 NOTE — Telephone Encounter (Signed)
The patient has been made aware and the medication has been taken off of her list.

## 2018-01-16 ENCOUNTER — Ambulatory Visit: Payer: Self-pay | Admitting: Cardiovascular Disease

## 2018-01-17 ENCOUNTER — Ambulatory Visit (INDEPENDENT_AMBULATORY_CARE_PROVIDER_SITE_OTHER): Payer: Self-pay | Admitting: Nurse Practitioner

## 2018-01-17 ENCOUNTER — Encounter: Payer: Self-pay | Admitting: Nurse Practitioner

## 2018-01-17 VITALS — BP 130/66 | HR 64 | Ht 71.0 in | Wt 245.0 lb

## 2018-01-17 DIAGNOSIS — I2510 Atherosclerotic heart disease of native coronary artery without angina pectoris: Secondary | ICD-10-CM

## 2018-01-17 DIAGNOSIS — I255 Ischemic cardiomyopathy: Secondary | ICD-10-CM

## 2018-01-17 DIAGNOSIS — E785 Hyperlipidemia, unspecified: Secondary | ICD-10-CM

## 2018-01-17 DIAGNOSIS — I10 Essential (primary) hypertension: Secondary | ICD-10-CM

## 2018-01-17 MED ORDER — SPIRONOLACTONE 25 MG PO TABS: 25.0000 mg | ORAL_TABLET | Freq: Every day | ORAL | 1 refills | Status: DC

## 2018-01-17 MED ORDER — CARVEDILOL 3.125 MG PO TABS: ORAL_TABLET | ORAL | 1 refills | Status: DC

## 2018-01-17 MED ORDER — POTASSIUM CHLORIDE ER 10 MEQ PO TBCR: 10.0000 meq | EXTENDED_RELEASE_TABLET | Freq: Every day | ORAL | 1 refills | Status: DC

## 2018-01-17 MED ORDER — FUROSEMIDE 40 MG PO TABS: 40.0000 mg | ORAL_TABLET | Freq: Every day | ORAL | 1 refills | Status: DC

## 2018-01-17 MED ORDER — LOSARTAN POTASSIUM 50 MG PO TABS: 50.0000 mg | ORAL_TABLET | Freq: Every day | ORAL | 1 refills | Status: DC

## 2018-01-17 MED ORDER — ATORVASTATIN CALCIUM 80 MG PO TABS: 80.0000 mg | ORAL_TABLET | Freq: Every day | ORAL | 1 refills | Status: DC

## 2018-01-17 NOTE — Patient Instructions (Signed)

## 2018-01-17 NOTE — Progress Notes (Signed)
Office Visit    Patient Name: Lindsey Gibson Date of Encounter: 01/17/2018  Primary Care Provider:  System, Provider Not In Primary Cardiologist:  Lorine Bears, MD  Chief Complaint    58 year old female with a history of CAD status post non-STEMI December 2018 with stenting of the LAD, ischemic cardiomyopathy, HFrEF with recovery of LV function, hyperlipidemia, obesity, and COPD, who presents for follow-up of the above.  Past Medical History    Past Medical History:  Diagnosis Date  . Asthma   . CAD (coronary artery disease)    a. 12/2016 NSTEMI/PCI: LM nl, LAD 30ost, 11m (IVUS->severe plaque burden->3.0 x 18 Sierra DES), D1/2/3 nl, LCX 30p, OM1/2/3 nl, RCA nl, RPDA/RPAV/RPL1/2 nl.  . COPD (chronic obstructive pulmonary disease) (HCC)   . Hyperlipidemia   . Ischemic cardiomyopathy    a. 12/2016 Echo: EF 20-25%, mid-apicalanteroseptal, ant, and apical AK, GR1 DD, triv AI, mildly dil LA; b. 04/2017 Echo: EF 50-55%, Gr2 DD, Nl RV fxn.  . Morbid obesity (HCC)    Past Surgical History:  Procedure Laterality Date  . CORONARY STENT INTERVENTION N/A 12/27/2016   Procedure: CORONARY STENT INTERVENTION;  Surgeon: Iran Ouch, MD;  Location: [facility] INVASIVE CV LAB;  Service: Cardiovascular;  Laterality: N/A;  . INTRAVASCULAR ULTRASOUND/IVUS N/A 12/27/2016   Procedure: Intravascular Ultrasound/IVUS;  Surgeon: Iran Ouch, MD;  Location: [facility] CV [facility];  Service: Cardiovascular;  Laterality: N/A;  . KNEE ARTHROSCOPY Right   . LEFT HEART CATH AND CORONARY ANGIOGRAPHY N/A 12/27/2016   Procedure: LEFT HEART CATH AND CORONARY ANGIOGRAPHY;  Surgeon: Iran Ouch, MD;  Location: [facility];  Service: Cardiovascular;  Laterality: N/A;  . SHOULDER ARTHROSCOPY WITH SUBACROMIAL DECOMPRESSION Right 11/22/2015   Procedure: SHOULDER ARTHROSCOPY WITH DEBRIDEMENT, DECOMPRESSION  AND BICEPS TENODESIS;  Surgeon: Christena Flake, MD;  Location: [facility] ORS;  Service:  Orthopedics;  Laterality: Right;  . TONSILLECTOMY    . ULNAR NERVE TRANSPOSITION Right 11/22/2015   Procedure: ULNAR NERVE DECOMPRESSION/TRANSPOSITION;  Surgeon: Christena Flake, MD;  Location: [facility] ORS;  Service: Orthopedics;  Laterality: Right;    Allergies  Allergies  Allergen Reactions  . Penicillins Rash    Has patient had a PCN reaction causing immediate rash, facial/tongue/throat swelling, SOB If all of the above answers are "NO", then may proceed with Cephalosporin use.   or lightheadedness with hypotension:unsure Has patient had a PCN reaction causing severe rash involving mucus membranes or skin necrosis:unsure Has patient had a PCN reaction that required hospitalization:unsure Has patient had a PCN reaction occurring within the last 10 years:unsure  Childhood reaction    History of Present Illness    58 year old female with the above past medical history including remote tobacco abuse, asthma, and COPD/chronic bronchitis.  She was hospitalized in late December 2018 with acute pulmonary edema and respiratory failure with finding of non-STEMI and troponin rise to 15.33.  Echocardiogram showed LV dysfunction with an EF of 2025%.  Diagnostic catheterization was performed and showed severe mid LAD disease which required drug-eluting stent placement.  Subsequent echocardiogram in April 2019 showed improvement of LV function to 50-55%.  Grade 2 diastolic dysfunction was also noted on echo and Lasix dose was increased to 40 mg daily at follow-up visit.    She was last seen in clinic in July 2019 at which time she was hypertensive and losartan dose was increased.  Since then, her BPs @ home have been trending in the 1-teens.  She walks most days  of the week, [facility] mins @ a time.  She denies chest pain, palpitations, dyspnea, pnd, orthopnea, n, v, dizziness, syncope, edema, weight gain, or early satiety. She is tolerating her medications well and had labs in early  Dec, which showed stable renal fxn/lytes, and lipids @ goal.  At Dr. Jari SportsmanArida's recommendation, she came off of brilinta in Dec.  Home Medications    Prior to Admission medications   Medication Sig Start Date End Date Taking? Authorizing Provider  albuterol (PROVENTIL HFA;VENTOLIN HFA) 108 (90 Base) MCG/ACT inhaler Inhale 1 puff into the lungs every 6 (six) hours as needed for wheezing or shortness of breath. 12/29/16  Yes Enedina FinnerPatel, Sona, MD  aspirin 81 MG chewable tablet Chew 1 tablet (81 mg total) by mouth daily. 12/29/16  Yes Enedina FinnerPatel, Sona, MD  carvedilol (COREG) 3.125 MG tablet TAKE ONE TABLET BY MOUTH 2 TIMES A DAY WITH A MEAL. 07/10/17  Yes Iran OuchArida, Muhammad A, MD  cetirizine (ZYRTEC) 10 MG tablet TAKE ONE TABLET BY MOUTH EVERY DAY. REPLACES LORATADINE. 11/26/17  Yes Chaplin, Jimmie Mollyon C, MD  potassium chloride (K-DUR) 10 MEQ tablet Take 1 tablet (10 mEq total) by mouth daily. 08/27/17  Yes Iran OuchArida, Muhammad A, MD  spironolactone (ALDACTONE) 25 MG tablet TAKE ONE TABLET BY MOUTH EVERY DAY 10/11/17  Yes Gollan, Tollie Pizzaimothy J, MD  atorvastatin (LIPITOR) 80 MG tablet Take 1 tablet (80 mg total) by mouth daily. 02/27/17 07/19/17  Creig HinesBerge, Dottie Vaquerano Ronald, NP  furosemide (LASIX) 40 MG tablet Take 1 tablet (40 mg total) by mouth daily. 04/11/17 07/19/17  Iran OuchArida, Muhammad A, MD  losartan (COZAAR) 50 MG tablet Take 1 tablet (50 mg total) by mouth daily. 07/19/17 10/17/17  Iran OuchArida, Muhammad A, MD  nitroGLYCERIN (NITROSTAT) 0.4 MG SL tablet Place 1 tablet (0.4 mg total) under the tongue every 5 (five) minutes as needed for chest pain. 01/15/17 07/19/17  Creig HinesBerge, Adabelle Griffiths Ronald, NP    Review of Systems    She denies chest pain, palpitations, dyspnea, pnd, orthopnea, n, v, dizziness, syncope, edema, weight gain, or early satiety.  All other systems reviewed and are otherwise negative except as noted above.  Physical Exam    VS:  BP 130/66 (BP Location: Left Arm, Patient Position: Sitting, Cuff Size: Normal)   Pulse 64   Ht 5'  11" (1.803 m)   Wt 245 lb (111.1 kg)   BMI 34.17 kg/m  , BMI Body mass index is 34.17 kg/m. GEN: Well nourished, well developed, in no acute distress. HEENT: normal. Neck: Supple, no JVD, carotid bruits, or masses. Cardiac: RRR, no murmurs, rubs, or gallops. No clubbing, cyanosis, edema.  Radials/PT 2+ and equal bilaterally.  Respiratory:  Respirations regular and unlabored, clear to auscultation bilaterally. GI: Soft, nontender, nondistended, BS + x 4. MS: no deformity or atrophy. Skin: warm and dry, no rash. Neuro:  Strength and sensation are intact. Psych: Normal affect.  Accessory Clinical Findings    ECG personally reviewed by me today - RSR, 64,  - no acute changes.  Lab Results  Component Value Date   CREATININE 0.94 12/04/2017   BUN 16 12/04/2017   NA 144 12/04/2017   K 4.7 12/04/2017   CL 104 12/04/2017   CO2 24 12/04/2017   Lab Results  Component Value Date   WBC 6.2 12/04/2017   HGB 11.6 12/04/2017   HCT 34.4 12/04/2017   MCV 83 12/04/2017   PLT 236 12/04/2017   Lab Results  Component Value Date   CHOL  134 12/04/2017   HDL 49 12/04/2017   LDLCALC 59 12/04/2017   TRIG 128 12/04/2017   CHOLHDL 2.7 12/04/2017    Lab Results  Component Value Date   ALT 16 12/04/2017   AST 16 12/04/2017   ALKPHOS 91 12/04/2017   BILITOT 1.2 12/04/2017     Assessment & Plan    1.  CAD: s/p NSTEMI and PCI/DES of the LAD in 12/2016.  She has been doing well since her last visit in July without chest pain or dyspnea.  She is walking regularly without limitations.  Brilinta was discontinued following 12 months of therapy in December.  She remains on aspirin, statin, beta-blocker, and ARB.  2.  Ischemic cardiomyopathy: EF previously 20 to 25% in December 2018 with subsequent recovery to 50-55% in April 2019.  She has good exercise tolerance and is euvolemic on examination today.  Heart rate and blood pressure stable.  She remains on beta-blocker, ARB, Spironolactone, and  Lasix.  Recent lab work showed stable renal function and electrolytes.  3.  Hyperlipidemia: She remains on high potency statin therapy.  LDL was 59 in December with normal LFTs.  4.  Essential hypertension: Blood pressure 130/66 today though she notes that she typically runs in the 1 teens at home.  She will continue to trend blood pressures at home.  Continue beta-blocker, ARB, Lasix, and Spironolactone.  5.  Disposition: Patient is doing well.  Follow-up in clinic in 6 months or sooner if necessary.   Nicolasa Ducking, NP 01/17/2018, 1:37 PM

## 2018-01-23 ENCOUNTER — Encounter: Payer: Self-pay | Admitting: Pharmacist

## 2018-01-29 ENCOUNTER — Encounter: Payer: Self-pay | Admitting: Pharmacist

## 2018-01-29 ENCOUNTER — Other Ambulatory Visit: Payer: Self-pay

## 2018-01-29 ENCOUNTER — Ambulatory Visit: Payer: Self-pay | Admitting: Pharmacist

## 2018-01-29 VITALS — BP 120/68 | Wt 240.0 lb

## 2018-01-29 DIAGNOSIS — Z79899 Other long term (current) drug therapy: Secondary | ICD-10-CM

## 2018-01-29 NOTE — Progress Notes (Signed)
[facility] Visit Note  Patient: Lindsey Gibson MRN: [id number] Date of Birth: 12/04/1960 PCP: System, Provider Not In   [name] 58 y.o. female presents for a medication management visit today.  BP 120/68 (BP Location: Right Arm, Patient Position: Sitting, Cuff Size: Large)   Wt 240 lb (108.9 kg)   BMI 33.47 kg/m   Patient Information   Past Medical History:  Diagnosis Date  . Asthma   . CAD (coronary artery disease)    a. 12/2016 NSTEMI/PCI: LM nl, LAD 30ost, 53m (IVUS->severe plaque burden->3.0 x 18 Sierra DES), D1/2/3 nl, LCX 30p, OM1/2/3 nl, RCA nl, RPDA/RPAV/RPL1/2 nl.  . COPD (chronic obstructive pulmonary disease) (HCC)   . Hyperlipidemia   . Hypertension   . Ischemic cardiomyopathy    a. 12/2016 Echo: EF 20-25%, mid-apicalanteroseptal, ant, and apical AK, GR1 DD, triv AI, mildly dil LA; b. 04/2017 Echo: EF 50-55%, Gr2 DD, Nl RV fxn.  . Morbid obesity (HCC)   . Myocardial infarction [facility])       Past Surgical History:  Procedure Laterality Date  . CORONARY STENT INTERVENTION N/A 12/27/2016   Procedure: CORONARY STENT INTERVENTION;  Surgeon: Iran Ouch, MD;  Location: [facility];  Service: Cardiovascular;  Laterality: N/A;  . INTRAVASCULAR ULTRASOUND/IVUS N/A 12/27/2016   Procedure: Intravascular Ultrasound/IVUS;  Surgeon: Iran Ouch, MD;  Location: [facility];  Service: Cardiovascular;  Laterality: N/A;  . KNEE ARTHROSCOPY Right   . LEFT HEART CATH AND CORONARY ANGIOGRAPHY N/A 12/27/2016   Procedure: LEFT HEART CATH AND CORONARY ANGIOGRAPHY;  Surgeon: Iran Ouch, MD;  Location: [facility];  Service: Cardiovascular;  Laterality: N/A;  . SHOULDER ARTHROSCOPY WITH SUBACROMIAL DECOMPRESSION Right 11/22/2015   Procedure: SHOULDER ARTHROSCOPY WITH DEBRIDEMENT, DECOMPRESSION  AND BICEPS TENODESIS;  Surgeon: Christena Flake, MD;  Location: [facility] OR[facility];  Service: Orthopedics;  Laterality: Right;  .  TONSILLECTOMY    . ULNAR NERVE TRANSPOSITION Right 11/22/2015   Procedure: ULNAR NERVE DECOMPRESSION/TRANSPOSITION;  Surgeon: Christena Flake, MD;  Location: [facility] OR[facility];  Service: Orthopedics;  Laterality: Right;     Family History  Problem Relation Age of Onset  . Heart disease Mother   . Diabetes Mother   . Diabetes Sister     Family Support: Good  Lifestyle Diet: Breakfast: skips Lunch: pizza, hot dog Dinner: cooking in instapot Drinks: diet soda, water    Current Exercise Habits: Home exercise routine, Time (Minutes): 60, Frequency (Times/Week): 7, Weekly Exercise (Minutes/Week): 420, Intensity: Moderate       Social History   Substance and Sexual Activity  Alcohol Use No      Social History   Tobacco Use  Smoking Status Former Smoker  . Packs/day: 1.00  . Years: 18.00  . Pack years: 18.00  . Types: Cigarettes  Smokeless Tobacco Never Used      Health Maintenance  Topic Date Due  . Hepatitis C Screening  [birth year]  . TETANUS/TDAP  05/10/1978  . PAP SMEAR-Modifier  05/09/1980  . MAMMOGRAM  05/09/2009  . COLONOSCOPY  05/09/2009  . INFLUENZA VACCINE  08/01/2017  . HIV Screening  Completed   Outpatient Encounter Medications as of 01/29/2018  Medication Sig  . albuterol (PROVENTIL HFA;VENTOLIN HFA) 108 (90 Base) MCG/ACT inhaler Inhale 1 puff into the lungs every 6 (six) hours as needed for wheezing or shortness of breath.  Marland Kitchen aspirin 81 MG chewable tablet Chew 1 tablet (81 mg total) by mouth daily.  Marland Kitchen atorvastatin (LIPITOR)  80 MG tablet Take 1 tablet (80 mg total) by mouth daily.  . carvedilol (COREG) 3.125 MG tablet TAKE ONE TABLET BY MOUTH 2 TIMES A DAY WITH A MEAL.  . cetirizine (ZYRTEC) 10 MG tablet TAKE ONE TABLET BY MOUTH EVERY DAY. REPLACES LORATADINE.  . furosemide (LASIX) 40 MG tablet Take 1 tablet (40 mg total) by mouth daily.  Marland Kitchen losartan (COZAAR) 50 MG tablet Take 1 tablet (50 mg total) by mouth daily.  . Multiple Vitamin (MULTIVITAMIN WITH  MINERALS) TABS tablet Take 1 tablet by mouth daily.  . potassium chloride (K-DUR) 10 MEQ tablet Take 1 tablet (10 mEq total) by mouth daily.  Marland Kitchen spironolactone (ALDACTONE) 25 MG tablet Take 1 tablet (25 mg total) by mouth daily.  . nitroGLYCERIN (NITROSTAT) 0.4 MG SL tablet Place 1 tablet (0.4 mg total) under the tongue every 5 (five) minutes as needed for chest pain.   No facility-administered encounter medications on file as of 01/29/2018.    Health Maintenance/Date Completed  Last ED visit: December 2018 Last Visit to PCP: December 2019 Next Visit to PCP: June Specialist Visit: Cardiology Dental Exam: no Eye Exam: long time Prostate Exam: NA Pelvic/PAP Exam: 4 years Mammogram: 4 years DEXA: NA Colonoscopy: no Flu Vaccine: no Pneumonia Vaccine: yes    Assessment and Plan: NSTEMI: aspirin, atorvastatin, carvedilol, losartan. PCI with DES 12/2016. Follows with [facility]. Last saw them in December with no issues.  HF: ischemic cardiomyopathy per Cardio. EF 20-25% 12/2016 following MI, but recovered to 50-55% 04/2017. Carvedilol, losartan, spironolactone, furosemide. No issues with medications.  HTN: meds as above. Controlled in office today. Checks at home and runs around 120s, may increase slightly at MD appointments. Reports good diet and exercise.  HLD: atorvastatin. LDL 59 12/2017. Controlled. Diet and exercise as above. No issues.  COPD: albuterol PRN. Uses maybe once every 2-3 months. No issues otherwise.  Compliance: never misses doses. Picked up refills today. Husband and daughter with her today. Good support.  I will have her follow up in 1 year for MTM.  Pricilla Riffle, PharmD Pharmacy Resident

## 2018-02-07 ENCOUNTER — Other Ambulatory Visit: Payer: Self-pay | Admitting: Cardiovascular Disease

## 2018-02-12 ENCOUNTER — Telehealth: Payer: Self-pay | Admitting: Pharmacy Technician

## 2018-02-12 NOTE — Telephone Encounter (Signed)
Received updated 2020 proof of income.  Patient eligible to receive medication assistance at [facility] as long as eligibility requirements continue to be met.  Nhia Heaphy J. Mailey Landstrom Care Manager [facility] 

## 2018-05-28 ENCOUNTER — Other Ambulatory Visit: Payer: Self-pay

## 2018-05-28 DIAGNOSIS — E785 Hyperlipidemia, unspecified: Secondary | ICD-10-CM

## 2018-05-29 LAB — COMPREHENSIVE METABOLIC PANEL
ALT: 16 IU/L (ref 0–32)
AST: 15 IU/L (ref 0–40)
Albumin/Globulin Ratio: 1.8 (ref 1.2–2.2)
Albumin: 4.2 g/dL (ref 3.8–4.9)
Alkaline Phosphatase: 82 IU/L (ref 39–117)
BUN/Creatinine Ratio: 16 (ref 9–23)
BUN: 13 mg/dL (ref 6–24)
Bilirubin Total: 1.3 mg/dL — ABNORMAL HIGH (ref 0.0–1.2)
CO2: 22 mmol/L (ref 20–29)
Calcium: 9.3 mg/dL (ref 8.7–10.2)
Chloride: 102 mmol/L (ref 96–106)
Creatinine, Ser: 0.82 mg/dL (ref 0.57–1.00)
GFR calc Af Amer: 91 mL/min/{1.73_m2} (ref >59)
GFR calc non Af Amer: 79 mL/min/{1.73_m2} (ref >59)
Globulin, Total: 2.4 g/dL (ref 1.5–4.5)
Glucose: 98 mg/dL (ref 65–99)
Potassium: 4.2 mmol/L (ref 3.5–5.2)
Sodium: 140 mmol/L (ref 134–144)
Total Protein: 6.6 g/dL (ref 6.0–8.5)

## 2018-05-29 LAB — LIPID PANEL
Chol/HDL Ratio: 2.5 ratio (ref 0.0–4.4)
Cholesterol, Total: 119 mg/dL (ref 100–199)
HDL: 47 mg/dL (ref >39)
LDL Calculated: 51 mg/dL (ref 0–99)
Triglycerides: 107 mg/dL (ref 0–149)
VLDL Cholesterol Cal: 21 mg/dL (ref 5–40)

## 2018-05-29 LAB — CBC
Hematocrit: 35.8 % (ref 34.0–46.6)
Hemoglobin: 11.8 g/dL (ref 11.1–15.9)
MCH: 26.8 pg (ref 26.6–33.0)
MCHC: 33 g/dL (ref 31.5–35.7)
MCV: 81 fL (ref 79–97)
Platelets: 224 10*3/uL (ref 150–450)
RBC: 4.4 x10E6/uL (ref 3.77–5.28)
RDW: 14 % (ref 11.7–15.4)
WBC: 5.7 10*3/uL (ref 3.4–10.8)

## 2018-05-29 LAB — TSH: TSH: 0.668 u[IU]/mL (ref 0.450–4.500)

## 2018-06-04 ENCOUNTER — Ambulatory Visit: Payer: Self-pay | Admitting: Internal Medicine

## 2018-06-04 ENCOUNTER — Other Ambulatory Visit: Payer: Self-pay

## 2018-06-04 DIAGNOSIS — T7840XA Allergy, unspecified, initial encounter: Secondary | ICD-10-CM

## 2018-06-04 MED ORDER — ALBUTEROL SULFATE HFA 108 (90 BASE) MCG/ACT IN AERS: 1.0000 | INHALATION_SPRAY | Freq: Four times a day (QID) | RESPIRATORY_TRACT | 3 refills | Status: DC | PRN

## 2018-06-04 MED ORDER — CETIRIZINE HCL 10 MG PO TABS: ORAL_TABLET | ORAL | 3 refills | Status: DC

## 2018-06-04 NOTE — Progress Notes (Signed)
   Subjective:    Patient ID: Lindsey Gibson, female    DOB: June 21, 1960, 58 y.o.   MRN: [id number] HPI   Pt states she is doing well. No new problems. Pt goes to cardiologist in July. Labs look good. Pt needs inhaler and allergy med refill.  Patient Active Problem List   Diagnosis Date Noted  . Pneumonia 12/27/2016  . NSTEMI (non-ST elevated myocardial infarction) ([place])     Allergies as of 06/04/2018      Reactions   Penicillins Rash   Has patient had a PCN reaction causing immediate rash, facial/tongue/throat swelling, SOB If all of the above answers are "NO", then may proceed with Cephalosporin use.  or lightheadedness with hypotension:unsure Has patient had a PCN reaction causing severe rash involving mucus membranes or skin necrosis:unsure Has patient had a PCN reaction that required hospitalization:unsure Has patient had a PCN reaction occurring within the last 10 years:unsure Childhood reaction      Medication List       Accurate as of June 04, 2018  9:24 AM. If you have any questions, ask your nurse or doctor.        albuterol 108 (90 Base) MCG/ACT inhaler Commonly known as:  VENTOLIN HFA Inhale 1 puff into the lungs every 6 (six) hours as needed for wheezing or shortness of breath.   aspirin 81 MG chewable tablet Chew 1 tablet (81 mg total) by mouth daily.   atorvastatin 80 MG tablet Commonly known as:  LIPITOR Take 1 tablet (80 mg total) by mouth daily.   carvedilol 3.125 MG tablet Commonly known as:  COREG TAKE ONE TABLET BY MOUTH 2 TIMES A DAY WITH A MEAL.   cetirizine 10 MG tablet Commonly known as:  ZYRTEC TAKE ONE TABLET BY MOUTH EVERY DAY. REPLACES LORATADINE.   furosemide 40 MG tablet Commonly known as:  LASIX Take 1 tablet (40 mg total) by mouth daily.   losartan 50 MG tablet Commonly known as:  COZAAR Take 1 tablet (50 mg total) by mouth daily.   multivitamin with minerals Tabs tablet Take 1 tablet by mouth daily.   nitroGLYCERIN 0.4 MG SL  tablet Commonly known as:  NITROSTAT Place 1 tablet (0.4 mg total) under the tongue every 5 (five) minutes as needed for chest pain.   potassium chloride 10 MEQ tablet Commonly known as:  K-DUR Take 1 tablet (10 mEq total) by mouth daily.   spironolactone 25 MG tablet Commonly known as:  ALDACTONE Take 1 tablet (25 mg total) by mouth daily.         Review of Systems deferred    Objective:   Physical Exam   Deferred       Assessment & Plan:  Plan: RTC 6 months. Labs the week prior met c, lipid, and cbc. Meds refilled.

## 2018-06-05 ENCOUNTER — Telehealth: Payer: Self-pay | Admitting: Pharmacist

## 2018-06-05 NOTE — Telephone Encounter (Signed)
06/05/2018 11:51:45 AM - Ventolin HFA provider change  06/05/2018 Received a pharmacy printout for Ventolin HFA same directions, change provider to Dr. Candelaria Stagers, Printed script & taking to [facility], mailing patient her portion to sign & return also requesting more current check stubs (we have Dec & Jan). The [employer] patient portion has been mailed to patient previously 11/22/2017 & 12/02/2017 and not returned.Forde Radon

## 2018-07-03 ENCOUNTER — Telehealth: Payer: Self-pay | Admitting: Pharmacist

## 2018-07-03 NOTE — Telephone Encounter (Signed)
07/03/2018 3:29:54 PM - Ventolin renewal to [place]  07/03/2018 Faxed [employer] renewal application for Ventolin HFA Inhale 1 puff into the lungs every 6 hours as needed for wheezing or shortness of breath. Delos Haring

## 2018-07-15 ENCOUNTER — Telehealth: Payer: Self-pay

## 2018-07-15 NOTE — Telephone Encounter (Signed)

## 2018-07-17 ENCOUNTER — Other Ambulatory Visit: Payer: Self-pay

## 2018-07-17 ENCOUNTER — Encounter: Payer: Self-pay | Admitting: Cardiovascular Disease

## 2018-07-17 ENCOUNTER — Ambulatory Visit (INDEPENDENT_AMBULATORY_CARE_PROVIDER_SITE_OTHER): Payer: Self-pay | Admitting: Cardiovascular Disease

## 2018-07-17 VITALS — BP 140/70 | HR 65 | Temp 97.2°F | Ht 71.0 in | Wt 241.8 lb

## 2018-07-17 DIAGNOSIS — I10 Essential (primary) hypertension: Secondary | ICD-10-CM

## 2018-07-17 DIAGNOSIS — I2510 Atherosclerotic heart disease of native coronary artery without angina pectoris: Secondary | ICD-10-CM

## 2018-07-17 DIAGNOSIS — E785 Hyperlipidemia, unspecified: Secondary | ICD-10-CM

## 2018-07-17 DIAGNOSIS — I5022 Chronic systolic (congestive) heart failure: Secondary | ICD-10-CM

## 2018-07-17 MED ORDER — LOSARTAN POTASSIUM 50 MG PO TABS: 50.0000 mg | ORAL_TABLET | Freq: Every day | ORAL | 1 refills | Status: DC

## 2018-07-17 MED ORDER — CARVEDILOL 3.125 MG PO TABS: ORAL_TABLET | ORAL | 3 refills | Status: DC

## 2018-07-17 MED ORDER — POTASSIUM CHLORIDE ER 10 MEQ PO TBCR: 10.0000 meq | EXTENDED_RELEASE_TABLET | Freq: Every day | ORAL | 3 refills | Status: DC

## 2018-07-17 MED ORDER — FUROSEMIDE 40 MG PO TABS: 40.0000 mg | ORAL_TABLET | Freq: Every day | ORAL | 3 refills | Status: DC

## 2018-07-17 MED ORDER — SPIRONOLACTONE 25 MG PO TABS: 25.0000 mg | ORAL_TABLET | Freq: Every day | ORAL | 3 refills | Status: DC

## 2018-07-17 MED ORDER — ASPIRIN 81 MG PO CHEW: 81.0000 mg | CHEWABLE_TABLET | Freq: Every day | ORAL | 3 refills | Status: DC

## 2018-07-17 MED ORDER — ATORVASTATIN CALCIUM 80 MG PO TABS: 80.0000 mg | ORAL_TABLET | Freq: Every day | ORAL | 3 refills | Status: DC

## 2018-07-17 NOTE — Patient Instructions (Signed)
Medication Instructions:  Your physician recommends that you continue on your current medications as directed. Please refer to the Current Medication list given to you today.  If you need a refill on your cardiac medications before your next appointment, please call your pharmacy.   Lab work: None ordered  If you have labs (blood work) drawn today and your tests are completely normal, you will receive your results only by: . MyChart Message (if you have MyChart) OR . A paper copy in the mail If you have any lab test that is abnormal or we need to change your treatment, we will call you to review the results.  Testing/Procedures: None ordered   Follow-Up: At [facility], you and your health needs are our priority.  As part of our continuing mission to provide you with exceptional heart care, we have created designated Provider Care Teams.  These Care Teams include your primary Cardiologist (physician) and Advanced Practice Providers (APPs -  Physician Assistants and Nurse Practitioners) who all work together to provide you with the care you need, when you need it. You will need a follow up appointment in 12 months.  Please call our office 2 months in advance to schedule this appointment.  You may see Muhammad Arida, MD or one of the following Advanced Practice Providers on your designated Care Team:   Christopher Berge, NP Ryan Dunn, PA-C . Jacquelyn Visser, PA-C   

## 2018-07-17 NOTE — Progress Notes (Signed)
Cardiology Office Note   Date:  07/17/2018   ID:  LENIA MARING, DOB 06-14-1960, MRN [id number]  PCP:  [name], MD  Cardiologist:   Lorine Bears, MD   Chief Complaint  Patient presents with  . other    6 month follow up. "doing well." Meds reviewed by the pt. verbally.       History of Present Illness: Lindsey Gibson is a 58 y.o. female who presents for a follow-up visit regarding coronary artery disease and chronic systolic heart failure.  Other medical problems include hyperlipidemia, obesity and COPD. She was hospitalized in December, 2018 with pulmonary edema and non-ST elevation myocardial infarction.  Echocardiogram showed an EF of 20-25%.  Cardiac catheterization showed severe mid LAD disease which was treated with PCI and drug-eluting stent placement.  She did not tolerate Entresto due to hypotension and recurrent chest pain.   She underwent a repeat echocardiogram in April of 2019 which showed improvement in EF to 50-55%.  There was evidence of grade 2 diastolic dysfunction.  She has been doing extremely well with no recent chest pain, shortness of breath, leg edema or weight gain.  She takes her medications regularly.  Past Medical History:  Diagnosis Date  . Asthma   . CAD (coronary artery disease)    a. 12/2016 NSTEMI/PCI: LM nl, LAD 30ost, 47m (IVUS->severe plaque burden->3.0 x 18 Sierra DES), D1/2/3 nl, LCX 30p, OM1/2/3 nl, RCA nl, RPDA/RPAV/RPL1/2 nl.  . COPD (chronic obstructive pulmonary disease) (HCC)   . Hyperlipidemia   . Hypertension   . Ischemic cardiomyopathy    a. 12/2016 Echo: EF 20-25%, mid-apicalanteroseptal, ant, and apical AK, GR1 DD, triv AI, mildly dil LA; b. 04/2017 Echo: EF 50-55%, Gr2 DD, Nl RV fxn.  . Morbid obesity (HCC)   . Myocardial infarction [facility])     Past Surgical History:  Procedure Laterality Date  . CORONARY STENT INTERVENTION N/A 12/27/2016   Procedure: CORONARY STENT INTERVENTION;  Surgeon: Iran Ouch,  MD;  Location: [facility] INVASIVE CV LAB;  Service: Cardiovascular;  Laterality: N/A;  . INTRAVASCULAR ULTRASOUND/IVUS N/A 12/27/2016   Procedure: Intravascular Ultrasound/IVUS;  Surgeon: Iran Ouch, MD;  Location: [facility] INVASIVE CV LAB;  Service: Cardiovascular;  Laterality: N/A;  . KNEE ARTHROSCOPY Right   . LEFT HEART CATH AND CORONARY ANGIOGRAPHY N/A 12/27/2016   Procedure: LEFT HEART CATH AND CORONARY ANGIOGRAPHY;  Surgeon: Iran Ouch, MD;  Location: [facility] INVASIVE CV LAB;  Service: Cardiovascular;  Laterality: N/A;  . SHOULDER ARTHROSCOPY WITH SUBACROMIAL DECOMPRESSION Right 11/22/2015   Procedure: SHOULDER ARTHROSCOPY WITH DEBRIDEMENT, DECOMPRESSION  AND BICEPS TENODESIS;  Surgeon: Christena Flake, MD;  Location: [facility] ORS;  Service: Orthopedics;  Laterality: Right;  . TONSILLECTOMY    . ULNAR NERVE TRANSPOSITION Right 11/22/2015   Procedure: ULNAR NERVE DECOMPRESSION/TRANSPOSITION;  Surgeon: Christena Flake, MD;  Location: [facility] ORS;  Service: Orthopedics;  Laterality: Right;     Current Outpatient Medications  Medication Sig Dispense Refill  . albuterol (VENTOLIN HFA) 108 (90 Base) MCG/ACT inhaler Inhale 1 puff into the lungs every 6 (six) hours as needed for wheezing or shortness of breath. 1 Inhaler 3  . aspirin 81 MG chewable tablet Chew 1 tablet (81 mg total) by mouth daily. 30 tablet 3  . atorvastatin (LIPITOR) 80 MG tablet Take 1 tablet (80 mg total) by mouth daily. 90 tablet 1  . carvedilol (COREG) 3.125 MG tablet TAKE ONE TABLET BY MOUTH 2 TIMES A DAY WITH A MEAL.  180 tablet 1  . cetirizine (ZYRTEC) 10 MG tablet TAKE ONE TABLET BY MOUTH EVERY DAY. 90 tablet 3  . furosemide (LASIX) 40 MG tablet Take 1 tablet (40 mg total) by mouth daily. 90 tablet 1  . losartan (COZAAR) 50 MG tablet Take 1 tablet (50 mg total) by mouth daily. 90 tablet 1  . Multiple Vitamin (MULTIVITAMIN WITH MINERALS) TABS tablet Take 1 tablet by mouth daily.    . nitroGLYCERIN (NITROSTAT) 0.4 MG SL tablet  Place 1 tablet (0.4 mg total) under the tongue every 5 (five) minutes as needed for chest pain. 25 tablet 3  . potassium chloride (K-DUR) 10 MEQ tablet Take 1 tablet (10 mEq total) by mouth daily. 90 tablet 1  . spironolactone (ALDACTONE) 25 MG tablet Take 1 tablet (25 mg total) by mouth daily. 90 tablet 1   No current facility-administered medications for this visit.     Allergies:   Penicillins    Social History:  The patient  reports that she has quit smoking. Her smoking use included cigarettes. She has a 18.00 pack-year smoking history. She has never used smokeless tobacco. She reports that she does not drink alcohol or use drugs.   Family History:  The patient's family history includes Diabetes in her mother and sister; Heart disease in her mother.    ROS:  Please see the history of present illness.   Otherwise, review of systems are positive for none.   All other systems are reviewed and negative.    PHYSICAL EXAM: VS:  BP 140/70 (BP Location: Left Arm, Patient Position: Sitting, Cuff Size: Normal)   Pulse 65   Temp (!) 97.2 F (36.2 C)   Ht 5\' 11"  (1.803 m)   Wt 241 lb 12 oz (109.7 kg)   BMI 33.72 kg/m  , BMI Body mass index is 33.72 kg/m. GEN: Well nourished, well developed, in no acute distress  HEENT: normal  Neck: no JVD, carotid bruits, or masses Cardiac: RRR; no murmurs, rubs, or gallops, mild bilateral edema  Respiratory:  clear to auscultation bilaterally, normal work of breathing GI: soft, nontender, nondistended, + BS MS: no deformity or atrophy  Skin: warm and dry, no rash Neuro:  Strength and sensation are intact Psych: euthymic mood, full affect   EKG:  EKG is ordered today. EKG showed normal sinus rhythm with no significant ST or T wave changes.   Recent Labs: 05/28/2018: ALT 16; BUN 13; Creatinine, Ser 0.82; Hemoglobin 11.8; Platelets 224; Potassium 4.2; Sodium 140; TSH 0.668    Lipid Panel    Component Value Date/Time   CHOL 119 05/28/2018  0935   TRIG 107 05/28/2018 0935   HDL 47 05/28/2018 0935   CHOLHDL 2.5 05/28/2018 0935   CHOLHDL 4.7 12/28/2016 0642   VLDL 24 12/28/2016 0642   LDLCALC 51 05/28/2018 0935      Wt Readings from Last 3 Encounters:  07/17/18 241 lb 12 oz (109.7 kg)  01/29/18 240 lb (108.9 kg)  01/17/18 245 lb (111.1 kg)       No flowsheet data found.    ASSESSMENT AND PLAN:  1.  Coronary artery disease involving native coronary arteries without angina: Continue aspirin indefinitely.  2.  Chronic systolic heart failure: Ejection fraction improved to 50-55%.  She appears to be euvolemic on current dose of furosemide 40 mg once daily.   She is otherwise on optimal medical therapy for heart failure.  I reviewed recent labs in May which showed normal renal function and  electrolytes.  3.  Hyperlipidemia: Continue high-dose atorvastatin.  Most recent LDL was 51 which is at target.  4.  Essential hypertension: Blood pressure is mildly elevated.  I made no changes today.  If blood pressure continues to be elevated, recommend increasing losartan to 100 mg daily.    Disposition:   FU with me in 12 months  Signed,  Lorine BearsMuhammad Larence Thone, MD  07/17/2018 11:17 AM    [facility]

## 2018-08-27 ENCOUNTER — Telehealth: Payer: Self-pay | Admitting: Pharmacist

## 2018-08-27 NOTE — Telephone Encounter (Signed)
08/27/2018 10:51:04 AM - Ventolin HFA refill  08/27/2018 Placed refill online with [facility] for Ventolin HFA, to ship 10/02/2018, order# [id number].Delos Haring

## 2018-11-26 ENCOUNTER — Other Ambulatory Visit: Payer: Self-pay

## 2018-11-26 DIAGNOSIS — T7840XA Allergy, unspecified, initial encounter: Secondary | ICD-10-CM

## 2018-11-27 LAB — COMPREHENSIVE METABOLIC PANEL
ALT: 20 IU/L (ref 0–32)
AST: 20 IU/L (ref 0–40)
Albumin/Globulin Ratio: 1.8 (ref 1.2–2.2)
Albumin: 4.2 g/dL (ref 3.8–4.9)
Alkaline Phosphatase: 103 IU/L (ref 39–117)
BUN/Creatinine Ratio: 18 (ref 9–23)
BUN: 14 mg/dL (ref 6–24)
Bilirubin Total: 1.2 mg/dL (ref 0.0–1.2)
CO2: 25 mmol/L (ref 20–29)
Calcium: 9.1 mg/dL (ref 8.7–10.2)
Chloride: 102 mmol/L (ref 96–106)
Creatinine, Ser: 0.79 mg/dL (ref 0.57–1.00)
GFR calc Af Amer: 95 mL/min/{1.73_m2} (ref >59)
GFR calc non Af Amer: 82 mL/min/{1.73_m2} (ref >59)
Globulin, Total: 2.4 g/dL (ref 1.5–4.5)
Glucose: 96 mg/dL (ref 65–99)
Potassium: 4.6 mmol/L (ref 3.5–5.2)
Sodium: 140 mmol/L (ref 134–144)
Total Protein: 6.6 g/dL (ref 6.0–8.5)

## 2018-11-27 LAB — CBC WITH DIFFERENTIAL
Basophils Absolute: 0 10*3/uL (ref 0.0–0.2)
Basos: 0 %
EOS (ABSOLUTE): 0.3 10*3/uL (ref 0.0–0.4)
Eos: 4 %
Hematocrit: 36 % (ref 34.0–46.6)
Hemoglobin: 11.8 g/dL (ref 11.1–15.9)
Immature Grans (Abs): 0 10*3/uL (ref 0.0–0.1)
Immature Granulocytes: 0 %
Lymphocytes Absolute: 2.1 10*3/uL (ref 0.7–3.1)
Lymphs: 36 %
MCH: 27.1 pg (ref 26.6–33.0)
MCHC: 32.8 g/dL (ref 31.5–35.7)
MCV: 83 fL (ref 79–97)
Monocytes Absolute: 0.4 10*3/uL (ref 0.1–0.9)
Monocytes: 6 %
Neutrophils Absolute: 3.1 10*3/uL (ref 1.4–7.0)
Neutrophils: 54 %
RBC: 4.35 x10E6/uL (ref 3.77–5.28)
RDW: 13.9 % (ref 11.7–15.4)
WBC: 5.9 10*3/uL (ref 3.4–10.8)

## 2018-11-27 LAB — LIPID PANEL
Chol/HDL Ratio: 2.9 ratio (ref 0.0–4.4)
Cholesterol, Total: 133 mg/dL (ref 100–199)
HDL: 46 mg/dL (ref >39)
LDL Chol Calc (NIH): 66 mg/dL (ref 0–99)
Triglycerides: 117 mg/dL (ref 0–149)
VLDL Cholesterol Cal: 21 mg/dL (ref 5–40)

## 2018-12-03 ENCOUNTER — Ambulatory Visit: Payer: Self-pay | Admitting: Internal Medicine

## 2018-12-03 ENCOUNTER — Other Ambulatory Visit: Payer: Self-pay

## 2018-12-03 DIAGNOSIS — I259 Chronic ischemic heart disease, unspecified: Secondary | ICD-10-CM

## 2018-12-03 DIAGNOSIS — I10 Essential (primary) hypertension: Secondary | ICD-10-CM

## 2018-12-03 NOTE — Progress Notes (Signed)
   Subjective:    Patient ID: Lindsey Gibson, female    DOB: 03-25-1960, 58 y.o.   MRN: [id number]  HPI  Patient is a 58 year old female presenting for a telephonic follow up. Pt reports she is well.  Pt reports that she has a BP monitor and that her BP usually reads around 115/80. Patient reports she checks her BP at various times of days.    Review of Systems Patient Active Problem List   Diagnosis Date Noted  . Pneumonia 12/27/2016  . NSTEMI (non-ST elevated myocardial infarction) ([place])    Allergies as of 12/03/2018      Reactions   Penicillins Rash   Has patient had a PCN reaction causing immediate rash, facial/tongue/throat swelling, SOB If all of the above answers are "NO", then may proceed with Cephalosporin use.  or lightheadedness with hypotension:unsure Has patient had a PCN reaction causing severe rash involving mucus membranes or skin necrosis:unsure Has patient had a PCN reaction that required hospitalization:unsure Has patient had a PCN reaction occurring within the last 10 years:unsure Childhood reaction      Medication List       Accurate as of December 03, 2018  9:17 AM. If you have any questions, ask your nurse or doctor.        albuterol 108 (90 Base) MCG/ACT inhaler Commonly known as: VENTOLIN HFA Inhale 1 puff into the lungs every 6 (six) hours as needed for wheezing or shortness of breath.   aspirin 81 MG chewable tablet Chew 1 tablet (81 mg total) by mouth daily.   atorvastatin 80 MG tablet Commonly known as: LIPITOR Take 1 tablet (80 mg total) by mouth daily.   carvedilol 3.125 MG tablet Commonly known as: COREG TAKE ONE TABLET BY MOUTH 2 TIMES A DAY WITH A MEAL.   cetirizine 10 MG tablet Commonly known as: ZYRTEC TAKE ONE TABLET BY MOUTH EVERY DAY.   furosemide 40 MG tablet Commonly known as: LASIX Take 1 tablet (40 mg total) by mouth daily.   losartan 50 MG tablet Commonly known as: COZAAR Take 1 tablet (50 mg total) by mouth  daily.   multivitamin with minerals Tabs tablet Take 1 tablet by mouth daily.   nitroGLYCERIN 0.4 MG SL tablet Commonly known as: NITROSTAT Place 1 tablet (0.4 mg total) under the tongue every 5 (five) minutes as needed for chest pain.   potassium chloride 10 MEQ tablet Commonly known as: KLOR-CON Take 1 tablet (10 mEq total) by mouth daily.   spironolactone 25 MG tablet Commonly known as: ALDACTONE Take 1 tablet (25 mg total) by mouth daily.          Objective:   Physical Exam   No PE - telephonic visit     Assessment & Plan:  1. Hypertension, unspecified type Pt monitors her BP twice weekly with home kit. Reports running 536 or less systolically and 80 or less diastolically consistently. - Comp Met (CMET); Future - UA/M w/rflx Culture, Routine; Future - Lipid Profile; Future - TSH; Future - CBC; Future  2. Ischemic heart disease Pt reports no new symptoms and is doing well. Cardiology evaluation in July found her stable and her cardiovascular echocardiogram significantly improved approaching normality.    Plan is to continue current medications unchanged to be renewed when they are due for a year. Keep her yearly cardiology appointment in July as scheduled. Office FU with [place] in 6 months with labs prior to the visit.

## 2018-12-10 ENCOUNTER — Telehealth: Payer: Self-pay | Admitting: Pharmacist

## 2018-12-10 NOTE — Telephone Encounter (Signed)
12/10/2018 3:30:02 PM - Ventolin refill online with [facility]  12/10/2018 I refilled Ventolin online with [facility], to ship 12/23/2018, order# [id number].Delos Haring

## 2019-02-02 ENCOUNTER — Ambulatory Visit: Payer: Self-pay | Admitting: Pharmacist

## 2019-02-02 ENCOUNTER — Other Ambulatory Visit: Payer: Self-pay

## 2019-02-02 DIAGNOSIS — Z79899 Other long term (current) drug therapy: Secondary | ICD-10-CM

## 2019-02-02 NOTE — Progress Notes (Signed)
[facility] Visit Note  Patient: Lindsey Gibson MRN: [id number] Date of Birth: [birth year] PCP: [name], MD   [name] 59 y.o. female presents for a MTM visit today.  ID verified with name and DOB.  There were no vitals taken for this visit.  Patient Information   Past Medical History:  Diagnosis Date  . Asthma   . CAD (coronary artery disease)    a. 12/2016 NSTEMI/PCI: LM nl, LAD 30ost, 16m (IVUS->severe plaque burden->3.0 x 18 Sierra DES), D1/2/3 nl, LCX 30p, OM1/2/3 nl, RCA nl, RPDA/RPAV/RPL1/2 nl.  . COPD (chronic obstructive pulmonary disease) (HCC)   . Hyperlipidemia   . Hypertension   . Ischemic cardiomyopathy    a. 12/2016 Echo: EF 20-25%, mid-apicalanteroseptal, ant, and apical AK, GR1 DD, triv AI, mildly dil LA; b. 04/2017 Echo: EF 50-55%, Gr2 DD, Nl RV fxn.  . Morbid obesity (HCC)   . Myocardial infarction [facility])       Past Surgical History:  Procedure Laterality Date  . CORONARY STENT INTERVENTION N/A 12/27/2016   Procedure: CORONARY STENT INTERVENTION;  Surgeon: Iran Ouch, MD;  Location: [facility] INVASIVE CV LAB;  Service: Cardiovascular;  Laterality: N/A;  . INTRAVASCULAR ULTRASOUND/IVUS N/A 12/27/2016   Procedure: Intravascular Ultrasound/IVUS;  Surgeon: Iran Ouch, MD;  Location: [facility] INVASIVE CV LAB;  Service: Cardiovascular;  Laterality: N/A;  . KNEE ARTHROSCOPY Right   . LEFT HEART CATH AND CORONARY ANGIOGRAPHY N/A 12/27/2016   Procedure: LEFT HEART CATH AND CORONARY ANGIOGRAPHY;  Surgeon: Iran Ouch, MD;  Location: [facility] INVASIVE CV LAB;  Service: Cardiovascular;  Laterality: N/A;  . SHOULDER ARTHROSCOPY WITH SUBACROMIAL DECOMPRESSION Right 11/22/2015   Procedure: SHOULDER ARTHROSCOPY WITH DEBRIDEMENT, DECOMPRESSION  AND BICEPS TENODESIS;  Surgeon: Christena Flake, MD;  Location: [facility] ORS;  Service: Orthopedics;  Laterality: Right;  . TONSILLECTOMY    . ULNAR NERVE TRANSPOSITION Right 11/22/2015   Procedure:  ULNAR NERVE DECOMPRESSION/TRANSPOSITION;  Surgeon: Christena Flake, MD;  Location: [facility] ORS;  Service: Orthopedics;  Laterality: Right;     Family History  Problem Relation Age of Onset  . Heart disease Mother   . Diabetes Mother   . Diabetes Sister      Family Support: Good  Lifestyle Diet: Breakfast: Does not have breakfast Lunch: Salad, rice, or spaghetti Dinner: Same Drinks: Water, watermelon-flavored water, diet soda  Walks 30-45 min, 2-3 times weekly          Social History   Substance and Sexual Activity  Alcohol Use No      Social History   Tobacco Use  Smoking Status Former Smoker  . Packs/day: 1.00  . Years: 18.00  . Pack years: 18.00  . Types: Cigarettes  Smokeless Tobacco Never Used      Health Maintenance  Topic Date Due  . Hepatitis C Screening  01/09/1960  . TETANUS/TDAP  05/10/1978  . PAP SMEAR-Modifier  05/09/1980  . MAMMOGRAM  05/09/2009  . COLONOSCOPY  05/09/2009  . INFLUENZA VACCINE  08/02/2018  . HIV Screening  Completed     Assessment and Plan:  Last ED visit: December 2018  Last Visit to PCP: November/December 2020  Next Visit to PCP: July Kirke Corin), May Sibyl Parr)  Specialist Visit: Cardiology, July  Dental Exam: no  Eye Exam: long time  Prostate Exam: NA  Pelvic/PAP Exam: long time  Mammogram: long time  DEXA: NA  Colonoscopy: no  Flu Vaccine: no  Pneumonia Vaccine: yes    Assessment and Plan:  Adherence:  No.  She manages her own medications, and reports good family support.     NSTEMI: aspirin, atorvastatin, carvedilol, losartan and emergency nitrostat. PCI with DES 12/2016. Follows with [facility] (Dr. Fletcher Anon).    HF: ischemic cardiomyopathy per Cardio. EF 20-25% 12/2016 following MI, but recovered to 50-55% 04/2017. Carvedilol, losartan, spironolactone, furosemide, KCl No issues with medications.   HTN: meds as above. Controlled in office today. Checks at home and runs around 110-115/60-70, may increase  slightly at MD appointments. Reports good diet and exercise.   HLD: atorvastatin. Lipid panel WNL (11/2018). Controlled. Diet and exercise as above. No myalgias   COPD: albuterol PRN. Last used 2 months ago.     Seasonal Allergies:  Cetirizine   Follow-up in 1 year.  Gerald Dexter, PharmD 02/02/2019 3:01 PM

## 2019-03-06 ENCOUNTER — Telehealth: Payer: Self-pay | Admitting: Pharmacist

## 2019-03-06 NOTE — Telephone Encounter (Signed)
03/06/2019 3:40:12 PM - Ventolin refill online with [facility]  -- Rhetta Mura - Friday, March 06, 2019 3:39 PM -- Placed refill online with [facility] for Ventolin, to ship 03/19/2019, Order# [id number].

## 2019-04-02 ENCOUNTER — Telehealth: Payer: Self-pay | Admitting: Pharmacy Technician

## 2019-04-02 NOTE — Telephone Encounter (Signed)
Received updated proof of income.  Patient eligible to receive medication assistance at [facility] until time for re-certification in 9359, and as long as eligibility requirements continue to be met.  [place] [facility]

## 2019-04-30 ENCOUNTER — Telehealth: Payer: Self-pay | Admitting: Pharmacist

## 2019-04-30 NOTE — Telephone Encounter (Signed)
04/30/2019 8:25:36 AM - Ventolin script to provider for refill  -- Lindsey Gibson - Thursday, April 30, 2019 8:24 AM --Printed script for Ventolin HFA, sending to [facility] for provider to sign to send to [facility] for refill.

## 2019-05-27 ENCOUNTER — Other Ambulatory Visit: Payer: Self-pay

## 2019-05-27 DIAGNOSIS — I10 Essential (primary) hypertension: Secondary | ICD-10-CM

## 2019-05-31 LAB — UA/M W/RFLX CULTURE, ROUTINE
Bilirubin, UA: NEGATIVE
Glucose, UA: NEGATIVE
Ketones, UA: NEGATIVE
Nitrite, UA: NEGATIVE
Protein,UA: NEGATIVE
RBC, UA: NEGATIVE
Specific Gravity, UA: 1.01 (ref 1.005–1.030)
Urobilinogen, Ur: 0.2 mg/dL (ref 0.2–1.0)
pH, UA: 5.5 (ref 5.0–7.5)

## 2019-05-31 LAB — CBC
Hematocrit: 36.7 % (ref 34.0–46.6)
Hemoglobin: 12.3 g/dL (ref 11.1–15.9)
MCH: 27.3 pg (ref 26.6–33.0)
MCHC: 33.5 g/dL (ref 31.5–35.7)
MCV: 81 fL (ref 79–97)
Platelets: 227 10*3/uL (ref 150–450)
RBC: 4.51 x10E6/uL (ref 3.77–5.28)
RDW: 13.9 % (ref 11.7–15.4)
WBC: 6.2 10*3/uL (ref 3.4–10.8)

## 2019-05-31 LAB — COMPREHENSIVE METABOLIC PANEL
ALT: 24 IU/L (ref 0–32)
AST: 25 IU/L (ref 0–40)
Albumin/Globulin Ratio: 1.7 (ref 1.2–2.2)
Albumin: 4.3 g/dL (ref 3.8–4.9)
Alkaline Phosphatase: 99 IU/L (ref 48–121)
BUN/Creatinine Ratio: 18 (ref 12–28)
BUN: 15 mg/dL (ref 8–27)
Bilirubin Total: 1.3 mg/dL — ABNORMAL HIGH (ref 0.0–1.2)
CO2: 23 mmol/L (ref 20–29)
Calcium: 9.4 mg/dL (ref 8.7–10.3)
Chloride: 104 mmol/L (ref 96–106)
Creatinine, Ser: 0.82 mg/dL (ref 0.57–1.00)
GFR calc Af Amer: 90 mL/min/{1.73_m2} (ref >59)
GFR calc non Af Amer: 78 mL/min/{1.73_m2} (ref >59)
Globulin, Total: 2.6 g/dL (ref 1.5–4.5)
Glucose: 94 mg/dL (ref 65–99)
Potassium: 4.3 mmol/L (ref 3.5–5.2)
Sodium: 141 mmol/L (ref 134–144)
Total Protein: 6.9 g/dL (ref 6.0–8.5)

## 2019-05-31 LAB — TSH: TSH: 0.751 u[IU]/mL (ref 0.450–4.500)

## 2019-05-31 LAB — MICROSCOPIC EXAMINATION
Casts: NONE SEEN /lpf
RBC, Urine: NONE SEEN /hpf (ref 0–2)

## 2019-05-31 LAB — LIPID PANEL
Chol/HDL Ratio: 2.8 ratio (ref 0.0–4.4)
Cholesterol, Total: 133 mg/dL (ref 100–199)
HDL: 47 mg/dL (ref >39)
LDL Chol Calc (NIH): 61 mg/dL (ref 0–99)
Triglycerides: 147 mg/dL (ref 0–149)
VLDL Cholesterol Cal: 25 mg/dL (ref 5–40)

## 2019-05-31 LAB — URINE CULTURE, REFLEX

## 2019-06-03 ENCOUNTER — Ambulatory Visit: Payer: Self-pay | Admitting: Internal Medicine

## 2019-06-03 ENCOUNTER — Other Ambulatory Visit: Payer: Self-pay

## 2019-06-03 DIAGNOSIS — I259 Chronic ischemic heart disease, unspecified: Secondary | ICD-10-CM

## 2019-06-03 DIAGNOSIS — J449 Chronic obstructive pulmonary disease, unspecified: Secondary | ICD-10-CM | POA: Insufficient documentation

## 2019-06-03 DIAGNOSIS — I10 Essential (primary) hypertension: Secondary | ICD-10-CM

## 2019-06-03 DIAGNOSIS — J45909 Unspecified asthma, uncomplicated: Secondary | ICD-10-CM | POA: Insufficient documentation

## 2019-06-03 DIAGNOSIS — E785 Hyperlipidemia, unspecified: Secondary | ICD-10-CM

## 2019-06-03 NOTE — Progress Notes (Signed)
Subjective:    Patient ID: Lindsey Gibson, female    DOB: 07-31-1960, 59 y.o.   MRN: [id number]  HPI  Patient is a 59 year old female presenting for a six-month followup. Patient reports she is getting along well. Patient reports weight gain, but states she walks everyday. Pt reports she is experiencing swelling in her hands. Pt declines burning upon urination.   Review of Systems Patient Active Problem List   Diagnosis Date Noted  . Pneumonia 12/27/2016  . NSTEMI (non-ST elevated myocardial infarction) ([place])    Allergies as of 06/03/2019      Reactions   Penicillins Anaphylaxis   Has patient had a PCN reaction causing immediate rash, facial/tongue/throat swelling, SOB If all of the above answers are "NO", then may proceed with Cephalosporin use.  or lightheadedness with hypotension:unsure Has patient had a PCN reaction causing severe rash involving mucus membranes or skin necrosis:unsure Has patient had a PCN reaction that required hospitalization:unsure Has patient had a PCN reaction occurring within the last 10 years:unsure Childhood reaction      Medication List       Accurate as of June 03, 2019  9:10 AM. If you have any questions, ask your nurse or doctor.        albuterol 108 (90 Base) MCG/ACT inhaler Commonly known as: VENTOLIN HFA Inhale 1 puff into the lungs every 6 (six) hours as needed for wheezing or shortness of breath.   aspirin 81 MG chewable tablet Chew 1 tablet (81 mg total) by mouth daily.   atorvastatin 80 MG tablet Commonly known as: LIPITOR Take 1 tablet (80 mg total) by mouth daily.   carvedilol 3.125 MG tablet Commonly known as: COREG TAKE ONE TABLET BY MOUTH 2 TIMES A DAY WITH A MEAL.   cetirizine 10 MG tablet Commonly known as: ZYRTEC TAKE ONE TABLET BY MOUTH EVERY DAY.   furosemide 40 MG tablet Commonly known as: LASIX Take 1 tablet (40 mg total) by mouth daily.   losartan 50 MG tablet Commonly known as: COZAAR Take 1 tablet (50 mg  total) by mouth daily.   multivitamin with minerals Tabs tablet Take 1 tablet by mouth daily.   nitroGLYCERIN 0.4 MG SL tablet Commonly known as: NITROSTAT Place 1 tablet (0.4 mg total) under the tongue every 5 (five) minutes as needed for chest pain.   potassium chloride 10 MEQ tablet Commonly known as: KLOR-CON Take 1 tablet (10 mEq total) by mouth daily.   spironolactone 25 MG tablet Commonly known as: ALDACTONE Take 1 tablet (25 mg total) by mouth daily.         Objective:   Physical Exam  No PE - telephonic visit      Assessment & Plan:  1. Hypertension, unspecified type BP appears to be stable at 125/79.  - Comp Met (CMET) - CBC w/Diff - UA/M w/rflx Culture, Routine - Lipid Profile - TSH  2. Ischemic heart disease Voices no new symptoms. Has a FU appt with cardiologist in a few weeks.   3. Hyperlipidemia, unspecified hyperlipidemia type Labs appear to be at target.   4. Chronic obstructive pulmonary disease, unspecified COPD type ([place])   5. Asthma, unspecified asthma severity, unspecified whether complicated, unspecified whether persistent Clinically appears stable.  6. Morbid obesity ([place]) Weight appears to be up but pt is unclear why. Reports no significant lower extremity edema that might suggest fluid retention.  7. Abnormal urine analysis Appears to be asymptomatic. Plan repeat urine and culture in  a few days.   FU in 6 months with appropriate labs. Encouraged her to get her [place], she expressed some resistance in doing so.

## 2019-06-10 ENCOUNTER — Other Ambulatory Visit: Payer: Self-pay

## 2019-06-17 ENCOUNTER — Other Ambulatory Visit: Payer: Self-pay

## 2019-06-17 DIAGNOSIS — I10 Essential (primary) hypertension: Secondary | ICD-10-CM

## 2019-06-17 NOTE — Addendum Note (Signed)
Addended by: Benjamin Stain on: 06/17/2019 11:56 AM   Modules accepted: Orders

## 2019-06-17 NOTE — Progress Notes (Signed)
[facility]

## 2019-06-20 LAB — UA/M W/RFLX CULTURE, ROUTINE
Bilirubin, UA: NEGATIVE
Glucose, UA: NEGATIVE
Ketones, UA: NEGATIVE
Nitrite, UA: NEGATIVE
Protein,UA: NEGATIVE
RBC, UA: NEGATIVE
Specific Gravity, UA: 1.011 (ref 1.005–1.030)
Urobilinogen, Ur: 0.2 mg/dL (ref 0.2–1.0)
pH, UA: 6.5 (ref 5.0–7.5)

## 2019-06-20 LAB — MICROSCOPIC EXAMINATION
Bacteria, UA: NONE SEEN
Casts: NONE SEEN /lpf
RBC, Urine: NONE SEEN /hpf (ref 0–2)

## 2019-06-20 LAB — URINE CULTURE, REFLEX

## 2019-06-24 ENCOUNTER — Other Ambulatory Visit: Payer: Self-pay

## 2019-06-24 ENCOUNTER — Other Ambulatory Visit: Payer: Self-pay | Admitting: Internal Medicine

## 2019-06-24 ENCOUNTER — Encounter: Payer: Self-pay | Admitting: Internal Medicine

## 2019-06-24 ENCOUNTER — Ambulatory Visit: Payer: Self-pay | Admitting: Internal Medicine

## 2019-06-24 ENCOUNTER — Other Ambulatory Visit: Payer: Self-pay | Admitting: Cardiovascular Disease

## 2019-06-24 DIAGNOSIS — R8289 Other abnormal findings on cytological and histological examination of urine: Secondary | ICD-10-CM

## 2019-06-24 DIAGNOSIS — I214 Non-ST elevation (NSTEMI) myocardial infarction: Secondary | ICD-10-CM

## 2019-06-24 DIAGNOSIS — Z0000 Encounter for general adult medical examination without abnormal findings: Secondary | ICD-10-CM

## 2019-06-24 NOTE — Progress Notes (Signed)
Established Patient Office Visit  Subjective:  Patient ID: Lindsey Gibson, female    DOB: 01-Dec-1960  Age: 59 y.o. MRN: [id number]  CC: No chief complaint on file.   [name] presents for FU apt.  Blood test look good.  Last BP reading taken at clinic was 125/79. Has an appointment with cardiologist July 15.   Past Medical History:  Diagnosis Date  . Asthma   . CAD (coronary artery disease)    a. 12/2016 NSTEMI/PCI: LM nl, LAD 30ost, 97m (IVUS->severe plaque burden->3.0 x 18 Sierra DES), D1/2/3 nl, LCX 30p, OM1/2/3 nl, RCA nl, RPDA/RPAV/RPL1/2 nl.  . COPD (chronic obstructive pulmonary disease) ([place])   . Hyperlipidemia   . Hypertension   . Ischemic cardiomyopathy    a. 12/2016 Echo: EF 20-25%, mid-apicalanteroseptal, ant, and apical AK, GR1 DD, triv AI, mildly dil LA; b. 04/2017 Echo: EF 50-55%, Gr2 DD, Nl RV fxn.  . Morbid obesity ([place])   . Myocardial infarction [facility])     Past Surgical History:  Procedure Laterality Date  . CORONARY STENT INTERVENTION N/A 12/27/2016   Procedure: CORONARY STENT INTERVENTION;  Surgeon: Wellington Hampshire, MD;  Location: [place] [facility];  Service: Cardiovascular;  Laterality: N/A;  . INTRAVASCULAR ULTRASOUND/IVUS N/A 12/27/2016   Procedure: Intravascular Ultrasound/IVUS;  Surgeon: Wellington Hampshire, MD;  Location: [place] [facility];  Service: Cardiovascular;  Laterality: N/A;  . KNEE ARTHROSCOPY Right   . LEFT HEART CATH AND CORONARY ANGIOGRAPHY N/A 12/27/2016   Procedure: LEFT HEART CATH AND CORONARY ANGIOGRAPHY;  Surgeon: Wellington Hampshire, MD;  Location: [place] [facility];  Service: Cardiovascular;  Laterality: N/A;  . SHOULDER ARTHROSCOPY WITH SUBACROMIAL DECOMPRESSION Right 11/22/2015   Procedure: SHOULDER ARTHROSCOPY WITH DEBRIDEMENT, DECOMPRESSION  AND BICEPS TENODESIS;  Surgeon: Corky Mull, MD;  Location: [facility];  Service: Orthopedics;  Laterality: Right;  . TONSILLECTOMY    . ULNAR NERVE TRANSPOSITION Right  11/22/2015   Procedure: ULNAR NERVE DECOMPRESSION/TRANSPOSITION;  Surgeon: Corky Mull, MD;  Location: [facility];  Service: Orthopedics;  Laterality: Right;    Family History  Problem Relation Age of Onset  . Heart disease Mother   . Diabetes Mother   . Diabetes Sister     Social History   Socioeconomic History  . Marital status: Married    Spouse name: Not on file  . Number of children: Not on file  . Years of education: Not on file  . Highest education level: Not on file  Occupational History  . Occupation: none  Tobacco Use  . Smoking status: Former Smoker    Packs/day: 1.00    Years: 18.00    Pack years: 18.00    Types: Cigarettes  . Smokeless tobacco: Never Used  Vaping Use  . Vaping Use: Never used  Substance and Sexual Activity  . Alcohol use: No  . Drug use: No  . Sexual activity: Not on file  Other Topics Concern  . Not on file  Social History Narrative   Rents with family. Not on food stamps.   Social Determinants of Health   Financial Resource Strain:   . Difficulty of Paying Living Expenses:   Food Insecurity:   . Worried About Charity fundraiser in the Last Year:   . Arboriculturist in the Last Year:   Transportation Needs:   . Film/video editor (Medical):   Marland Kitchen Lack of Transportation (Non-Medical):   Physical Activity:   . Days of  Exercise per Week:   . Minutes of Exercise per Session:   Stress:   . Feeling of Stress :   Social Connections:   . Frequency of Communication with Friends and Family:   . Frequency of Social Gatherings with Friends and Family:   . Attends Religious Services:   . Active Member of Clubs or Organizations:   . Attends Banker Meetings:   Marland Kitchen Marital Status:   Intimate Partner Violence:   . Fear of Current or Ex-Partner:   . Emotionally Abused:   Marland Kitchen Physically Abused:   . Sexually Abused:     Outpatient Medications Prior to Visit  Medication Sig Dispense Refill  . aspirin 81 MG chewable tablet  Chew 1 tablet (81 mg total) by mouth daily. 90 tablet 3  . atorvastatin (LIPITOR) 80 MG tablet Take 1 tablet (80 mg total) by mouth daily. 90 tablet 3  . carvedilol (COREG) 3.125 MG tablet TAKE ONE TABLET BY MOUTH 2 TIMES A DAY WITH A MEAL. 180 tablet 3  . cetirizine (ZYRTEC) 10 MG tablet TAKE ONE TABLET BY MOUTH EVERY DAY. 90 tablet 0  . furosemide (LASIX) 40 MG tablet Take 1 tablet (40 mg total) by mouth daily. 90 tablet 3  . losartan (COZAAR) 50 MG tablet Take 1 tablet (50 mg total) by mouth daily. 90 tablet 1  . Multiple Vitamin (MULTIVITAMIN WITH MINERALS) TABS tablet Take 1 tablet by mouth daily.    . nitroGLYCERIN (NITROSTAT) 0.4 MG SL tablet Place 1 tablet (0.4 mg total) under the tongue every 5 (five) minutes as needed for chest pain. 25 tablet 3  . potassium chloride (K-DUR) 10 MEQ tablet Take 1 tablet (10 mEq total) by mouth daily. 90 tablet 3  . spironolactone (ALDACTONE) 25 MG tablet Take 1 tablet (25 mg total) by mouth daily. 90 tablet 3  . VENTOLIN HFA 108 (90 Base) MCG/ACT inhaler INHALE 1 PUFF INTO THE LUNGS EVERY 6 HOURS AS NEEDED FOR WHEEZING OR SHORTNESS OF BREATH 54 g 0   No facility-administered medications prior to visit.    Allergies  Allergen Reactions  . Penicillins Anaphylaxis    Has patient had a PCN reaction causing immediate rash, facial/tongue/throat swelling, SOB If all of the above answers are "NO", then may proceed with Cephalosporin use.   or lightheadedness with hypotension:unsure Has patient had a PCN reaction causing severe rash involving mucus membranes or skin necrosis:unsure Has patient had a PCN reaction that required hospitalization:unsure Has patient had a PCN reaction occurring within the last 10 years:unsure  Childhood reaction    ROS Review of Systems    Objective:    Physical Exam  There were no vitals taken for this visit. Wt Readings from Last 3 Encounters:  05/27/19 253 lb 3.2 oz (114.9 kg)  07/17/18 241 lb 12 oz (109.7 kg)   01/29/18 240 lb (108.9 kg)     Health Maintenance Due  Topic Date Due  . Hepatitis C Screening  Never done  . COVID-19 Vaccine (1) Never done  . TETANUS/TDAP  Never done  . PAP SMEAR-Modifier  Never done  . MAMMOGRAM  Never done  . COLONOSCOPY  Never done    There are no preventive care reminders to display for this patient.  Lab Results  Component Value Date   TSH 0.751 05/27/2019   Lab Results  Component Value Date   WBC 6.2 05/27/2019   HGB 12.3 05/27/2019   HCT 36.7 05/27/2019   MCV 81 05/27/2019  PLT 227 05/27/2019   Lab Results  Component Value Date   NA 141 05/27/2019   K 4.3 05/27/2019   CO2 23 05/27/2019   GLUCOSE 94 05/27/2019   BUN 15 05/27/2019   CREATININE 0.82 05/27/2019   BILITOT 1.3 (H) 05/27/2019   ALKPHOS 99 05/27/2019   AST 25 05/27/2019   ALT 24 05/27/2019   PROT 6.9 05/27/2019   ALBUMIN 4.3 05/27/2019   CALCIUM 9.4 05/27/2019   ANIONGAP 8 04/16/2017   Lab Results  Component Value Date   CHOL 133 05/27/2019   Lab Results  Component Value Date   HDL 47 05/27/2019   Lab Results  Component Value Date   LDLCALC 61 05/27/2019   Lab Results  Component Value Date   TRIG 147 05/27/2019   Lab Results  Component Value Date   CHOLHDL 2.8 05/27/2019   No results found for: HGBA1C    Assessment & Plan:   Problem List Items Addressed This Visit    None      Abnormal urine analysis Follow up UA and reflex because of abnormal reading several weeks ago. Looked improve but culture could not be obtained due to improper handeling of specimen. Etiology was unclear. Was clean catch analysis, plan to repeat UA with culture in few weeks.   Keep previously scheduled follow up apts, and labs in 6 months.   No orders of the defined types were placed in this encounter.   Follow-up: No follow-ups on file.  Has follow apt. Scheduled in 6 months with labs scheduled.  Has apt. in August for repeat lab.    Tamarra Geiselman Raina Mina

## 2019-07-16 ENCOUNTER — Ambulatory Visit: Payer: Self-pay | Admitting: Cardiovascular Disease

## 2019-07-16 ENCOUNTER — Ambulatory Visit (INDEPENDENT_AMBULATORY_CARE_PROVIDER_SITE_OTHER): Payer: Self-pay | Admitting: Cardiovascular Disease

## 2019-07-16 ENCOUNTER — Other Ambulatory Visit: Payer: Self-pay

## 2019-07-16 ENCOUNTER — Other Ambulatory Visit: Payer: Self-pay | Admitting: Cardiovascular Disease

## 2019-07-16 ENCOUNTER — Encounter: Payer: Self-pay | Admitting: Cardiovascular Disease

## 2019-07-16 VITALS — BP 130/90 | HR 79 | Ht 71.0 in | Wt 253.0 lb

## 2019-07-16 DIAGNOSIS — I10 Essential (primary) hypertension: Secondary | ICD-10-CM

## 2019-07-16 DIAGNOSIS — I5022 Chronic systolic (congestive) heart failure: Secondary | ICD-10-CM

## 2019-07-16 DIAGNOSIS — I2510 Atherosclerotic heart disease of native coronary artery without angina pectoris: Secondary | ICD-10-CM

## 2019-07-16 DIAGNOSIS — E785 Hyperlipidemia, unspecified: Secondary | ICD-10-CM

## 2019-07-16 MED ORDER — ASPIRIN 81 MG PO CHEW: 81.0000 mg | CHEWABLE_TABLET | Freq: Every day | ORAL | 3 refills | Status: DC

## 2019-07-16 MED ORDER — CARVEDILOL 3.125 MG PO TABS: 3.1250 mg | ORAL_TABLET | Freq: Two times a day (BID) | ORAL | 3 refills | Status: DC

## 2019-07-16 MED ORDER — NITROGLYCERIN 0.4 MG SL SUBL: 0.4000 mg | SUBLINGUAL_TABLET | SUBLINGUAL | 3 refills | Status: DC | PRN

## 2019-07-16 MED ORDER — POTASSIUM CHLORIDE ER 10 MEQ PO TBCR: 10.0000 meq | EXTENDED_RELEASE_TABLET | Freq: Every day | ORAL | 3 refills | Status: DC

## 2019-07-16 MED ORDER — FUROSEMIDE 40 MG PO TABS: 40.0000 mg | ORAL_TABLET | Freq: Every day | ORAL | 3 refills | Status: DC

## 2019-07-16 MED ORDER — SPIRONOLACTONE 25 MG PO TABS: 25.0000 mg | ORAL_TABLET | Freq: Every day | ORAL | 3 refills | Status: DC

## 2019-07-16 MED ORDER — LOSARTAN POTASSIUM 50 MG PO TABS: 50.0000 mg | ORAL_TABLET | Freq: Every day | ORAL | 3 refills | Status: DC

## 2019-07-16 MED ORDER — ATORVASTATIN CALCIUM 80 MG PO TABS: 80.0000 mg | ORAL_TABLET | Freq: Every day | ORAL | 3 refills | Status: DC

## 2019-07-16 NOTE — Progress Notes (Signed)
Cardiology Office Note   Date:  07/16/2019   ID:  Lindsey Gibson, DOB 14-Aug-1960, MRN [id number]  PCP:  [name], MD  Cardiologist:   Lorine Bears, MD   Chief Complaint  Patient presents with  . OTHER    12 month f/u c/o medications making her nauseas. Meds reviewed verbally with pt.      History of Present Illness: Lindsey Gibson is a 59 y.o. female who presents for a follow-up visit regarding coronary artery disease and chronic systolic heart failure.  Other medical problems include hyperlipidemia, obesity and COPD. She was hospitalized in December, 2018 with pulmonary edema and non-ST elevation myocardial infarction.  Echocardiogram showed an EF of 20-25%.  Cardiac catheterization showed severe mid LAD disease which was treated with PCI and drug-eluting stent placement.  She did not tolerate Entresto due to hypotension and recurrent chest pain.   She underwent a repeat echocardiogram in April of 2019 which showed improvement in EF to 50-55%.  There was evidence of grade 2 diastolic dysfunction. She has been doing well with no recent chest pain, shortness of breath or palpitations.  She does complain of some nausea in the morning after she takes her medications. I reviewed her labs done in May which were unremarkable.   Past Medical History:  Diagnosis Date  . Asthma   . CAD (coronary artery disease)    a. 12/2016 NSTEMI/PCI: LM nl, LAD 30ost, 52m (IVUS->severe plaque burden->3.0 x 18 Sierra DES), D1/2/3 nl, LCX 30p, OM1/2/3 nl, RCA nl, RPDA/RPAV/RPL1/2 nl.  . COPD (chronic obstructive pulmonary disease) ([facility])   . Hyperlipidemia   . Hypertension   . Ischemic cardiomyopathy    a. 12/2016 Echo: EF 20-25%, mid-apicalanteroseptal, ant, and apical AK, GR1 DD, triv AI, mildly dil LA; b. 04/2017 Echo: EF 50-55%, Gr2 DD, Nl RV fxn.  . Morbid obesity ([facility])   . Myocardial infarction [facility])     Past Surgical History:  Procedure Laterality Date  . CORONARY STENT  INTERVENTION N/A 12/27/2016   Procedure: CORONARY STENT INTERVENTION;  Surgeon: Iran Ouch, MD;  Location: [facility];  Service: Cardiovascular;  Laterality: N/A;  . INTRAVASCULAR ULTRASOUND/IVUS N/A 12/27/2016   Procedure: Intravascular Ultrasound/IVUS;  Surgeon: Iran Ouch, MD;  Location: [facility];  Service: Cardiovascular;  Laterality: N/A;  . KNEE ARTHROSCOPY Right   . LEFT HEART CATH AND CORONARY ANGIOGRAPHY N/A 12/27/2016   Procedure: LEFT HEART CATH AND CORONARY ANGIOGRAPHY;  Surgeon: Iran Ouch, MD;  Location: [facility];  Service: Cardiovascular;  Laterality: N/A;  . SHOULDER ARTHROSCOPY WITH SUBACROMIAL DECOMPRESSION Right 11/22/2015   Procedure: SHOULDER ARTHROSCOPY WITH DEBRIDEMENT, DECOMPRESSION  AND BICEPS TENODESIS;  Surgeon: Christena Flake, MD;  Location: [facility];  Service: Orthopedics;  Laterality: Right;  . TONSILLECTOMY    . ULNAR NERVE TRANSPOSITION Right 11/22/2015   Procedure: ULNAR NERVE DECOMPRESSION/TRANSPOSITION;  Surgeon: Christena Flake, MD;  Location: [facility];  Service: Orthopedics;  Laterality: Right;     Current Outpatient Medications  Medication Sig Dispense Refill  . aspirin 81 MG chewable tablet Chew 1 tablet (81 mg total) by mouth daily. 90 tablet 3  . atorvastatin (LIPITOR) 80 MG tablet TAKE ONE TABLET BY MOUTH EVERY DAY 90 tablet 0  . carvedilol (COREG) 3.125 MG tablet TAKE ONE TABLET BY MOUTH 2 TIMES A DAY WITH A MEAL. 180 tablet 0  . cetirizine (ZYRTEC) 10 MG tablet TAKE ONE TABLET BY MOUTH EVERY DAY. 90  tablet 0  . furosemide (LASIX) 40 MG tablet TAKE ONE TABLET BY MOUTH EVERY DAY 90 tablet 0  . losartan (COZAAR) 50 MG tablet TAKE ONE TABLET BY MOUTH EVERY DAY 90 tablet 0  . Multiple Vitamin (MULTIVITAMIN WITH MINERALS) TABS tablet Take 1 tablet by mouth daily.    . nitroGLYCERIN (NITROSTAT) 0.4 MG SL tablet Place 1 tablet (0.4 mg total) under the tongue every 5 (five) minutes as needed for chest pain.  25 tablet 3  . potassium chloride (K-DUR) 10 MEQ tablet Take 1 tablet (10 mEq total) by mouth daily. 90 tablet 3  . spironolactone (ALDACTONE) 25 MG tablet TAKE ONE TABLET BY MOUTH EVERY DAY 90 tablet 0  . VENTOLIN HFA 108 (90 Base) MCG/ACT inhaler INHALE 1 PUFF INTO THE LUNGS EVERY 6 HOURS AS NEEDED FOR WHEEZING OR SHORTNESS OF BREATH 54 g 0   No current facility-administered medications for this visit.    Allergies:   Penicillins    Social History:  The patient  reports that she has quit smoking. Her smoking use included cigarettes. She has a 18.00 pack-year smoking history. She has never used smokeless tobacco. She reports that she does not drink alcohol and does not use drugs.   Family History:  The patient's family history includes Diabetes in her mother and sister; Heart disease in her mother.    ROS:  Please see the history of present illness.   Otherwise, review of systems are positive for none.   All other systems are reviewed and negative.    PHYSICAL EXAM: VS:  BP 130/90 (BP Location: Left Arm, Patient Position: Sitting, Cuff Size: Normal)   Pulse 79   Ht 5\' 11"  (1.803 m)   Wt 253 lb (114.8 kg)   SpO2 99%   BMI 35.29 kg/m  , BMI Body mass index is 35.29 kg/m. GEN: Well nourished, well developed, in no acute distress  HEENT: normal  Neck: no JVD, carotid bruits, or masses Cardiac: RRR; no murmurs, rubs, or gallops, trace bilateral edema  Respiratory:  clear to auscultation bilaterally, normal work of breathing GI: soft, nontender, nondistended, + BS MS: no deformity or atrophy  Skin: warm and dry, no rash Neuro:  Strength and sensation are intact Psych: euthymic mood, full affect   EKG:  EKG is ordered today. EKG showed normal sinus rhythm with no significant ST or T wave changes.   Recent Labs: 05/27/2019: ALT 24; BUN 15; Creatinine, Ser 0.82; Hemoglobin 12.3; Platelets 227; Potassium 4.3; Sodium 141; TSH 0.751    Lipid Panel    Component Value Date/Time    CHOL 133 05/27/2019 0940   TRIG 147 05/27/2019 0940   HDL 47 05/27/2019 0940   CHOLHDL 2.8 05/27/2019 0940   CHOLHDL 4.7 12/28/2016 0642   VLDL 24 12/28/2016 0642   LDLCALC 61 05/27/2019 0940      Wt Readings from Last 3 Encounters:  07/16/19 253 lb (114.8 kg)  05/27/19 253 lb 3.2 oz (114.9 kg)  07/17/18 241 lb 12 oz (109.7 kg)       No flowsheet data found.    ASSESSMENT AND PLAN:  1.  Coronary artery disease involving native coronary arteries without angina: Continue aspirin indefinitely.  2.  Chronic systolic heart failure: Ejection fraction improved to 50-55%.  She appears to be euvolemic on current dose of furosemide 40 mg once daily.   She is otherwise on optimal medical therapy for heart failure.  I reviewed her labs done in May which showed normal  renal function and electrolytes.  3.  Hyperlipidemia: Continue high-dose atorvastatin.  Most recent LDL was 61.  4.  Essential hypertension: Blood pressure is reasonably controlled on current medications.    Disposition:   FU with me in 12 months  Signed,  Lorine Bears, MD  07/16/2019 1:55 PM    [facility]

## 2019-07-16 NOTE — Patient Instructions (Signed)
Medication Instructions:  No changes  *If you need a refill on your cardiac medications before your next appointment, please call your pharmacy*   Lab Work: None  If you have labs (blood work) drawn today and your tests are completely normal, you will receive your results only by: . MyChart Message (if you have MyChart) OR . A paper copy in the mail If you have any lab test that is abnormal or we need to change your treatment, we will call you to review the results.   Testing/Procedures: None   Follow-Up: At [facility], you and your health needs are our priority.  As part of our continuing mission to provide you with exceptional heart care, we have created designated Provider Care Teams.  These Care Teams include your primary Cardiologist (physician) and Advanced Practice Providers (APPs -  Physician Assistants and Nurse Practitioners) who all work together to provide you with the care you need, when you need it.  We recommend signing up for the patient portal called "MyChart".  Sign up information is provided on this After Visit Summary.  MyChart is used to connect with patients for Virtual Visits (Telemedicine).  Patients are able to view lab/test results, encounter notes, upcoming appointments, etc.  Non-urgent messages can be sent to your provider as well.   To learn more about what you can do with MyChart, go to [email or web address].    Your next appointment:   1 year(s)  The format for your next appointment:   In Person  Provider:    You may see Muhammad Arida, MD or one of the following Advanced Practice Providers on your designated Care Team:    Christopher Berge, NP  Ryan Dunn, PA-C  Jacquelyn Visser, PA-C  

## 2019-08-04 ENCOUNTER — Ambulatory Visit: Payer: Self-pay | Admitting: Cardiovascular Disease

## 2019-08-05 ENCOUNTER — Other Ambulatory Visit: Payer: Self-pay

## 2019-08-05 VITALS — BP 134/75 | HR 81 | Ht 71.0 in | Wt 248.0 lb

## 2019-08-05 DIAGNOSIS — I10 Essential (primary) hypertension: Secondary | ICD-10-CM

## 2019-08-05 NOTE — Progress Notes (Signed)
[facility]

## 2019-08-06 ENCOUNTER — Other Ambulatory Visit: Payer: Self-pay | Admitting: Gerontology

## 2019-08-06 DIAGNOSIS — N390 Urinary tract infection, site not specified: Secondary | ICD-10-CM

## 2019-08-06 LAB — CBC WITH DIFFERENTIAL/PLATELET
Basophils Absolute: 0 10*3/uL (ref 0.0–0.2)
Basos: 1 %
EOS (ABSOLUTE): 0.2 10*3/uL (ref 0.0–0.4)
Eos: 3 %
Hematocrit: 38.3 % (ref 34.0–46.6)
Hemoglobin: 12.5 g/dL (ref 11.1–15.9)
Immature Grans (Abs): 0 10*3/uL (ref 0.0–0.1)
Immature Granulocytes: 0 %
Lymphocytes Absolute: 2.3 10*3/uL (ref 0.7–3.1)
Lymphs: 39 %
MCH: 26.8 pg (ref 26.6–33.0)
MCHC: 32.6 g/dL (ref 31.5–35.7)
MCV: 82 fL (ref 79–97)
Monocytes Absolute: 0.4 10*3/uL (ref 0.1–0.9)
Monocytes: 7 %
Neutrophils Absolute: 2.9 10*3/uL (ref 1.4–7.0)
Neutrophils: 50 %
Platelets: 243 10*3/uL (ref 150–450)
RBC: 4.66 x10E6/uL (ref 3.77–5.28)
RDW: 14.2 % (ref 11.7–15.4)
WBC: 5.8 10*3/uL (ref 3.4–10.8)

## 2019-08-06 LAB — COMPREHENSIVE METABOLIC PANEL
ALT: 19 IU/L (ref 0–32)
AST: 20 IU/L (ref 0–40)
Albumin/Globulin Ratio: 1.8 (ref 1.2–2.2)
Albumin: 4.6 g/dL (ref 3.8–4.9)
Alkaline Phosphatase: 98 IU/L (ref 48–121)
BUN/Creatinine Ratio: 16 (ref 12–28)
BUN: 14 mg/dL (ref 8–27)
Bilirubin Total: 1.5 mg/dL — ABNORMAL HIGH (ref 0.0–1.2)
CO2: 24 mmol/L (ref 20–29)
Calcium: 9.6 mg/dL (ref 8.7–10.3)
Chloride: 103 mmol/L (ref 96–106)
Creatinine, Ser: 0.89 mg/dL (ref 0.57–1.00)
GFR calc Af Amer: 81 mL/min/{1.73_m2} (ref >59)
GFR calc non Af Amer: 71 mL/min/{1.73_m2} (ref >59)
Globulin, Total: 2.5 g/dL (ref 1.5–4.5)
Glucose: 99 mg/dL (ref 65–99)
Potassium: 4.3 mmol/L (ref 3.5–5.2)
Sodium: 139 mmol/L (ref 134–144)
Total Protein: 7.1 g/dL (ref 6.0–8.5)

## 2019-08-06 LAB — LIPID PANEL
Chol/HDL Ratio: 3.6 ratio (ref 0.0–4.4)
Cholesterol, Total: 140 mg/dL (ref 100–199)
HDL: 39 mg/dL — ABNORMAL LOW (ref >39)
LDL Chol Calc (NIH): 70 mg/dL (ref 0–99)
Triglycerides: 183 mg/dL — ABNORMAL HIGH (ref 0–149)
VLDL Cholesterol Cal: 31 mg/dL (ref 5–40)

## 2019-08-06 LAB — TSH: TSH: 0.731 u[IU]/mL (ref 0.450–4.500)

## 2019-08-06 MED ORDER — NITROFURANTOIN MONOHYD MACRO 100 MG PO CAPS: 100.0000 mg | ORAL_CAPSULE | Freq: Two times a day (BID) | ORAL | 0 refills | Status: DC

## 2019-08-09 LAB — UA/M W/RFLX CULTURE, ROUTINE
Bilirubin, UA: NEGATIVE
Glucose, UA: NEGATIVE
Ketones, UA: NEGATIVE
Nitrite, UA: POSITIVE — AB
Specific Gravity, UA: 1.022 (ref 1.005–1.030)
Urobilinogen, Ur: 0.2 mg/dL (ref 0.2–1.0)
pH, UA: 5.5 (ref 5.0–7.5)

## 2019-08-09 LAB — MICROSCOPIC EXAMINATION
Casts: NONE SEEN /lpf
Epithelial Cells (non renal): >10 /hpf — AB (ref 0–10)
WBC, UA: >30 /hpf — AB (ref 0–5)

## 2019-08-09 LAB — URINE CULTURE, REFLEX

## 2019-08-19 ENCOUNTER — Telehealth: Payer: Self-pay | Admitting: Pharmacist

## 2019-08-19 NOTE — Telephone Encounter (Signed)
08/19/2019 11:47:36 AM - Ventolin up for renewal-to pt & dr  -- Rhetta Mura - Wednesday, August 19, 2019 11:45 AM --Patient enrollment for Ventolin/[employer] ended 07/06/2019. Previously mailed paperwork to patient 05/20/2019 to sign and return, have not received back--remailing to patient to sign [employer] application and return with updated POI. I am also sending script to [facility] for provider to sign & return.

## 2019-09-17 ENCOUNTER — Telehealth: Payer: Self-pay | Admitting: Pharmacist

## 2019-09-17 NOTE — Telephone Encounter (Signed)
09/17/2019 11:11:54 AM - Ventolin HFA Unable to order  -- Rhetta Mura - Thursday, September 17, 2019 11:08 AM --I have closed the Ventolin HFA-Patient enrollment with [employer] ended 07/06/2019--I have mailed patient her portion of [employer] application to sign and return 2 times May 20, 2019 & August 19, 2019--Patient has Not returned-therefore unable to renew enrollment. Also this medication will be taken off PAP in Jan. 2022. Copy of note in [facility] for providers to see.

## 2019-11-04 ENCOUNTER — Other Ambulatory Visit: Payer: Self-pay

## 2019-11-04 DIAGNOSIS — N390 Urinary tract infection, site not specified: Secondary | ICD-10-CM

## 2019-11-11 ENCOUNTER — Encounter: Payer: Self-pay | Admitting: Internal Medicine

## 2019-11-11 ENCOUNTER — Other Ambulatory Visit: Payer: Self-pay

## 2019-11-11 ENCOUNTER — Ambulatory Visit: Payer: Self-pay | Admitting: Internal Medicine

## 2019-11-11 DIAGNOSIS — I214 Non-ST elevation (NSTEMI) myocardial infarction: Secondary | ICD-10-CM

## 2019-11-11 DIAGNOSIS — J449 Chronic obstructive pulmonary disease, unspecified: Secondary | ICD-10-CM

## 2019-11-11 DIAGNOSIS — R8290 Unspecified abnormal findings in urine: Secondary | ICD-10-CM | POA: Insufficient documentation

## 2019-11-11 LAB — UA/M W/RFLX CULTURE, ROUTINE
Bilirubin, UA: NEGATIVE
Glucose, UA: NEGATIVE
Ketones, UA: NEGATIVE
Nitrite, UA: NEGATIVE
Protein,UA: NEGATIVE
RBC, UA: NEGATIVE
Specific Gravity, UA: 1.006 (ref 1.005–1.030)
Urobilinogen, Ur: 0.2 mg/dL (ref 0.2–1.0)
pH, UA: 7 (ref 5.0–7.5)

## 2019-11-11 LAB — URINE CULTURE, REFLEX

## 2019-11-11 LAB — MICROSCOPIC EXAMINATION
Casts: NONE SEEN /lpf
Epithelial Cells (non renal): NONE SEEN /hpf (ref 0–10)
RBC, Urine: NONE SEEN /hpf (ref 0–2)

## 2019-11-11 NOTE — Progress Notes (Signed)
Established Patient Office Visit  Subjective:  Patient ID: Lindsey Gibson, female    DOB: [birth year]  Age: 59 y.o. MRN: [id number]  CC:  Chief Complaint  Patient presents with  . Follow-up    [name] presents for FU. Pt reports doing well, having no odd symptoms.   Past Medical History:  Diagnosis Date  . Asthma   . CAD (coronary artery disease)    a. 12/2016 NSTEMI/PCI: LM nl, LAD 30ost, 53m (IVUS->severe plaque burden->3.0 x 18 Sierra DES), D1/2/3 nl, LCX 30p, OM1/2/3 nl, RCA nl, RPDA/RPAV/RPL1/2 nl.  . COPD (chronic obstructive pulmonary disease) (HCC)   . Hyperlipidemia   . Hypertension   . Ischemic cardiomyopathy    a. 12/2016 Echo: EF 20-25%, mid-apicalanteroseptal, ant, and apical AK, GR1 DD, triv AI, mildly dil LA; b. 04/2017 Echo: EF 50-55%, Gr2 DD, Nl RV fxn.  . Morbid obesity (HCC)   . Myocardial infarction [facility])     Past Surgical History:  Procedure Laterality Date  . CORONARY STENT INTERVENTION N/A 12/27/2016   Procedure: CORONARY STENT INTERVENTION;  Surgeon: Iran Ouch, MD;  Location: [facility] INVASIVE CV LAB;  Service: Cardiovascular;  Laterality: N/A;  . INTRAVASCULAR ULTRASOUND/IVUS N/A 12/27/2016   Procedure: Intravascular Ultrasound/IVUS;  Surgeon: Iran Ouch, MD;  Location: [facility] INVASIVE CV LAB;  Service: Cardiovascular;  Laterality: N/A;  . KNEE ARTHROSCOPY Right   . LEFT HEART CATH AND CORONARY ANGIOGRAPHY N/A 12/27/2016   Procedure: LEFT HEART CATH AND CORONARY ANGIOGRAPHY;  Surgeon: Iran Ouch, MD;  Location: [facility] CV LAB;  Service: Cardiovascular;  Laterality: N/A;  . SHOULDER ARTHROSCOPY WITH SUBACROMIAL DECOMPRESSION Right 11/22/2015   Procedure: SHOULDER ARTHROSCOPY WITH DEBRIDEMENT, DECOMPRESSION  AND BICEPS TENODESIS;  Surgeon: Christena Flake, MD;  Location: [facility] ORS;  Service: Orthopedics;  Laterality: Right;  . TONSILLECTOMY    . ULNAR NERVE TRANSPOSITION Right 11/22/2015   Procedure: ULNAR NERVE  DECOMPRESSION/TRANSPOSITION;  Surgeon: Christena Flake, MD;  Location: [facility] ORS;  Service: Orthopedics;  Laterality: Right;    Family History  Problem Relation Age of Onset  . Heart disease Mother   . Diabetes Mother   . Diabetes Sister     Social History   Socioeconomic History  . Marital status: Married    Spouse name: Not on file  . Number of children: Not on file  . Years of education: Not on file  . Highest education level: Not on file  Occupational History  . Occupation: none  Tobacco Use  . Smoking status: Former Smoker    Packs/day: 1.00    Years: 18.00    Pack years: 18.00    Types: Cigarettes  . Smokeless tobacco: Never Used  Vaping Use  . Vaping Use: Never used  Substance and Sexual Activity  . Alcohol use: No  . Drug use: No  . Sexual activity: Not on file  Other Topics Concern  . Not on file  Social History Narrative   Rents with family. Not on food stamps.   Social Determinants of Health   Financial Resource Strain:   . Difficulty of Paying Living Expenses: Not on file  Food Insecurity:   . Worried About Programme researcher, broadcasting/film/video in the Last Year: Not on file  . Ran Out of Food in the Last Year: Not on file  Transportation Needs:   . Lack of Transportation (Medical): Not on file  . Lack of Transportation (Non-Medical): Not on file  Physical Activity:   .  Days of Exercise per Week: Not on file  . Minutes of Exercise per Session: Not on file  Stress:   . Feeling of Stress : Not on file  Social Connections:   . Frequency of Communication with Friends and Family: Not on file  . Frequency of Social Gatherings with Friends and Family: Not on file  . Attends Religious Services: Not on file  . Active Member of Clubs or Organizations: Not on file  . Attends Banker Meetings: Not on file  . Marital Status: Not on file  Intimate Partner Violence:   . Fear of Current or Ex-Partner: Not on file  . Emotionally Abused: Not on file  . Physically  Abused: Not on file  . Sexually Abused: Not on file    Outpatient Medications Prior to Visit  Medication Sig Dispense Refill  . aspirin 81 MG chewable tablet Chew 1 tablet (81 mg total) by mouth daily. 90 tablet 3  . atorvastatin (LIPITOR) 80 MG tablet Take 1 tablet (80 mg total) by mouth daily. 90 tablet 3  . carvedilol (COREG) 3.125 MG tablet Take 1 tablet (3.125 mg total) by mouth 2 (two) times daily with a meal. 180 tablet 3  . cetirizine (ZYRTEC) 10 MG tablet TAKE ONE TABLET BY MOUTH EVERY DAY. 90 tablet 0  . furosemide (LASIX) 40 MG tablet Take 1 tablet (40 mg total) by mouth daily. 90 tablet 3  . losartan (COZAAR) 50 MG tablet Take 1 tablet (50 mg total) by mouth daily. 90 tablet 3  . Multiple Vitamin (MULTIVITAMIN WITH MINERALS) TABS tablet Take 1 tablet by mouth daily.    . nitrofurantoin, macrocrystal-monohydrate, (MACROBID) 100 MG capsule Take 1 capsule (100 mg total) by mouth 2 (two) times daily. 14 capsule 0  . nitroGLYCERIN (NITROSTAT) 0.4 MG SL tablet Place 1 tablet (0.4 mg total) under the tongue every 5 (five) minutes as needed for chest pain. 25 tablet 3  . potassium chloride (KLOR-CON) 10 MEQ tablet Take 1 tablet (10 mEq total) by mouth daily. 90 tablet 3  . spironolactone (ALDACTONE) 25 MG tablet Take 1 tablet (25 mg total) by mouth daily. 90 tablet 3  . VENTOLIN HFA 108 (90 Base) MCG/ACT inhaler INHALE 1 PUFF INTO THE LUNGS EVERY 6 HOURS AS NEEDED FOR WHEEZING OR SHORTNESS OF BREATH 54 g 0   No facility-administered medications prior to visit.    Allergies  Allergen Reactions  . Penicillins Anaphylaxis    Has patient had a PCN reaction causing immediate rash, facial/tongue/throat swelling, SOB If all of the above answers are "NO", then may proceed with Cephalosporin use.   or lightheadedness with hypotension:unsure Has patient had a PCN reaction causing severe rash involving mucus membranes or skin necrosis:unsure Has patient had a PCN reaction that required  hospitalization:unsure Has patient had a PCN reaction occurring within the last 10 years:unsure  Childhood reaction    ROS Review of Systems    Objective:    Physical Exam  There were no vitals taken for this visit. Wt Readings from Last 3 Encounters:  08/06/19 248 lb (112.5 kg)  07/16/19 253 lb (114.8 kg)  05/27/19 253 lb 3.2 oz (114.9 kg)     Health Maintenance Due  Topic Date Due  . Hepatitis C Screening  Never done  . COVID-19 Vaccine (1) Never done  . TETANUS/TDAP  Never done  . PAP SMEAR-Modifier  Never done  . MAMMOGRAM  Never done  . COLONOSCOPY  Never done  . INFLUENZA VACCINE  Never done    There are no preventive care reminders to display for this patient.  Lab Results  Component Value Date   TSH 0.731 08/05/2019   Lab Results  Component Value Date   WBC 5.8 08/05/2019   HGB 12.5 08/05/2019   HCT 38.3 08/05/2019   MCV 82 08/05/2019   PLT 243 08/05/2019   Lab Results  Component Value Date   NA 139 08/05/2019   K 4.3 08/05/2019   CO2 24 08/05/2019   GLUCOSE 99 08/05/2019   BUN 14 08/05/2019   CREATININE 0.89 08/05/2019   BILITOT 1.5 (H) 08/05/2019   ALKPHOS 98 08/05/2019   AST 20 08/05/2019   ALT 19 08/05/2019   PROT 7.1 08/05/2019   ALBUMIN 4.6 08/05/2019   CALCIUM 9.6 08/05/2019   ANIONGAP 8 04/16/2017   Lab Results  Component Value Date   CHOL 140 08/05/2019   Lab Results  Component Value Date   HDL 39 (L) 08/05/2019   Lab Results  Component Value Date   LDLCALC 70 08/05/2019   Lab Results  Component Value Date   TRIG 183 (H) 08/05/2019   Lab Results  Component Value Date   CHOLHDL 3.6 08/05/2019   No results found for: HGBA1C    Assessment & Plan:   Problem List Items Addressed This Visit      Cardiovascular and Mediastinum   NSTEMI (non-ST elevated myocardial infarction) (HCC)     Other   Morbid obesity (HCC) - Primary      No orders of the defined types were placed in this encounter.   1. NSTEMI  (non-ST elevated myocardial infarction) [facility]) Last follow up with cardiologists in July, felt to be stable. No medication changes. Has a fu in 33yr.   2. Abnormal urinalysis Had abnormal urine culture done in August 2021, though asymptomatic, was prescribed an antibiotic course of Macrobid. Had fu urinalysis and culutre in November 2021, remains asymptomatic and prefers not to start up on any antibiotics.   3. Chronic obstructive pulmonary disease, unspecified COPD type (HCC) Stable. No medication changes.  Plan to follow up with urine analysis and labs in 58mo prior to her cardiologists appt. Pt will fu if she develops symtptoms of bladder infection.    Follow-up: No follow-ups on file.    Lindsey Gibson

## 2020-02-01 ENCOUNTER — Other Ambulatory Visit: Payer: Self-pay

## 2020-02-01 NOTE — Progress Notes (Deleted)
[facility] Visit Note  Patient: Lindsey Gibson MRN: [id number] Date of Birth: 1960-06-06 PCP: [name], MD   Lindsey Gibson 60 y.o. female presents for a telephone visit for medication management today. Verified patient with two identifiers.   There were no vitals taken for this visit.  Patient Information   Past Medical History:  Diagnosis Date  . Asthma   . CAD (coronary artery disease)    a. 12/2016 NSTEMI/PCI: LM nl, LAD 30ost, 15m (IVUS->severe plaque burden->3.0 x 18 Sierra DES), D1/2/3 nl, LCX 30p, OM1/2/3 nl, RCA nl, RPDA/RPAV/RPL1/2 nl.  . COPD (chronic obstructive pulmonary disease) (HCC)   . Hyperlipidemia   . Hypertension   . Ischemic cardiomyopathy    a. 12/2016 Echo: EF 20-25%, mid-apicalanteroseptal, ant, and apical AK, GR1 DD, triv AI, mildly dil LA; b. 04/2017 Echo: EF 50-55%, Gr2 DD, Nl RV fxn.  . Morbid obesity (HCC)   . Myocardial infarction [facility])       Past Surgical History:  Procedure Laterality Date  . CORONARY STENT INTERVENTION N/A 12/27/2016   Procedure: CORONARY STENT INTERVENTION;  Surgeon: Iran Ouch, MD;  Location: [facility] CV LAB;  Service: Cardiovascular;  Laterality: N/A;  . INTRAVASCULAR ULTRASOUND/IVUS N/A 12/27/2016   Procedure: Intravascular Ultrasound/IVUS;  Surgeon: Iran Ouch, MD;  Location: [facility];  Service: Cardiovascular;  Laterality: N/A;  . KNEE ARTHROSCOPY Right   . LEFT HEART CATH AND CORONARY ANGIOGRAPHY N/A 12/27/2016   Procedure: LEFT HEART CATH AND CORONARY ANGIOGRAPHY;  Surgeon: Iran Ouch, MD;  Location: [facility];  Service: Cardiovascular;  Laterality: N/A;  . SHOULDER ARTHROSCOPY WITH SUBACROMIAL DECOMPRESSION Right 11/22/2015   Procedure: SHOULDER ARTHROSCOPY WITH DEBRIDEMENT, DECOMPRESSION  AND BICEPS TENODESIS;  Surgeon: Christena Flake, MD;  Location: [facility] OR[facility];  Service: Orthopedics;  Laterality: Right;  . TONSILLECTOMY    . ULNAR NERVE  TRANSPOSITION Right 11/22/2015   Procedure: ULNAR NERVE DECOMPRESSION/TRANSPOSITION;  Surgeon: Christena Flake, MD;  Location: [facility] ORS;  Service: Orthopedics;  Laterality: Right;     Family History  Problem Relation Age of Onset  . Heart disease Mother   . Diabetes Mother   . Diabetes Sister     New Diagnoses (since last visit):   Family Support: {Family Support:210800003}  Lifestyle Diet: Breakfast:*** Lunch:*** Dinner:*** Drinks:***            Social History   Substance and Sexual Activity  Alcohol Use No      Social History   Tobacco Use  Smoking Status Former Smoker  . Packs/day: 1.00  . Years: 18.00  . Pack years: 18.00  . Types: Cigarettes  Smokeless Tobacco Never Used      Health Maintenance  Topic Date Due  . Hepatitis C Screening  Never done  . COVID-19 Vaccine (1) Never done  . TETANUS/TDAP  Never done  . PAP SMEAR-Modifier  Never done  . COLONOSCOPY (Pts 45-36yrs Insurance coverage will need to be confirmed)  Never done  . MAMMOGRAM  Never done  . INFLUENZA VACCINE  Never done  . HIV Screening  Completed   Health Maintenance/Date Completed  Last ED visit: *** Last Visit to PCP: *** Next Visit to PCP: *** Specialist Visit: *** Dental Exam: *** Eye Exam: *** Pelvic/PAP Exam: *** Mammogram: *** Colonoscopy: *** Flu Vaccine: *** Pneumonia Vaccine: *** COVID-19 Vaccine: *** Shingrix Vaccine: ***   Assessment and Plan: CHF/Hx NSTEMI  HTN  HLD 08/05/19 lipid panel - TG 183,  HLD 39, all others WNL  COPD  Access/Adherence   Raiford Noble, PharmD Pharmacy Resident  02/01/2020 12:21 PM

## 2020-03-15 ENCOUNTER — Telehealth: Payer: Self-pay | Admitting: Pharmacy Technician

## 2020-03-15 NOTE — Telephone Encounter (Signed)
Received updated proof of income.  Patient eligible to receive medication assistance at [facility] until time for re-certification in 1610, and as long as eligibility requirements continue to be met.  [facility]

## 2020-04-09 MED FILL — Carvedilol Tab 3.125 MG: ORAL | 90 days supply | Qty: 180 | Fill #0 | Status: AC

## 2020-04-09 MED FILL — Spironolactone Tab 25 MG: ORAL | 30 days supply | Qty: 30 | Fill #0 | Status: AC

## 2020-04-09 MED FILL — Atorvastatin Calcium Tab 80 MG (Base Equivalent): ORAL | 90 days supply | Qty: 90 | Fill #0 | Status: AC

## 2020-04-11 ENCOUNTER — Other Ambulatory Visit: Payer: Self-pay

## 2020-04-12 ENCOUNTER — Other Ambulatory Visit: Payer: Self-pay

## 2020-05-19 ENCOUNTER — Other Ambulatory Visit: Payer: Self-pay

## 2020-05-26 ENCOUNTER — Other Ambulatory Visit: Payer: Self-pay

## 2020-05-26 MED FILL — Spironolactone Tab 25 MG: ORAL | 30 days supply | Qty: 30 | Fill #1 | Status: AC

## 2020-06-13 ENCOUNTER — Other Ambulatory Visit: Payer: Self-pay

## 2020-06-13 MED FILL — Aspirin Chew Tab 81 MG: ORAL | 90 days supply | Qty: 90 | Fill #0 | Status: AC

## 2020-06-13 MED FILL — Potassium Chloride Tab ER 10 mEq: ORAL | 90 days supply | Qty: 90 | Fill #0 | Status: AC

## 2020-06-13 MED FILL — Albuterol Sulfate Inhal Aero 108 MCG/ACT (90MCG Base Equiv): RESPIRATORY_TRACT | 50 days supply | Qty: 6.7 | Fill #0 | Status: AC

## 2020-06-13 MED FILL — Cetirizine HCl Tab 10 MG: ORAL | 90 days supply | Qty: 90 | Fill #0 | Status: AC

## 2020-06-16 ENCOUNTER — Other Ambulatory Visit: Payer: Self-pay

## 2020-06-16 MED FILL — Spironolactone Tab 25 MG: ORAL | 30 days supply | Qty: 30 | Fill #2 | Status: CN

## 2020-06-16 MED FILL — Furosemide Tab 40 MG: ORAL | 90 days supply | Qty: 90 | Fill #0 | Status: AC

## 2020-06-16 MED FILL — Losartan Potassium Tab 50 MG: ORAL | 90 days supply | Qty: 90 | Fill #0 | Status: AC

## 2020-06-17 ENCOUNTER — Other Ambulatory Visit: Payer: Self-pay

## 2020-06-26 MED FILL — Spironolactone Tab 25 MG: ORAL | 30 days supply | Qty: 30 | Fill #2 | Status: CN

## 2020-06-27 ENCOUNTER — Other Ambulatory Visit: Payer: Self-pay

## 2020-06-29 ENCOUNTER — Other Ambulatory Visit: Payer: Self-pay

## 2020-06-29 MED FILL — Spironolactone Tab 25 MG: ORAL | 20 days supply | Qty: 20 | Fill #2 | Status: AC

## 2020-06-30 ENCOUNTER — Other Ambulatory Visit: Payer: Self-pay

## 2020-07-01 ENCOUNTER — Other Ambulatory Visit: Payer: Self-pay | Admitting: Cardiovascular Disease

## 2020-07-01 LAB — URINALYSIS, ROUTINE W REFLEX MICROSCOPIC
Bilirubin, UA: NEGATIVE
Glucose, UA: NEGATIVE
Ketones, UA: NEGATIVE
Nitrite, UA: NEGATIVE
Protein,UA: NEGATIVE
RBC, UA: NEGATIVE
Specific Gravity, UA: 1.006 (ref 1.005–1.030)
Urobilinogen, Ur: 0.2 mg/dL (ref 0.2–1.0)
pH, UA: 7 (ref 5.0–7.5)

## 2020-07-01 LAB — CBC WITH DIFFERENTIAL/PLATELET
Basophils Absolute: 0 10*3/uL (ref 0.0–0.2)
Basos: 0 %
EOS (ABSOLUTE): 0.1 10*3/uL (ref 0.0–0.4)
Eos: 2 %
Hematocrit: 40.5 % (ref 34.0–46.6)
Hemoglobin: 12.9 g/dL (ref 11.1–15.9)
Immature Grans (Abs): 0 10*3/uL (ref 0.0–0.1)
Immature Granulocytes: 0 %
Lymphocytes Absolute: 2.5 10*3/uL (ref 0.7–3.1)
Lymphs: 39 %
MCH: 27 pg (ref 26.6–33.0)
MCHC: 31.9 g/dL (ref 31.5–35.7)
MCV: 85 fL (ref 79–97)
Monocytes Absolute: 0.4 10*3/uL (ref 0.1–0.9)
Monocytes: 6 %
Neutrophils Absolute: 3.3 10*3/uL (ref 1.4–7.0)
Neutrophils: 53 %
Platelets: 236 10*3/uL (ref 150–450)
RBC: 4.77 x10E6/uL (ref 3.77–5.28)
RDW: 13.9 % (ref 11.7–15.4)
WBC: 6.3 10*3/uL (ref 3.4–10.8)

## 2020-07-01 LAB — COMPREHENSIVE METABOLIC PANEL
ALT: 16 IU/L (ref 0–32)
AST: 18 IU/L (ref 0–40)
Albumin/Globulin Ratio: 1.7 (ref 1.2–2.2)
Albumin: 4.5 g/dL (ref 3.8–4.8)
Alkaline Phosphatase: 93 IU/L (ref 44–121)
BUN/Creatinine Ratio: 15 (ref 12–28)
BUN: 13 mg/dL (ref 8–27)
Bilirubin Total: 1.4 mg/dL — ABNORMAL HIGH (ref 0.0–1.2)
CO2: 24 mmol/L (ref 20–29)
Calcium: 9.7 mg/dL (ref 8.7–10.3)
Chloride: 103 mmol/L (ref 96–106)
Creatinine, Ser: 0.89 mg/dL (ref 0.57–1.00)
Globulin, Total: 2.6 g/dL (ref 1.5–4.5)
Glucose: 105 mg/dL — ABNORMAL HIGH (ref 65–99)
Potassium: 4.2 mmol/L (ref 3.5–5.2)
Sodium: 142 mmol/L (ref 134–144)
Total Protein: 7.1 g/dL (ref 6.0–8.5)
eGFR: 74 mL/min/{1.73_m2} (ref >59)

## 2020-07-01 LAB — LIPID PANEL W/O CHOL/HDL RATIO
Cholesterol, Total: 141 mg/dL (ref 100–199)
HDL: 44 mg/dL (ref >39)
LDL Chol Calc (NIH): 68 mg/dL (ref 0–99)
Triglycerides: 174 mg/dL — ABNORMAL HIGH (ref 0–149)
VLDL Cholesterol Cal: 29 mg/dL (ref 5–40)

## 2020-07-01 LAB — MICROSCOPIC EXAMINATION
Bacteria, UA: NONE SEEN
Casts: NONE SEEN /lpf
Epithelial Cells (non renal): NONE SEEN /hpf (ref 0–10)
RBC, Urine: NONE SEEN /hpf (ref 0–2)
WBC, UA: NONE SEEN /hpf (ref 0–5)

## 2020-07-01 LAB — TSH: TSH: 0.292 u[IU]/mL — ABNORMAL LOW (ref 0.450–4.500)

## 2020-07-01 MED FILL — Furosemide Tab 40 MG: ORAL | 90 days supply | Qty: 90 | Fill #1 | Status: CN

## 2020-07-01 MED FILL — Losartan Potassium Tab 50 MG: ORAL | 90 days supply | Qty: 90 | Fill #1 | Status: CN

## 2020-07-01 MED FILL — Potassium Chloride Tab ER 10 mEq: ORAL | 90 days supply | Qty: 90 | Fill #1 | Status: CN

## 2020-07-05 ENCOUNTER — Other Ambulatory Visit: Payer: Self-pay

## 2020-07-05 MED ORDER — CARVEDILOL 3.125 MG PO TABS
ORAL_TABLET | Freq: Two times a day (BID) | ORAL | 0 refills | Status: DC
  Filled 2020-07-05: qty 180, 90d supply, fill #0
  Filled 2020-07-05: qty 180, fill #0

## 2020-07-06 ENCOUNTER — Ambulatory Visit: Payer: Self-pay | Admitting: Internal Medicine

## 2020-07-06 ENCOUNTER — Encounter: Payer: Self-pay | Admitting: Internal Medicine

## 2020-07-06 DIAGNOSIS — I214 Non-ST elevation (NSTEMI) myocardial infarction: Secondary | ICD-10-CM

## 2020-07-06 DIAGNOSIS — R7989 Other specified abnormal findings of blood chemistry: Secondary | ICD-10-CM

## 2020-07-06 DIAGNOSIS — J449 Chronic obstructive pulmonary disease, unspecified: Secondary | ICD-10-CM

## 2020-07-06 NOTE — Progress Notes (Signed)
Established Patient Office Visit  Subjective:  Patient ID: Lindsey Gibson, female    DOB: 1960/04/23  Age: 60 y.o. MRN: [id number]  CC:  Chief Complaint  Patient presents with   Follow-up    HPI KAESHA Gibson is a 60 y/o female who presents for routine follow-up. Discussed previous lab results. Blood Pressure last taken was 120/80. Discussed weight of 248lbs. No diabetic problems or cholesterol problems. Pt denies chest pain. Pt asked about thyroid and TSH, will check again in 3 months.   renew  Past Medical History:  Diagnosis Date   Asthma    CAD (coronary artery disease)    a. 12/2016 NSTEMI/PCI: LM nl, LAD 30ost, 38m(IVUS->severe plaque burden->3.0 x 18 Sierra DES), D1/2/3 nl, LCX 30p, OM1/2/3 nl, RCA nl, RPDA/RPAV/RPL1/2 nl.   [place]    Hyperlipidemia    Hypertension    Ischemic cardiomyopathy    a. 12/2016 Echo: EF 20-25%, mid-apicalanteroseptal, ant, and apical AK, GR1 DD, triv AI, mildly dil LA; b. 04/2017 Echo: EF 50-55%, Gr2 DD, Nl RV fxn.   [place]    [facility] infarction ([facility]     Past Surgical History:  Procedure Laterality Date   CORONARY STENT INTERVENTION N/A 12/27/2016   Procedure: CORONARY STENT INTERVENTION;  Surgeon: AWellington Hampshire MD;  [place] [place];  Service: Cardiovascular;  Laterality: N/A;   INTRAVASCULAR ULTRASOUND/IVUS N/A 12/27/2016   Procedure: Intravascular Ultrasound/IVUS;  Surgeon: AWellington Hampshire MD;  [place];  Service: Cardiovascular;  Laterality: N/A;   KNEE ARTHROSCOPY Right    LEFT HEART CATH AND CORONARY ANGIOGRAPHY N/A 12/27/2016   Procedure: LEFT HEART CATH AND CORONARY ANGIOGRAPHY;  Surgeon: AWellington Hampshire MD;  [place];  Service: Cardiovascular;  Laterality: N/A;   SHOULDER ARTHROSCOPY WITH SUBACROMIAL DECOMPRESSION Right 11/22/2015   Procedure: SHOULDER ARTHROSCOPY WITH DEBRIDEMENT, DECOMPRESSION   AND BICEPS TENODESIS;  Surgeon: JCorky Mull MD;  Location: [facility] OR[facility];  Service: Orthopedics;  Laterality: Right;   TONSILLECTOMY     ULNAR NERVE TRANSPOSITION Right 11/22/2015   Procedure: ULNAR NERVE DECOMPRESSION/TRANSPOSITION;  Surgeon: JCorky Mull MD;  Location: [facility];  Service: Orthopedics;  Laterality: Right;    Family History  Problem Relation Age of Onset   Heart disease Mother    Diabetes Mother    Diabetes Sister     Social History   Socioeconomic History   Marital status: Married    Spouse name: Not on file   Number of children: Not on file   Years of education: Not on file   Highest education level: Not on file  Occupational History   Occupation: none  Tobacco Use   Smoking status: Former    Packs/day: 1.00    Years: 18.00    Pack years: 18.00    Types: Cigarettes   Smokeless tobacco: Never  Vaping Use   Vaping Use: Never used  Substance and Sexual Activity   Alcohol use: No   Drug use: No   Sexual activity: Not on file  Other Topics Concern   Not on file  Social History Narrative   Rents with family. Not on food stamps.   Social Determinants of Health   Financial Resource Strain: Not on file  Food Insecurity: Not on file  Transportation Needs: Not on file  Physical Activity: Not on file  Stress: Not on file  Social Connections: Not on file  Intimate Partner Violence:  Not on file    Outpatient Medications Prior to Visit  Medication Sig Dispense Refill   albuterol (VENTOLIN HFA) 108 (90 Base) MCG/ACT inhaler INHALE 1 PUFF INTO THE LUNGS EVERY 6 HOURS AS NEEDED FOR WHEEZING OR SHORTNESS OF BREATH 20.1 g 1   aspirin 81 MG chewable tablet CHEW 1 TABLET BY MOUTH EVERY DAY 90 tablet 3   atorvastatin (LIPITOR) 80 MG tablet TAKE ONE TABLET BY MOUTH EVERY DAY 90 tablet 3   carvedilol (COREG) 3.125 MG tablet Take 1 tablet by mouth 2 (two) times daily with a meal. 180 tablet 0   cetirizine (ZYRTEC) 10 MG tablet TAKE ONE TABLET BY MOUTH EVERY DAY. 90  tablet 1   furosemide (LASIX) 40 MG tablet TAKE ONE TABLET BY MOUTH EVERY DAY 90 tablet 3   losartan (COZAAR) 50 MG tablet TAKE ONE TABLET BY MOUTH EVERY DAY 90 tablet 3   Multiple Vitamin (MULTIVITAMIN WITH MINERALS) TABS tablet Take 1 tablet by mouth daily.     nitrofurantoin, macrocrystal-monohydrate, (MACROBID) 100 MG capsule Take 1 capsule (100 mg total) by mouth 2 (two) times daily. 14 capsule 0   nitroGLYCERIN (NITROSTAT) 0.4 MG SL tablet Place 1 tablet (0.4 mg total) under the tongue every 5 (five) minutes as needed for chest pain. 25 tablet 3   potassium chloride (KLOR-CON) 10 MEQ tablet TAKE ONE TABLET BY MOUTH EVERY DAY 90 tablet 3   spironolactone (ALDACTONE) 25 MG tablet TAKE ONE TABLET BY MOUTH EVERY DAY 90 tablet 3   No facility-administered medications prior to visit.    Allergies  Allergen Reactions   Penicillins Anaphylaxis    Has patient had a PCN reaction causing immediate rash, facial/tongue/throat swelling, SOB If all of the above answers are "NO", then may proceed with Cephalosporin use.   or lightheadedness with hypotension:unsure Has patient had a PCN reaction causing severe rash involving mucus membranes or skin necrosis:unsure Has patient had a PCN reaction that required hospitalization:unsure Has patient had a PCN reaction occurring within the last 10 years:unsure  Childhood reaction    ROS Review of Systems    Objective:    Physical Exam  Telephone visit  There were no vitals taken for this visit. Wt Readings from Last 3 Encounters:  06/29/20 248 lb (112.5 kg)  11/04/19 254 lb (115.2 kg)  08/06/19 248 lb (112.5 kg)     Health Maintenance Due  Topic Date Due   COVID-19 Vaccine (1) Never done   Pneumococcal Vaccine 65-12 Years old (1 - PCV) Never done   Hepatitis C Screening  Never done   TETANUS/TDAP  Never done   Zoster Vaccines- Shingrix (1 of 2) Never done   PAP SMEAR-Modifier  Never done   COLONOSCOPY (Pts 45-4yr Insurance coverage  will need to be confirmed)  Never done   MAMMOGRAM  Never done    There are no preventive care reminders to display for this patient.  Lab Results  Component Value Date   TSH 0.292 (L) 06/29/2020   Lab Results  Component Value Date   WBC 6.3 06/29/2020   HGB 12.9 06/29/2020   HCT 40.5 06/29/2020   MCV 85 06/29/2020   PLT 236 06/29/2020   Lab Results  Component Value Date   NA 142 06/29/2020   K 4.2 06/29/2020   CO2 24 06/29/2020   GLUCOSE 105 (H) 06/29/2020   BUN 13 06/29/2020   CREATININE 0.89 06/29/2020   BILITOT 1.4 (H) 06/29/2020   ALKPHOS 93 06/29/2020   AST 18  06/29/2020   ALT 16 06/29/2020   PROT 7.1 06/29/2020   ALBUMIN 4.5 06/29/2020   CALCIUM 9.7 06/29/2020   ANIONGAP 8 04/16/2017   EGFR 74 06/29/2020   Lab Results  Component Value Date   CHOL 141 06/29/2020   Lab Results  Component Value Date   HDL 44 06/29/2020   Lab Results  Component Value Date   LDLCALC 68 06/29/2020   Lab Results  Component Value Date   TRIG 174 (H) 06/29/2020   Lab Results  Component Value Date   CHOLHDL 3.6 08/05/2019   No results found for: HGBA1C    Assessment & Plan:   1. NSTEMI (non-ST elevated myocardial infarction) ([place]) Pt s ffollowed by cardiologst. Has a f/u in a few weeks. Remains aymptomatic for chest pain or cardiovascular symptoms. Her lDL apprears to be >70 on recent blood test. Blood rpessure appears to be in theraputic range.   2. Chronic obstructive pulmonary disease, unspecified COPD type ([place]) Appears to be stable  3. Morbid obesity ([place]) No change. Appears to be following reasonable diet.  4. Abnormal thyroid blood test  In her lab it is noted TSH is below normal range. Clinically no evidence of overactive thyroid. Plan to repeat study in 90 days. She will discuss it with caridologist in a few weeks as well. She follows her abs online and will review the test.   No orders of the defined types were placed in this encounter.   Follow-up:  Follow up in 6 months. Get Thyroid labs rechecked in 3 months.    Vonte M Hedgebeth

## 2020-07-13 ENCOUNTER — Ambulatory Visit: Payer: Self-pay | Admitting: Internal Medicine

## 2020-07-14 ENCOUNTER — Ambulatory Visit (INDEPENDENT_AMBULATORY_CARE_PROVIDER_SITE_OTHER): Payer: Self-pay | Admitting: Cardiovascular Disease

## 2020-07-14 ENCOUNTER — Other Ambulatory Visit: Payer: Self-pay

## 2020-07-14 ENCOUNTER — Encounter: Payer: Self-pay | Admitting: Cardiovascular Disease

## 2020-07-14 VITALS — BP 110/80 | HR 65 | Ht 71.0 in | Wt 244.0 lb

## 2020-07-14 DIAGNOSIS — I2510 Atherosclerotic heart disease of native coronary artery without angina pectoris: Secondary | ICD-10-CM

## 2020-07-14 DIAGNOSIS — I10 Essential (primary) hypertension: Secondary | ICD-10-CM

## 2020-07-14 DIAGNOSIS — E785 Hyperlipidemia, unspecified: Secondary | ICD-10-CM

## 2020-07-14 DIAGNOSIS — I5022 Chronic systolic (congestive) heart failure: Secondary | ICD-10-CM

## 2020-07-14 MED ORDER — ATORVASTATIN CALCIUM 80 MG PO TABS
ORAL_TABLET | Freq: Every day | ORAL | 3 refills | Status: DC
  Filled 2020-07-14: qty 90, 90d supply, fill #0
  Filled 2020-10-14: qty 90, 90d supply, fill #1
  Filled 2021-01-07 – 2021-01-10 (×2): qty 90, 90d supply, fill #2
  Filled 2021-04-01: qty 90, 90d supply, fill #3

## 2020-07-14 MED ORDER — FUROSEMIDE 20 MG PO TABS
20.0000 mg | ORAL_TABLET | Freq: Every day | ORAL | 3 refills | Status: DC
  Filled 2020-07-14 (×3): qty 90, 90d supply, fill #0
  Filled 2020-11-13: qty 50, 50d supply, fill #0
  Filled 2021-01-07: qty 90, 90d supply, fill #1
  Filled 2021-03-12 – 2021-04-01 (×2): qty 90, 90d supply, fill #2
  Filled 2021-06-02: qty 90, 90d supply, fill #0
  Filled 2021-06-02: qty 90, 90d supply, fill #3
  Filled 2021-06-05 – 2021-06-13 (×3): qty 90, 90d supply, fill #0

## 2020-07-14 MED ORDER — LOSARTAN POTASSIUM 50 MG PO TABS
ORAL_TABLET | Freq: Every day | ORAL | 3 refills | Status: DC
  Filled 2020-07-14: qty 90, fill #0
  Filled 2020-07-14: qty 90, 90d supply, fill #0
  Filled 2020-07-14: qty 90, fill #0
  Filled 2020-09-15: qty 90, 90d supply, fill #0
  Filled 2020-12-11: qty 90, 90d supply, fill #1
  Filled 2021-03-12: qty 90, 90d supply, fill #2
  Filled 2021-06-02: qty 90, 90d supply, fill #3
  Filled 2021-06-02: qty 90, 90d supply, fill #0

## 2020-07-14 MED ORDER — CARVEDILOL 3.125 MG PO TABS
ORAL_TABLET | Freq: Two times a day (BID) | ORAL | 3 refills | Status: DC
Start: 2020-07-14 — End: 2021-06-02
  Filled 2020-07-14: qty 180, 90d supply, fill #0
  Filled 2020-10-27: qty 180, 90d supply, fill #1
  Filled 2021-01-28: qty 180, 90d supply, fill #2
  Filled 2021-04-28: qty 180, 90d supply, fill #3

## 2020-07-14 MED ORDER — SPIRONOLACTONE 25 MG PO TABS
ORAL_TABLET | Freq: Every day | ORAL | 3 refills | Status: DC
  Filled 2020-07-14: qty 90, 90d supply, fill #0
  Filled 2020-10-04: qty 90, 90d supply, fill #1
  Filled 2020-12-11 – 2021-01-05 (×3): qty 90, 90d supply, fill #2
  Filled 2021-03-12 – 2021-04-01 (×2): qty 90, 90d supply, fill #3

## 2020-07-14 NOTE — Progress Notes (Signed)
Cardiology Office Note   Date:  07/14/2020   ID:  Lindsey Gibson, DOB 1960/04/28, MRN [id number]  PCP:  [name], MD  Cardiologist:   Lorine Bears, MD   Chief Complaint  Patient presents with   OTHER    12 month f/u no complaints today. Meds reviewed verbally with pt.      History of Present Illness: Lindsey Gibson is a 60 y.o. female who presents for a follow-up visit regarding coronary artery disease and chronic systolic heart failure.  Other medical problems include hyperlipidemia, obesity and COPD. She was hospitalized in December, 2018 with pulmonary edema and non-ST elevation myocardial infarction.  Echocardiogram showed an EF of 20-25%.  Cardiac catheterization showed severe mid LAD disease which was treated with PCI and drug-eluting stent placement.  She did not tolerate Entresto due to hypotension and recurrent chest pain.   She underwent a repeat echocardiogram in April of 2019 which showed improvement in EF to 50-55%.  There was evidence of grade 2 diastolic dysfunction.   Has been doing very well with no recent chest pain, shortness of or palpitations.  She continues to take furosemide 40 mg daily.  She had recent labs done with Dr. Candelaria Stagers which were reviewed today by me and overall were unremarkable except for mildly decreased TSH.  She has no convincing symptoms of hyperthyroidism.     Past Medical History:  Diagnosis Date   Asthma    CAD (coronary artery disease)    a. 12/2016 NSTEMI/PCI: LM nl, LAD 30ost, 63m (IVUS->severe plaque burden->3.0 x 18 Sierra DES), D1/2/3 nl, LCX 30p, OM1/2/3 nl, RCA nl, RPDA/RPAV/RPL1/2 nl.   COPD (chronic obstructive pulmonary disease) (HCC)    Hyperlipidemia    Hypertension    Ischemic cardiomyopathy    a. 12/2016 Echo: EF 20-25%, mid-apicalanteroseptal, ant, and apical AK, GR1 DD, triv AI, mildly dil LA; b. 04/2017 Echo: EF 50-55%, Gr2 DD, Nl RV fxn.   Morbid obesity (HCC)    Myocardial infarction [facility])     Past  Surgical History:  Procedure Laterality Date   CORONARY STENT INTERVENTION N/A 12/27/2016   Procedure: CORONARY STENT INTERVENTION;  Surgeon: Iran Ouch, MD;  Location: [facility] INVASIVE CV LAB;  Service: Cardiovascular;  Laterality: N/A;   INTRAVASCULAR ULTRASOUND/IVUS N/A 12/27/2016   Procedure: Intravascular Ultrasound/IVUS;  Surgeon: Iran Ouch, MD;  Location: [facility] INVASIVE CV LAB;  Service: Cardiovascular;  Laterality: N/A;   KNEE ARTHROSCOPY Right    LEFT HEART CATH AND CORONARY ANGIOGRAPHY N/A 12/27/2016   Procedure: LEFT HEART CATH AND CORONARY ANGIOGRAPHY;  Surgeon: Iran Ouch, MD;  Location: [facility] INVASIVE CV LAB;  Service: Cardiovascular;  Laterality: N/A;   SHOULDER ARTHROSCOPY WITH SUBACROMIAL DECOMPRESSION Right 11/22/2015   Procedure: SHOULDER ARTHROSCOPY WITH DEBRIDEMENT, DECOMPRESSION  AND BICEPS TENODESIS;  Surgeon: Christena Flake, MD;  Location: [facility] ORS;  Service: Orthopedics;  Laterality: Right;   TONSILLECTOMY     ULNAR NERVE TRANSPOSITION Right 11/22/2015   Procedure: ULNAR NERVE DECOMPRESSION/TRANSPOSITION;  Surgeon: Christena Flake, MD;  Location: [facility] ORS;  Service: Orthopedics;  Laterality: Right;     Current Outpatient Medications  Medication Sig Dispense Refill   albuterol (VENTOLIN HFA) 108 (90 Base) MCG/ACT inhaler INHALE 1 PUFF INTO THE LUNGS EVERY 6 HOURS AS NEEDED FOR WHEEZING OR SHORTNESS OF BREATH 20.1 g 1   aspirin 81 MG chewable tablet CHEW 1 TABLET BY MOUTH EVERY DAY 90 tablet 3   atorvastatin (LIPITOR) 80 MG tablet TAKE ONE  TABLET BY MOUTH EVERY DAY 90 tablet 3   carvedilol (COREG) 3.125 MG tablet Take 1 tablet by mouth 2 (two) times daily with a meal. 180 tablet 0   cetirizine (ZYRTEC) 10 MG tablet TAKE ONE TABLET BY MOUTH EVERY DAY. 90 tablet 1   furosemide (LASIX) 40 MG tablet TAKE ONE TABLET BY MOUTH EVERY DAY 90 tablet 3   losartan (COZAAR) 50 MG tablet TAKE ONE TABLET BY MOUTH EVERY DAY 90 tablet 3   Multiple Vitamin (MULTIVITAMIN  WITH MINERALS) TABS tablet Take 1 tablet by mouth daily.     nitroGLYCERIN (NITROSTAT) 0.4 MG SL tablet Place 1 tablet (0.4 mg total) under the tongue every 5 (five) minutes as needed for chest pain. 25 tablet 3   potassium chloride (KLOR-CON) 10 MEQ tablet TAKE ONE TABLET BY MOUTH EVERY DAY 90 tablet 3   spironolactone (ALDACTONE) 25 MG tablet TAKE ONE TABLET BY MOUTH EVERY DAY 90 tablet 3   No current facility-administered medications for this visit.    Allergies:   Penicillins    Social History:  The patient  reports that she has quit smoking. Her smoking use included cigarettes. She has a 18.00 pack-year smoking history. She has never used smokeless tobacco. She reports that she does not drink alcohol and does not use drugs.   Family History:  The patient's family history includes Diabetes in her mother and sister; Heart disease in her mother.    ROS:  Please see the history of present illness.   Otherwise, review of systems are positive for none.   All other systems are reviewed and negative.    PHYSICAL EXAM: VS:  BP 110/80 (BP Location: Left Arm, Patient Position: Sitting, Cuff Size: Large)   Pulse 65   Ht 5\' 11"  (1.803 m)   Wt 244 lb (110.7 kg)   SpO2 98%   BMI 34.03 kg/m  , BMI Body mass index is 34.03 kg/m. GEN: Well nourished, well developed, in no acute distress  HEENT: normal  Neck: no JVD, carotid bruits, or masses Cardiac: RRR; no murmurs, rubs, or gallops, trace bilateral edema  Respiratory:  clear to auscultation bilaterally, normal work of breathing GI: soft, nontender, nondistended, + BS MS: no deformity or atrophy  Skin: warm and dry, no rash Neuro:  Strength and sensation are intact Psych: euthymic mood, full affect   EKG:  EKG is ordered today. EKG showed normal sinus rhythm with no significant ST or T wave changes.   Recent Labs: 06/29/2020: ALT 16; BUN 13; Creatinine, Ser 0.89; Hemoglobin 12.9; Platelets 236; Potassium 4.2; Sodium 142; TSH 0.292     Lipid Panel    Component Value Date/Time   CHOL 141 06/29/2020 0948   TRIG 174 (H) 06/29/2020 0948   HDL 44 06/29/2020 0948   CHOLHDL 3.6 08/05/2019 1007   CHOLHDL 4.7 12/28/2016 0642   VLDL 24 12/28/2016 0642   LDLCALC 68 06/29/2020 0948      Wt Readings from Last 3 Encounters:  07/14/20 244 lb (110.7 kg)  06/29/20 248 lb (112.5 kg)  11/04/19 254 lb (115.2 kg)       No flowsheet data found.    ASSESSMENT AND PLAN:  1.  Coronary artery disease involving native coronary arteries without angina: Continue aspirin indefinitely.  2.  Chronic systolic heart failure: Ejection fraction improved to 50-55%.  No significant volume overload.  I elected to decrease furosemide to 20 mg daily and stop potassium.  We refilled her cardiac medications including carvedilol, losartan  and spironolactone.  3.  Hyperlipidemia: I reviewed most recent lipid profile done in June which showed an LDL of 68.  I refilled atorvastatin.  4.  Essential hypertension: Blood pressure is well controlled on current medications.    Disposition:   FU with me in 12 months  Signed,  Lorine Bears, MD  07/14/2020 9:30 AM    [facility]

## 2020-07-14 NOTE — Patient Instructions (Signed)
Medication Instructions:  Your physician has recommended you make the following change in your medication:   1) STOP Potassium  2) DECREASE Lasix to 20 mg daily  Your cardiac medications have been refilled today.   *If you need a refill on your cardiac medications before your next appointment, please call your pharmacy*   Lab Work: None ordered  If you have labs (blood work) drawn today and your tests are completely normal, you will receive your results only by: MyChart Message (if you have MyChart) OR A paper copy in the mail If you have any lab test that is abnormal or we need to change your treatment, we will call you to review the results.   Testing/Procedures: None ordered   Follow-Up: At [facility], you and your health needs are our priority.  As part of our continuing mission to provide you with exceptional heart care, we have created designated Provider Care Teams.  These Care Teams include your primary Cardiologist (physician) and Advanced Practice Providers (APPs -  Physician Assistants and Nurse Practitioners) who all work together to provide you with the care you need, when you need it.  We recommend signing up for the patient portal called "MyChart".  Sign up information is provided on this After Visit Summary.  MyChart is used to connect with patients for Virtual Visits (Telemedicine).  Patients are able to view lab/test results, encounter notes, upcoming appointments, etc.  Non-urgent messages can be sent to your provider as well.   To learn more about what you can do with MyChart, go to [email or web address].    Your next appointment:   Your physician wants you to follow-up in: 1 year You will receive a reminder letter in the mail two months in advance. If you don't receive a letter, please call our office to schedule the follow-up appointment.   The format for your next appointment:   In Person  Provider:   You may see Lorine Bears, MD or one of the  following Advanced Practice Providers on your designated Care Team:   Nicolasa Ducking, NP Eula Listen, PA-C Marisue Ivan, PA-C Cadence Fransico Michael, [place]   Other Instructions N/A

## 2020-07-19 ENCOUNTER — Ambulatory Visit: Payer: Self-pay | Admitting: Cardiovascular Disease

## 2020-09-15 ENCOUNTER — Other Ambulatory Visit: Payer: Self-pay

## 2020-09-15 ENCOUNTER — Other Ambulatory Visit: Payer: Self-pay | Admitting: Cardiovascular Disease

## 2020-09-15 MED ORDER — ASPIRIN 81 MG PO CHEW
CHEWABLE_TABLET | ORAL | 2 refills | Status: DC
  Filled 2020-09-15: qty 90, 90d supply, fill #0
  Filled 2020-12-11: qty 90, 90d supply, fill #1
  Filled 2021-03-12: qty 90, 90d supply, fill #2

## 2020-09-28 ENCOUNTER — Other Ambulatory Visit: Payer: Self-pay

## 2020-09-28 DIAGNOSIS — I214 Non-ST elevation (NSTEMI) myocardial infarction: Secondary | ICD-10-CM

## 2020-09-28 DIAGNOSIS — R7989 Other specified abnormal findings of blood chemistry: Secondary | ICD-10-CM

## 2020-09-29 LAB — CBC WITH DIFFERENTIAL/PLATELET
Basophils Absolute: 0 10*3/uL (ref 0.0–0.2)
Basos: 1 %
EOS (ABSOLUTE): 0.2 10*3/uL (ref 0.0–0.4)
Eos: 4 %
Hematocrit: 36.7 % (ref 34.0–46.6)
Hemoglobin: 11.9 g/dL (ref 11.1–15.9)
Immature Grans (Abs): 0 10*3/uL (ref 0.0–0.1)
Immature Granulocytes: 0 %
Lymphocytes Absolute: 2.1 10*3/uL (ref 0.7–3.1)
Lymphs: 37 %
MCH: 26.9 pg (ref 26.6–33.0)
MCHC: 32.4 g/dL (ref 31.5–35.7)
MCV: 83 fL (ref 79–97)
Monocytes Absolute: 0.4 10*3/uL (ref 0.1–0.9)
Monocytes: 7 %
Neutrophils Absolute: 3 10*3/uL (ref 1.4–7.0)
Neutrophils: 51 %
Platelets: 232 10*3/uL (ref 150–450)
RBC: 4.43 x10E6/uL (ref 3.77–5.28)
RDW: 14.3 % (ref 11.7–15.4)
WBC: 5.8 10*3/uL (ref 3.4–10.8)

## 2020-09-29 LAB — COMPREHENSIVE METABOLIC PANEL
ALT: 21 IU/L (ref 0–32)
AST: 18 IU/L (ref 0–40)
Albumin/Globulin Ratio: 1.4 (ref 1.2–2.2)
Albumin: 4.2 g/dL (ref 3.8–4.8)
Alkaline Phosphatase: 106 IU/L (ref 44–121)
BUN/Creatinine Ratio: 18 (ref 12–28)
BUN: 13 mg/dL (ref 8–27)
Bilirubin Total: 1.1 mg/dL (ref 0.0–1.2)
CO2: 25 mmol/L (ref 20–29)
Calcium: 9.9 mg/dL (ref 8.7–10.3)
Chloride: 102 mmol/L (ref 96–106)
Creatinine, Ser: 0.72 mg/dL (ref 0.57–1.00)
Globulin, Total: 3 g/dL (ref 1.5–4.5)
Glucose: 92 mg/dL (ref 70–99)
Potassium: 4.2 mmol/L (ref 3.5–5.2)
Sodium: 140 mmol/L (ref 134–144)
Total Protein: 7.2 g/dL (ref 6.0–8.5)
eGFR: 95 mL/min/{1.73_m2} (ref >59)

## 2020-09-29 LAB — THYROID PANEL WITH TSH
Free Thyroxine Index: 2.3 (ref 1.2–4.9)
T3 Uptake Ratio: 26 % (ref 24–39)
T4, Total: 8.8 ug/dL (ref 4.5–12.0)
TSH: 0.297 u[IU]/mL — ABNORMAL LOW (ref 0.450–4.500)

## 2020-09-29 LAB — HEMOGLOBIN A1C
Est. average glucose Bld gHb Est-mCnc: 120 mg/dL
Hgb A1c MFr Bld: 5.8 % — ABNORMAL HIGH (ref 4.8–5.6)

## 2020-09-29 LAB — URINALYSIS, ROUTINE W REFLEX MICROSCOPIC
Bilirubin, UA: NEGATIVE
Glucose, UA: NEGATIVE
Ketones, UA: NEGATIVE
Nitrite, UA: NEGATIVE
Protein,UA: NEGATIVE
RBC, UA: NEGATIVE
Specific Gravity, UA: 1.008 (ref 1.005–1.030)
Urobilinogen, Ur: 0.2 mg/dL (ref 0.2–1.0)
pH, UA: 6 (ref 5.0–7.5)

## 2020-09-29 LAB — LIPID PANEL WITH LDL/HDL RATIO
Cholesterol, Total: 132 mg/dL (ref 100–199)
HDL: 41 mg/dL (ref >39)
LDL Chol Calc (NIH): 54 mg/dL (ref 0–99)
LDL/HDL Ratio: 1.3 ratio (ref 0.0–3.2)
Triglycerides: 228 mg/dL — ABNORMAL HIGH (ref 0–149)
VLDL Cholesterol Cal: 37 mg/dL (ref 5–40)

## 2020-09-29 LAB — MICROSCOPIC EXAMINATION
Bacteria, UA: NONE SEEN
Casts: NONE SEEN /lpf
RBC, Urine: NONE SEEN /hpf (ref 0–2)

## 2020-10-04 ENCOUNTER — Other Ambulatory Visit: Payer: Self-pay

## 2020-10-14 ENCOUNTER — Other Ambulatory Visit: Payer: Self-pay

## 2020-10-27 ENCOUNTER — Other Ambulatory Visit: Payer: Self-pay

## 2020-11-01 ENCOUNTER — Other Ambulatory Visit: Payer: Self-pay

## 2020-11-14 ENCOUNTER — Other Ambulatory Visit: Payer: Self-pay

## 2020-12-12 ENCOUNTER — Other Ambulatory Visit: Payer: Self-pay

## 2020-12-13 ENCOUNTER — Other Ambulatory Visit: Payer: Self-pay

## 2020-12-20 ENCOUNTER — Other Ambulatory Visit: Payer: Self-pay

## 2021-01-04 ENCOUNTER — Other Ambulatory Visit: Payer: Self-pay

## 2021-01-04 VITALS — BP 129/83 | HR 71 | Temp 98.1°F | Ht 71.0 in | Wt 244.1 lb

## 2021-01-04 DIAGNOSIS — I214 Non-ST elevation (NSTEMI) myocardial infarction: Secondary | ICD-10-CM

## 2021-01-05 ENCOUNTER — Other Ambulatory Visit: Payer: Self-pay

## 2021-01-05 LAB — CBC WITH DIFFERENTIAL/PLATELET
Basophils Absolute: 0 10*3/uL (ref 0.0–0.2)
Basos: 0 %
EOS (ABSOLUTE): 0.1 10*3/uL (ref 0.0–0.4)
Eos: 2 %
Hematocrit: 37.7 % (ref 34.0–46.6)
Hemoglobin: 12.1 g/dL (ref 11.1–15.9)
Immature Grans (Abs): 0 10*3/uL (ref 0.0–0.1)
Immature Granulocytes: 0 %
Lymphocytes Absolute: 2.2 10*3/uL (ref 0.7–3.1)
Lymphs: 36 %
MCH: 26.2 pg — ABNORMAL LOW (ref 26.6–33.0)
MCHC: 32.1 g/dL (ref 31.5–35.7)
MCV: 82 fL (ref 79–97)
Monocytes Absolute: 0.6 10*3/uL (ref 0.1–0.9)
Monocytes: 10 %
Neutrophils Absolute: 3 10*3/uL (ref 1.4–7.0)
Neutrophils: 52 %
Platelets: 225 10*3/uL (ref 150–450)
RBC: 4.61 x10E6/uL (ref 3.77–5.28)
RDW: 14.4 % (ref 11.7–15.4)
WBC: 5.9 10*3/uL (ref 3.4–10.8)

## 2021-01-05 LAB — TSH: TSH: 0.28 u[IU]/mL — ABNORMAL LOW (ref 0.450–4.500)

## 2021-01-05 LAB — COMPREHENSIVE METABOLIC PANEL
ALT: 17 IU/L (ref 0–32)
AST: 23 IU/L (ref 0–40)
Albumin/Globulin Ratio: 1.7 (ref 1.2–2.2)
Albumin: 4.4 g/dL (ref 3.8–4.8)
Alkaline Phosphatase: 101 IU/L (ref 44–121)
BUN/Creatinine Ratio: 20 (ref 12–28)
BUN: 16 mg/dL (ref 8–27)
Bilirubin Total: 1.4 mg/dL — ABNORMAL HIGH (ref 0.0–1.2)
CO2: 23 mmol/L (ref 20–29)
Calcium: 9.5 mg/dL (ref 8.7–10.3)
Chloride: 100 mmol/L (ref 96–106)
Creatinine, Ser: 0.79 mg/dL (ref 0.57–1.00)
Globulin, Total: 2.6 g/dL (ref 1.5–4.5)
Glucose: 100 mg/dL — ABNORMAL HIGH (ref 70–99)
Potassium: 4.3 mmol/L (ref 3.5–5.2)
Sodium: 141 mmol/L (ref 134–144)
Total Protein: 7 g/dL (ref 6.0–8.5)
eGFR: 85 mL/min/{1.73_m2} (ref >59)

## 2021-01-09 ENCOUNTER — Other Ambulatory Visit: Payer: Self-pay

## 2021-01-10 ENCOUNTER — Other Ambulatory Visit: Payer: Self-pay

## 2021-01-11 ENCOUNTER — Ambulatory Visit: Payer: Self-pay | Admitting: Internal Medicine

## 2021-01-11 DIAGNOSIS — I10 Essential (primary) hypertension: Secondary | ICD-10-CM | POA: Insufficient documentation

## 2021-01-11 DIAGNOSIS — R7989 Other specified abnormal findings of blood chemistry: Secondary | ICD-10-CM | POA: Insufficient documentation

## 2021-01-11 DIAGNOSIS — I214 Non-ST elevation (NSTEMI) myocardial infarction: Secondary | ICD-10-CM

## 2021-01-11 NOTE — Progress Notes (Signed)
Established Patient Office Visit  Subjective:  Patient ID: Lindsey Gibson, female    DOB: 1960/04/26  Age: 61 y.o. MRN: [id number]  CC:  Chief Complaint  Patient presents with   Follow-up    [name] is a 61 y/o female who presents for routine follow up. Labs done 01/04/2021. Discussed labs results regarding thyroid levels. Spoke with the patient about an outpatient thyroid scan at the hospital. Explained to pt that she needs to come to [facility] to pick up [facility] Application to cover this scan.   Past Medical History:  Diagnosis Date   Asthma    CAD (coronary artery disease)    a. 12/2016 NSTEMI/PCI: LM nl, LAD 30ost, 52m(IVUS->severe plaque burden->3.0 x 18 Sierra DES), D1/2/3 nl, LCX 30p, OM1/2/3 nl, RCA nl, RPDA/RPAV/RPL1/2 nl.   COPD (chronic obstructive pulmonary disease) ([facility]    Hyperlipidemia    Hypertension    Ischemic cardiomyopathy    a. 12/2016 Echo: EF 20-25%, mid-apicalanteroseptal, ant, and apical AK, GR1 DD, triv AI, mildly dil LA; b. 04/2017 Echo: EF 50-55%, Gr2 DD, Nl RV fxn.   [place]    [facility]     Past Surgical History:  Procedure Laterality Date   CORONARY STENT INTERVENTION N/A 12/27/2016   Procedure: CORONARY STENT INTERVENTION;  Surgeon: AWellington Hampshire MD;  [facility];  Service: Cardiovascular;  Laterality: N/A;   INTRAVASCULAR ULTRASOUND/IVUS N/A 12/27/2016   Procedure: Intravascular Ultrasound/IVUS;  Surgeon: AWellington Hampshire MD;  [facility];  Service: Cardiovascular;  Laterality: N/A;   KNEE ARTHROSCOPY Right    LEFT HEART CATH AND CORONARY ANGIOGRAPHY N/A 12/27/2016   Procedure: LEFT HEART CATH AND CORONARY ANGIOGRAPHY;  Surgeon: AWellington Hampshire MD;  [place];  Service: Cardiovascular;  Laterality: N/A;   SHOULDER ARTHROSCOPY WITH SUBACROMIAL DECOMPRESSION Right 11/22/2015   Procedure: SHOULDER ARTHROSCOPY WITH  DEBRIDEMENT, DECOMPRESSION  AND BICEPS TENODESIS;  Surgeon: JCorky Mull MD;  Location: [facility] ORS;  Service: Orthopedics;  Laterality: Right;   TONSILLECTOMY     ULNAR NERVE TRANSPOSITION Right 11/22/2015   Procedure: ULNAR NERVE DECOMPRESSION/TRANSPOSITION;  Surgeon: JCorky Mull MD;  Location: [facility] ORS;  Service: Orthopedics;  Laterality: Right;    Family History  Problem Relation Age of Onset   Heart disease Mother    Diabetes Mother    Diabetes Sister     Social History   Socioeconomic History   Marital status: Married    Spouse name: Not on file   Number of children: Not on file   Years of education: Not on file   Highest education level: Not on file  Occupational History   Occupation: none  Tobacco Use   Smoking status: Former    Packs/day: 1.00    Years: 18.00    Pack years: 18.00    Types: Cigarettes   Smokeless tobacco: Never  Vaping Use   Vaping Use: Never used  Substance and Sexual Activity   Alcohol use: No   Drug use: No   Sexual activity: Not on file  Other Topics Concern   Not on file  Social History Narrative   Rents with family. Not on food stamps.   Social Determinants of Health   Financial Resource Strain: Not on file  Food Insecurity: Not on file  Transportation Needs: Not on file  Physical Activity: Not on file  Stress: Not on file  Social Connections:  Not on file  Intimate Partner Violence: Not on file    Outpatient Medications Prior to Visit  Medication Sig Dispense Refill   albuterol (VENTOLIN HFA) 108 (90 Base) MCG/ACT inhaler INHALE 1 PUFF INTO THE LUNGS EVERY 6 HOURS AS NEEDED FOR WHEEZING OR SHORTNESS OF BREATH 20.1 g 1   aspirin 81 MG chewable tablet CHEW 1 TABLET BY MOUTH ONCE EVERY DAY. 90 tablet 2   atorvastatin (LIPITOR) 80 MG tablet TAKE ONE TABLET BY MOUTH EVERY DAY 90 tablet 3   carvedilol (COREG) 3.125 MG tablet Take 1 tablet by mouth 2 (two) times daily with a meal. 180 tablet 3   cetirizine (ZYRTEC) 10 MG tablet TAKE  ONE TABLET BY MOUTH EVERY DAY. 90 tablet 1   furosemide (LASIX) 20 MG tablet Take 1 tablet (20 mg total) by mouth once daily. 90 tablet 3   losartan (COZAAR) 50 MG tablet TAKE ONE TABLET BY MOUTH ONCE EVERY DAY 90 tablet 3   Multiple Vitamin (MULTIVITAMIN WITH MINERALS) TABS tablet Take 1 tablet by mouth daily.     nitroGLYCERIN (NITROSTAT) 0.4 MG SL tablet Place 1 tablet (0.4 mg total) under the tongue every 5 (five) minutes as needed for chest pain. 25 tablet 3   potassium chloride (KLOR-CON) 10 MEQ tablet TAKE ONE TABLET BY MOUTH EVERY DAY 90 tablet 3   spironolactone (ALDACTONE) 25 MG tablet TAKE ONE TABLET BY MOUTH EVERY DAY 90 tablet 3   No facility-administered medications prior to visit.    Allergies  Allergen Reactions   Penicillins Anaphylaxis    Has patient had a PCN reaction causing immediate rash, facial/tongue/throat swelling, SOB If all of the above answers are "NO", then may proceed with Cephalosporin use.   or lightheadedness with hypotension:unsure Has patient had a PCN reaction causing severe rash involving mucus membranes or skin necrosis:unsure Has patient had a PCN reaction that required hospitalization:unsure Has patient had a PCN reaction occurring within the last 10 years:unsure  Childhood reaction    ROS Review of Systems    Objective:    Physical Exam  There were no vitals taken for this visit. Wt Readings from Last 3 Encounters:  01/04/21 244 lb 1.6 oz (110.7 kg)  09/28/20 243 lb 8 oz (110.5 kg)  07/14/20 244 lb (110.7 kg)     Health Maintenance Due  Topic Date Due   COVID-19 Vaccine (1) Never done   Pneumococcal Vaccine 6-71 Years old (1 - PCV) Never done   Hepatitis C Screening  Never done   TETANUS/TDAP  Never done   Zoster Vaccines- Shingrix (1 of 2) Never done   PAP SMEAR-Modifier  Never done   COLONOSCOPY (Pts 45-37yr Insurance coverage will need to be confirmed)  Never done   MAMMOGRAM  Never done   INFLUENZA VACCINE  Never done     There are no preventive care reminders to display for this patient.  Lab Results  Component Value Date   TSH 0.280 (L) 01/04/2021   Lab Results  Component Value Date   WBC 5.9 01/04/2021   HGB 12.1 01/04/2021   HCT 37.7 01/04/2021   MCV 82 01/04/2021   PLT 225 01/04/2021   Lab Results  Component Value Date   NA 141 01/04/2021   K 4.3 01/04/2021   CO2 23 01/04/2021   GLUCOSE 100 (H) 01/04/2021   BUN 16 01/04/2021   CREATININE 0.79 01/04/2021   BILITOT 1.4 (H) 01/04/2021   ALKPHOS 101 01/04/2021   AST 23 01/04/2021  ALT 17 01/04/2021   PROT 7.0 01/04/2021   ALBUMIN 4.4 01/04/2021   CALCIUM 9.5 01/04/2021   ANIONGAP 8 04/16/2017   EGFR 85 01/04/2021   Lab Results  Component Value Date   CHOL 132 09/28/2020   Lab Results  Component Value Date   HDL 41 09/28/2020   Lab Results  Component Value Date   LDLCALC 54 09/28/2020   Lab Results  Component Value Date   TRIG 228 (H) 09/28/2020   Lab Results  Component Value Date   CHOLHDL 3.6 08/05/2019   Lab Results  Component Value Date   HGBA1C 5.8 (H) 09/28/2020      Assessment & Plan:   Problem List Items Addressed This Visit       Cardiovascular and Mediastinum   NSTEMI (non-ST elevated myocardial infarction) ([place]) - Primary   Hypertension     Other   Morbid obesity (HCC)   Low TSH level   1. Low TSH level Repeated TSH levels have been abdnormally low, sugguesting overactive thyroid gland. No other obvious cause for the abnormal lab test.   2. NSTEMI (non-ST elevated myocardial infarction) [facility]) Recent annual cardiac exam by her cardiologist found her condition to be stable and no changes in treatment or medication suggested  3. Morbid obesity ([place]) Weights suggestive of weight loss over the past year.   4. Hypertension, unspecified type Multiple checks show she is in the normal range on her blood pressure   Follow-up: Return in about 4 weeks (around 02/08/2021) for w/ labs.    Dede Query, CMA

## 2021-01-30 ENCOUNTER — Other Ambulatory Visit: Payer: Self-pay

## 2021-02-07 ENCOUNTER — Telehealth: Payer: Self-pay | Admitting: Cardiovascular Disease

## 2021-02-07 NOTE — Telephone Encounter (Signed)
Patient states she is having a thyroid scan soon and wants to know if she needs to stop any medication Informed patient of our clearance process but states she doesn't think they do that and she just wants to know Please call to discuss

## 2021-02-07 NOTE — Telephone Encounter (Signed)
Spoke with the patient. Patients thyroid imaging has been ordered by her pcp. Adv the patient to contact their office for their recommendation regarding  medication instructions prior to the test. Pt verbalized understanding.

## 2021-02-08 ENCOUNTER — Other Ambulatory Visit: Payer: Self-pay

## 2021-02-15 ENCOUNTER — Ambulatory Visit: Payer: Self-pay | Admitting: Internal Medicine

## 2021-02-16 ENCOUNTER — Other Ambulatory Visit: Payer: Self-pay

## 2021-02-16 ENCOUNTER — Encounter
Admission: RE | Admit: 2021-02-16 | Discharge: 2021-02-16 | Disposition: A | Discharge status: Home / Self Care | Payer: Self-pay | Source: Ambulatory Visit | Attending: Internal Medicine | Admitting: Internal Medicine

## 2021-02-16 DIAGNOSIS — R7989 Other specified abnormal findings of blood chemistry: Secondary | ICD-10-CM | POA: Insufficient documentation

## 2021-02-16 MED ORDER — SODIUM IODIDE I-123 7.4 MBQ CAPS
293.2000 | ORAL_CAPSULE | Freq: Once | ORAL | Status: AC
  Administered 2021-02-16: 293.2 via ORAL

## 2021-02-17 ENCOUNTER — Encounter
Admission: RE | Admit: 2021-02-17 | Discharge: 2021-02-17 | Disposition: A | Discharge status: Home / Self Care | Payer: Self-pay | Source: Ambulatory Visit | Attending: Internal Medicine | Admitting: Internal Medicine

## 2021-02-22 ENCOUNTER — Other Ambulatory Visit: Payer: Self-pay

## 2021-02-22 DIAGNOSIS — R7989 Other specified abnormal findings of blood chemistry: Secondary | ICD-10-CM

## 2021-02-23 LAB — T3: T3, Total: 132 ng/dL (ref 71–180)

## 2021-02-23 LAB — TSH: TSH: 0.29 u[IU]/mL — ABNORMAL LOW (ref 0.450–4.500)

## 2021-03-01 ENCOUNTER — Ambulatory Visit: Payer: Self-pay | Admitting: Internal Medicine

## 2021-03-01 ENCOUNTER — Other Ambulatory Visit: Payer: Self-pay

## 2021-03-01 ENCOUNTER — Encounter: Payer: Self-pay | Admitting: Internal Medicine

## 2021-03-01 VITALS — BP 138/62 | HR 67 | Temp 98.2°F | Ht 71.0 in | Wt 250.2 lb

## 2021-03-01 DIAGNOSIS — I10 Essential (primary) hypertension: Secondary | ICD-10-CM

## 2021-03-01 DIAGNOSIS — R7989 Other specified abnormal findings of blood chemistry: Secondary | ICD-10-CM

## 2021-03-01 NOTE — Progress Notes (Signed)
? ?Established Patient Office Visit ? ?Subjective:  ?Patient ID: Lindsey Gibson, female    DOB: Oct 08, 1960  Age: 61 y.o. MRN: [id number] ? ?CC:  ?Chief Complaint  ?Patient presents with  ? Follow-up  ?  Thyroid scan follow-up  ? ? ?HPI ?Lindsey Gibson is a 61 y/o female who presents for follow-up from thyroid scan done 02/16/2021. Explained results of scan to patient. Recommended ultrasound to continue to follow to nodules on her thyroid. She agreed and was given information to call radiology. Scan did not explain low TSH levels, will refer her to [facility] group at [facility] 03/21/2021. ? ?Past Medical History:  ?Diagnosis Date  ? Asthma   ? CAD (coronary artery disease)   ? a. 12/2016 NSTEMI/PCI: LM nl, LAD 30ost, 63m(IVUS->severe plaque burden->3.0 x 18 Sierra DES), D1/2/3 nl, LCX 30p, OM1/2/3 nl, RCA nl, RPDA/RPAV/RPL1/2 nl.  ? [place]ch[place]   ? Hyperlipidemia   ? Hypertension   ? Ischemic cardiomyopathy   ? a. 12/2016 Echo: EF 20-25%, mid-apicalanteroseptal, ant, and apical AK, GR1 DD, triv AI, mildly dil LA; b. 04/2017 Echo: EF 50-55%, Gr2 DD, Nl RV fxn.  ? [place]   ? Myocardial infarction ([facility]   ? ? ?Past Surgical History:  ?Procedure Laterality Date  ? CORONARY STENT INTERVENTION N/A 12/27/2016  ? Procedure: CORONARY STENT INTERVENTION;  Surgeon: AWellington Hampshire MD;  [facility];  Service: Cardiovascular;  Laterality: N/A;  ? INTRAVASCULAR ULTRASOUND/IVUS N/A 12/27/2016  ? Procedure: Intravascular Ultrasound/IVUS;  Surgeon: AWellington Hampshire MD;  [place];  Service: Cardiovascular;  Laterality: N/A;  ? KNEE ARTHROSCOPY Right   ? LEFT HEART CATH AND CORONARY ANGIOGRAPHY N/A 12/27/2016  ? Procedure: LEFT HEART CATH AND CORONARY ANGIOGRAPHY;  Surgeon: AWellington Hampshire MD;  [place];  Service: Cardiovascular;  Laterality: N/A;  ? SHOULDER ARTHROSCOPY WITH SUBACROMIAL  DECOMPRESSION Right 11/22/2015  ? Procedure: SHOULDER ARTHROSCOPY WITH DEBRIDEMENT, DECOMPRESSION  AND BICEPS TENODESIS;  Surgeon: JCorky Mull MD;  Location: [facility];  Service: Orthopedics;  Laterality: Right;  ? TONSILLECTOMY    ? ULNAR NERVE TRANSPOSITION Right 11/22/2015  ? Procedure: ULNAR NERVE DECOMPRESSION/TRANSPOSITION;  Surgeon: JCorky Mull MD;  Location: [facility];  Service: Orthopedics;  Laterality: Right;  ? ? ?Family History  ?Problem Relation Age of Onset  ? Heart disease Mother   ? Diabetes Mother   ? Diabetes Sister   ? ? ?Social History  ? ?Socioeconomic History  ? Marital status: Married  ?  Spouse name: Not on file  ? Number of children: Not on file  ? Years of education: Not on file  ? Highest education level: Not on file  ?Occupational History  ? Occupation: none  ?Tobacco Use  ? Smoking status: Former  ?  Packs/day: 1.00  ?  Years: 18.00  ?  Pack years: 18.00  ?  Types: Cigarettes  ? Smokeless tobacco: Never  ?Vaping Use  ? Vaping Use: Never used  ?Substance and Sexual Activity  ? Alcohol use: No  ? Drug use: No  ? Sexual activity: Not on file  ?Other Topics Concern  ? Not on file  ?Social History Narrative  ? Rents with family. Not on food stamps.  ? ?Social Determinants of Health  ? ?Financial Resource Strain: Not on file  ?Food Insecurity: Not on file  ?Transportation Needs: Not on file  ?Physical Activity: Not on  file  ?Stress: Not on file  ?Social Connections: Not on file  ?Intimate Partner Violence: Not on file  ? ? ?Outpatient Medications Prior to Visit  ?Medication Sig Dispense Refill  ? aspirin 81 MG chewable tablet CHEW 1 TABLET BY MOUTH ONCE EVERY DAY. 90 tablet 2  ? atorvastatin (LIPITOR) 80 MG tablet TAKE ONE TABLET BY MOUTH EVERY DAY 90 tablet 3  ? carvedilol (COREG) 3.125 MG tablet Take 1 tablet by mouth 2 (two) times daily with a meal. 180 tablet 3  ? furosemide (LASIX) 20 MG tablet Take 1 tablet (20 mg total) by mouth once daily. 90 tablet 3  ? losartan (COZAAR) 50 MG  tablet TAKE ONE TABLET BY MOUTH ONCE EVERY DAY 90 tablet 3  ? Multiple Vitamin (MULTIVITAMIN WITH MINERALS) TABS tablet Take 1 tablet by mouth daily.    ? spironolactone (ALDACTONE) 25 MG tablet TAKE ONE TABLET BY MOUTH EVERY DAY 90 tablet 3  ? albuterol (VENTOLIN HFA) 108 (90 Base) MCG/ACT inhaler INHALE 1 PUFF INTO THE LUNGS EVERY 6 HOURS AS NEEDED FOR WHEEZING OR SHORTNESS OF BREATH 20.1 g 1  ? cetirizine (ZYRTEC) 10 MG tablet TAKE ONE TABLET BY MOUTH EVERY DAY. 90 tablet 1  ? nitroGLYCERIN (NITROSTAT) 0.4 MG SL tablet Place 1 tablet (0.4 mg total) under the tongue every 5 (five) minutes as needed for chest pain. 25 tablet 3  ? potassium chloride (KLOR-CON) 10 MEQ tablet TAKE ONE TABLET BY MOUTH EVERY DAY 90 tablet 3  ? ?No facility-administered medications prior to visit.  ? ? ?Allergies  ?Allergen Reactions  ? Penicillins Anaphylaxis  ?  Has patient had a PCN reaction causing immediate rash, facial/tongue/throat swelling, SOB ?If all of the above answers are "NO", then may proceed with Cephalosporin use. ? ? or lightheadedness with hypotension:unsure ?Has patient had a PCN reaction causing severe rash involving mucus membranes or skin necrosis:unsure ?Has patient had a PCN reaction that required hospitalization:unsure ?Has patient had a PCN reaction occurring within the last 10 years:unsure ? ?Childhood reaction  ? ? ?ROS ?Review of Systems ? ?  ?Objective:  ?  ?Physical Exam ? ?BP 138/62 (BP Location: Left Arm, Patient Position: Sitting, Cuff Size: Large)   Pulse 67   Temp 98.2 ?F (36.8 ?C) (Oral)   Ht 5' 11"  (1.803 m)   Wt 250 lb 3.2 oz (113.5 kg)   SpO2 97%   BMI 34.90 kg/m?  ?Wt Readings from Last 3 Encounters:  ?03/01/21 250 lb 3.2 oz (113.5 kg)  ?02/22/21 247 lb 9.6 oz (112.3 kg)  ?01/04/21 244 lb 1.6 oz (110.7 kg)  ? ? ? ?Health Maintenance Due  ?Topic Date Due  ? COVID-19 Vaccine (1) Never done  ? Hepatitis C Screening  Never done  ? TETANUS/TDAP  Never done  ? Zoster Vaccines- Shingrix (1 of 2)  Never done  ? PAP SMEAR-Modifier  Never done  ? COLONOSCOPY (Pts 45-78yr Insurance coverage will need to be confirmed)  Never done  ? MAMMOGRAM  Never done  ? INFLUENZA VACCINE  Never done  ? ? ?There are no preventive care reminders to display for this patient. ? ?Lab Results  ?Component Value Date  ? TSH 0.290 (L) 02/22/2021  ? ?Lab Results  ?Component Value Date  ? WBC 5.9 01/04/2021  ? HGB 12.1 01/04/2021  ? HCT 37.7 01/04/2021  ? MCV 82 01/04/2021  ? PLT 225 01/04/2021  ? ?Lab Results  ?Component Value Date  ? NA 141 01/04/2021  ? K  4.3 01/04/2021  ? CO2 23 01/04/2021  ? GLUCOSE 100 (H) 01/04/2021  ? BUN 16 01/04/2021  ? CREATININE 0.79 01/04/2021  ? BILITOT 1.4 (H) 01/04/2021  ? ALKPHOS 101 01/04/2021  ? AST 23 01/04/2021  ? ALT 17 01/04/2021  ? PROT 7.0 01/04/2021  ? ALBUMIN 4.4 01/04/2021  ? CALCIUM 9.5 01/04/2021  ? ANIONGAP 8 04/16/2017  ? EGFR 85 01/04/2021  ? ?Lab Results  ?Component Value Date  ? CHOL 132 09/28/2020  ? ?Lab Results  ?Component Value Date  ? HDL 41 09/28/2020  ? ?Lab Results  ?Component Value Date  ? [place] 54 09/28/2020  ? ?Lab Results  ?Component Value Date  ? TRIG 228 (H) 09/28/2020  ? ?Lab Results  ?Component Value Date  ? CHOLHDL 3.6 08/05/2019  ? ?Lab Results  ?Component Value Date  ? HGBA1C 5.8 (H) 09/28/2020  ? ? ?  ?Assessment & Plan:  ? ?Problem List Items Addressed This Visit   ? ?  ? Cardiovascular and Mediastinum  ? Hypertension  ?  ? Other  ? Low TSH level - Primary  ? ? ?1. Low TSH level ?Thyroid scan uptake shows several cold nodules in her thyroid. Suspect multinodular goiter syndrome. Per radiologist suggestion will plan ultrasound.  ? ?Low TSH not easily explained with normal thyroid scan/uptake. Referring to endocrine clinic for assessment.  ? ?2. Hypertension, unspecified type ?Blood pressure at target. ? ? ?Follow-up: Follow-up in 3 weeks after ultrasound and endocrine consultation  ? ? ?Dede Query, CMA ?

## 2021-03-07 ENCOUNTER — Ambulatory Visit
Admission: RE | Admit: 2021-03-07 | Discharge: 2021-03-07 | Disposition: A | Discharge status: Home / Self Care | Payer: Self-pay | Source: Ambulatory Visit | Attending: Internal Medicine | Admitting: Internal Medicine

## 2021-03-07 ENCOUNTER — Ambulatory Visit: Payer: Self-pay | Admitting: Pharmacy Technician

## 2021-03-07 ENCOUNTER — Other Ambulatory Visit: Payer: Self-pay

## 2021-03-07 DIAGNOSIS — R7989 Other specified abnormal findings of blood chemistry: Secondary | ICD-10-CM | POA: Insufficient documentation

## 2021-03-07 DIAGNOSIS — Z79899 Other long term (current) drug therapy: Secondary | ICD-10-CM

## 2021-03-07 NOTE — Progress Notes (Signed)
Completed paperwork for re-certification.  Received updated proof of income.  Patient eligible to receive medication assistance at [facility] until time for re-certification in 6203, and as long as eligibility requirements continue to be met. ? ?Jacquelynn Cree ?Care Manager ?[facility]  ?

## 2021-03-13 ENCOUNTER — Other Ambulatory Visit: Payer: Self-pay

## 2021-03-21 ENCOUNTER — Ambulatory Visit: Payer: Self-pay

## 2021-03-22 ENCOUNTER — Ambulatory Visit: Payer: Self-pay | Admitting: Internal Medicine

## 2021-03-22 DIAGNOSIS — E049 Nontoxic goiter, unspecified: Secondary | ICD-10-CM

## 2021-03-22 DIAGNOSIS — I10 Essential (primary) hypertension: Secondary | ICD-10-CM

## 2021-03-22 DIAGNOSIS — R7989 Other specified abnormal findings of blood chemistry: Secondary | ICD-10-CM

## 2021-03-22 NOTE — Progress Notes (Signed)
? ?Established Patient Office Visit ? ?Subjective:  ?Patient ID: Lindsey Gibson, female    DOB: 24-Oct-1960  Age: 61 y.o. MRN: [id number] ? ?CC:  ?Chief Complaint  ?Patient presents with  ? Follow-up  ?  Follow up from thyroid ultrasound.  ? ? ?HPI ?Lindsey Gibson is a 61 y/o female who presents via telephone for review of thyroid scan and ultrasound. Explained the findings of the studies (low TSH, normal thyroid scan and uptake, multicystic goiter) and will refer her to [facility].  ? ?Past Medical History:  ?Diagnosis Date  ? Asthma   ? CAD (coronary artery disease)   ? a. 12/2016 NSTEMI/PCI: LM nl, LAD 30ost, 79m(IVUS->severe plaque burden->3.0 x 18 Sierra DES), D1/2/3 nl, LCX 30p, OM1/2/3 nl, RCA nl, RPDA/RPAV/RPL1/2 nl.  ? COPD [place]ch[place] obstructive [place] disease) ([place]   ? Hyperlipidemia   ? Hypertension   ? Ischemic cardiomyopathy   ? a. 12/2016 Echo: EF 20-25%, mid-apicalanteroseptal, ant, and apical AK, GR1 DD, triv AI, mildly dil LA; b. 04/2017 Echo: EF 50-55%, Gr2 DD, Nl RV fxn.  ? [place]   ? Myocardial infarction ([facility]   ? ? ?Past Surgical History:  ?Procedure Laterality Date  ? CORONARY STENT INTERVENTION N/A 12/27/2016  ? Procedure: CORONARY STENT INTERVENTION;  Surgeon: AWellington Hampshire MD;  [place];  Service: Cardiovascular;  Laterality: N/A;  ? INTRAVASCULAR ULTRASOUND/IVUS N/A 12/27/2016  ? Procedure: Intravascular Ultrasound/IVUS;  Surgeon: AWellington Hampshire MD;  [place];  Service: Cardiovascular;  Laterality: N/A;  ? KNEE ARTHROSCOPY Right   ? LEFT HEART CATH AND CORONARY ANGIOGRAPHY N/A 12/27/2016  ? Procedure: LEFT HEART CATH AND CORONARY ANGIOGRAPHY;  Surgeon: AWellington Hampshire MD;  [place];  Service: Cardiovascular;  Laterality: N/A;  ? SHOULDER ARTHROSCOPY WITH SUBACROMIAL DECOMPRESSION Right 11/22/2015  ? Procedure: SHOULDER ARTHROSCOPY WITH DEBRIDEMENT, DECOMPRESSION  AND BICEPS  TENODESIS;  Surgeon: JCorky Mull MD;  [facility]: [facility];  Service: Orthopedics;  Laterality: Right;  ? TONSILLECTOMY    ? ULNAR NERVE TRANSPOSITION Right 11/22/2015  ? Procedure: ULNAR NERVE DECOMPRESSION/TRANSPOSITION;  Surgeon: JCorky Mull MD;  Location: [facility];  Service: Orthopedics;  Laterality: Right;  ? ? ?Family History  ?Problem Relation Age of Onset  ? Heart disease Mother   ? Diabetes Mother   ? Diabetes Sister   ? ? ?Social History  ? ?Socioeconomic History  ? Marital status: Married  ?  Spouse name: Not on file  ? Number of children: Not on file  ? Years of education: Not on file  ? Highest education level: Not on file  ?Occupational History  ? Occupation: none  ?Tobacco Use  ? Smoking status: Former  ?  Packs/day: 1.00  ?  Years: 18.00  ?  Pack years: 18.00  ?  Types: Cigarettes  ? Smokeless tobacco: Never  ?Vaping Use  ? Vaping Use: Never used  ?Substance and Sexual Activity  ? Alcohol use: No  ? Drug use: No  ? Sexual activity: Not on file  ?Other Topics Concern  ? Not on file  ?Social History Narrative  ? Rents with family. Not on food stamps.  ? ?Social Determinants of Health  ? ?Financial Resource Strain: Not on file  ?Food Insecurity: Not on file  ?Transportation Needs: Not on file  ?Physical Activity: Not on file  ?Stress: Not on file  ?Social Connections: Not on file  ?Intimate Partner Violence: Not  on file  ? ? ?Outpatient Medications Prior to Visit  ?Medication Sig Dispense Refill  ? albuterol (VENTOLIN HFA) 108 (90 Base) MCG/ACT inhaler INHALE 1 PUFF INTO THE LUNGS EVERY 6 HOURS AS NEEDED FOR WHEEZING OR SHORTNESS OF BREATH 20.1 g 1  ? aspirin 81 MG chewable tablet CHEW 1 TABLET BY MOUTH ONCE EVERY DAY. 90 tablet 2  ? atorvastatin (LIPITOR) 80 MG tablet TAKE ONE TABLET BY MOUTH EVERY DAY 90 tablet 3  ? carvedilol (COREG) 3.125 MG tablet Take 1 tablet by mouth 2 (two) times daily with a meal. 180 tablet 3  ? cetirizine (ZYRTEC) 10 MG tablet TAKE ONE TABLET BY MOUTH EVERY DAY. 90 tablet  1  ? furosemide (LASIX) 20 MG tablet Take 1 tablet (20 mg total) by mouth once daily. 90 tablet 3  ? losartan (COZAAR) 50 MG tablet TAKE ONE TABLET BY MOUTH ONCE EVERY DAY 90 tablet 3  ? Multiple Vitamin (MULTIVITAMIN WITH MINERALS) TABS tablet Take 1 tablet by mouth daily.    ? nitroGLYCERIN (NITROSTAT) 0.4 MG SL tablet Place 1 tablet (0.4 mg total) under the tongue every 5 (five) minutes as needed for chest pain. 25 tablet 3  ? potassium chloride (KLOR-CON) 10 MEQ tablet TAKE ONE TABLET BY MOUTH EVERY DAY 90 tablet 3  ? spironolactone (ALDACTONE) 25 MG tablet TAKE ONE TABLET BY MOUTH EVERY DAY 90 tablet 3  ? ?No facility-administered medications prior to visit.  ? ? ?Allergies  ?Allergen Reactions  ? Penicillins Anaphylaxis  ?  Has patient had a PCN reaction causing immediate rash, facial/tongue/throat swelling, SOB ?If all of the above answers are "NO", then may proceed with Cephalosporin use. ? ? or lightheadedness with hypotension:unsure ?Has patient had a PCN reaction causing severe rash involving mucus membranes or skin necrosis:unsure ?Has patient had a PCN reaction that required hospitalization:unsure ?Has patient had a PCN reaction occurring within the last 10 years:unsure ? ?Childhood reaction  ? ? ?ROS ?Review of Systems ? ?  ?Objective:  ?  ?Physical Exam ? ?There were no vitals taken for this visit. ?Wt Readings from Last 3 Encounters:  ?03/01/21 250 lb 3.2 oz (113.5 kg)  ?02/22/21 247 lb 9.6 oz (112.3 kg)  ?01/04/21 244 lb 1.6 oz (110.7 kg)  ? ? ? ?Health Maintenance Due  ?Topic Date Due  ? COVID-19 Vaccine (1) Never done  ? Hepatitis C Screening  Never done  ? TETANUS/TDAP  Never done  ? Zoster Vaccines- Shingrix (1 of 2) Never done  ? PAP SMEAR-Modifier  Never done  ? COLONOSCOPY (Pts 45-58yr Insurance coverage will need to be confirmed)  Never done  ? MAMMOGRAM  Never done  ? INFLUENZA VACCINE  Never done  ? ? ?There are no preventive care reminders to display for this patient. ? ?Lab Results   ?Component Value Date  ? TSH 0.290 (L) 02/22/2021  ? ?Lab Results  ?Component Value Date  ? WBC 5.9 01/04/2021  ? HGB 12.1 01/04/2021  ? HCT 37.7 01/04/2021  ? MCV 82 01/04/2021  ? PLT 225 01/04/2021  ? ?Lab Results  ?Component Value Date  ? NA 141 01/04/2021  ? K 4.3 01/04/2021  ? CO2 23 01/04/2021  ? GLUCOSE 100 (H) 01/04/2021  ? BUN 16 01/04/2021  ? CREATININE 0.79 01/04/2021  ? BILITOT 1.4 (H) 01/04/2021  ? ALKPHOS 101 01/04/2021  ? AST 23 01/04/2021  ? ALT 17 01/04/2021  ? PROT 7.0 01/04/2021  ? ALBUMIN 4.4 01/04/2021  ? CALCIUM 9.5 01/04/2021  ?  ANIONGAP 8 04/16/2017  ? EGFR 85 01/04/2021  ? ?Lab Results  ?Component Value Date  ? CHOL 132 09/28/2020  ? ?Lab Results  ?Component Value Date  ? HDL 41 09/28/2020  ? ?Lab Results  ?Component Value Date  ? [place] 54 09/28/2020  ? ?Lab Results  ?Component Value Date  ? TRIG 228 (H) 09/28/2020  ? ?Lab Results  ?Component Value Date  ? CHOLHDL 3.6 08/05/2019  ? ?Lab Results  ?Component Value Date  ? HGBA1C 5.8 (H) 09/28/2020  ? ? ?  ?Assessment & Plan:  ? ?Problem List Items Addressed This Visit   ? ?  ? Cardiovascular and Mediastinum  ? Hypertension  ?  ? Endocrine  ? Thyroid goiter - Primary  ?  ? Other  ? Low TSH level  ? ? ?No orders of the defined types were placed in this encounter. ? ?1. 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 Patient has had both a thyroid uptake scan and follow up ultrasound due to low TSH and confirming a multicystic goiter. Radiology suggests some areas in the goiter that merit consideration of biopsy. Patient needs referral to endocrinologies. She qualifies for [facility]. Referring to [facility] facility. She is also pursuing [facility]. ? ?Will plan follow-up at [facility] April 19th.  ? ? ? ?Follow-up: No follow-ups on file.  ? ? ?Dede Query, CMA ?

## 2021-04-03 ENCOUNTER — Other Ambulatory Visit: Payer: Self-pay

## 2021-04-05 ENCOUNTER — Telehealth: Payer: Self-pay | Admitting: Nurse Practitioner

## 2021-04-05 ENCOUNTER — Ambulatory Visit (INDEPENDENT_AMBULATORY_CARE_PROVIDER_SITE_OTHER): Payer: Self-pay | Admitting: Nurse Practitioner

## 2021-04-05 ENCOUNTER — Encounter: Payer: Self-pay | Admitting: Nurse Practitioner

## 2021-04-05 VITALS — BP 127/78 | HR 70 | Ht 70.0 in | Wt 248.8 lb

## 2021-04-05 DIAGNOSIS — E042 Nontoxic multinodular goiter: Secondary | ICD-10-CM

## 2021-04-05 NOTE — Patient Instructions (Signed)
Thyroidectomy ?A thyroidectomy is a surgery that is done to remove the thyroid gland. The thyroid is a butterfly-shaped gland that is located at the lower front of your neck. It produces thyroid hormone, which is a substance that helps to control certain body processes. You may have a: ?Total thyroidectomy. All of your thyroid is removed. ?Thyroid lobectomy. Part of your thyroid is removed. ?The amount of thyroid gland tissue that is removed during your surgery depends on the reason for the procedure. Reasons to have this procedure include treatment for: ?Thyroid nodules. ?Thyroid cancer. ?Benign thyroid tumors. ?Goiter. ?Overactive thyroid gland (hyperthyroidism). ?There are two ways to do this procedure. Conventional, or open, thyroidectomy uses one large incision to remove the thyroid gland. This is the most common method. Endoscopic thyroidectomy, a less invasive method, uses a narrow tube with a light and camera (endoscope) to remove the gland. ?Tell a health care provider about: ?Any allergies you have. ?All medicines you are taking, including vitamins, herbs, eye drops, creams, and over-the-counter medicines. ?Any problems you or family members have had with anesthetic medicines. ?Any blood disorders you have. ?Any surgeries you have had. ?Any medical conditions you have. ?Whether you are pregnant or may be pregnant. ?What are the risks? ?Generally, this is a safe procedure. However, problems may occur, including: ?Damage to the parathyroid glands. These are located behind your thyroid gland. They maintain the calcium levels in the body. Damage may lead to: ?A decrease in parathyroid hormone levels (hypoparathyroidism). ?A decrease in calcium levels. This will make your nerves irritable and may cause muscle spasms. ?An increase in thyroid hormone. ?Damage to the nerves of your voice box (larynx). This can be temporary or long-term (rare). ?Hoarseness. This usually resolves in 24-48  hours. ?Bleeding. ?Infection. ?What happens before the procedure? ?Staying hydrated ?Follow instructions from your health care provider about hydration, which may include: ?Up to 2 hours before the procedure - you may continue to drink clear liquids, such as water, clear fruit juice, black coffee, and plain tea. ?Eating and drinking restrictions ?Follow instructions from your health care provider about eating and drinking, which may include: ?8 hours before the procedure - stop eating heavy meals or foods such as meat, fried foods, or fatty foods. ?6 hours before the procedure - stop eating light meals or foods, such as toast or cereal. ?6 hours before the procedure - stop drinking milk or drinks that contain milk. ?2 hours before the procedure - stop drinking clear liquids. ?Medicines ?Ask your health care provider about: ?Changing or stopping your regular medicines. This is especially important if you are taking diabetes medicines or blood thinners. ?Taking medicines such as aspirin and ibuprofen. These medicines can thin your blood. Do not take these medicines unless your health care provider tells you to take them. ?Taking over-the-counter medicines, vitamins, herbs, and supplements. ?General instructions ?You may be asked to shower with a germ-killing soap. ?Plan to have someone take you home from the hospital or clinic. ?Plan to have a responsible adult care for you for at least 24 hours after you leave the hospital or clinic. This is important. ?What happens during the procedure? ?To reduce your risk of infection: ?Your health care team will wash or sanitize their hands. ?Hair may be removed from the surgical area. ?Your skin will be washed with soap. ?An IV will be inserted into one of your veins. ?You will be given one or more of the following: ?A medicine to help you relax (sedative). ?A medicine   to make you fall asleep (general anesthetic). ?Your health care provider will perform your surgery using one of  two methods: ?For open thyroidectomy, an incision will be made in your lower neck. Muscles in the area will be separated to reveal your thyroid gland. ?For endoscopic thyroidectomy, several small incisions will be made in your neck, chest, or armpit. An endoscopewill be inserted into an incision. ?Your health care provider may monitor laryngeal nerve function during the procedure for safety reasons. ?Part or all of your thyroid gland will be removed. ?A tube (drain) may be placed at the incision site to drain blood and fluids that accumulate under the skin after the procedure. The drain may have to stay in place for a day or two after the procedure. ?The incision will be closed with stitches (sutures). ?A dressing will be placed over your incision. ?The procedure may vary among health care providers and hospitals. ?What happens after the procedure? ?Your blood pressure, heart rate, breathing rate, and blood oxygen level will be monitored often until the medicines you were given have worn off. ?You will be given pain medicine as needed. ?Your provider will check your ability to talk and swallow after the procedure. ?You will gradually start to drink liquids and have soft foods as tolerated. ?You may have a blood test to check the level of calcium in your body. ?If you had a drain put in during the procedure, it will usually be removed the next day. ?Summary ?A thyroidectomy is a surgery that is done to remove the thyroid gland. ?The procedure will be done in one of two ways: conventional, or open, thyroidectomy or endoscopic thyroidectomy. ?Serious complications are rare. ?Plan to have a responsible adult care for you for at least 24 hours after you leave the hospital or clinic. This is important. ?This information is not intended to replace advice given to you by your health care provider. Make sure you discuss any questions you have with your health care provider. ?Document Revised: 08/27/2019 Document Reviewed:  08/27/2019 ?Elsevier Patient Education ? 2022 [employer] ? ?

## 2021-04-05 NOTE — Telephone Encounter (Signed)
Patient is new to [facility] Endocr[facility]. Patient states she has [facility] to cover her visits but did not have proof of that on paper for [place] to scan in chart. ?

## 2021-04-05 NOTE — Progress Notes (Signed)
?Endocrinology Consult Note ?04/05/21  ? ? ?---------------------------------------------------------------------------------------------------------------------- ?Subjective   ? ?Past Medical History:  ?Diagnosis Date  ? Asthma   ? CAD (coronary artery disease)   ? a. 12/2016 NSTEMI/PCI: LM nl, LAD 30ost, 68m (IVUS->severe plaque burden->3.0 x 18 Sierra DES), D1/2/3 nl, LCX 30p, OM1/2/3 nl, RCA nl, RPDA/RPAV/RPL1/2 nl.  ? COPD (chronic obstructive pulmonary disease) (HCC)   ? Hyperlipidemia   ? Hypertension   ? Ischemic cardiomyopathy   ? a. 12/2016 Echo: EF 20-25%, mid-apicalanteroseptal, ant, and apical AK, GR1 DD, triv AI, mildly dil LA; b. 04/2017 Echo: EF 50-55%, Gr2 DD, Nl RV fxn.  ? Morbid obesity (HCC)   ? Myocardial infarction [facility])   ?  ?Past Surgical History:  ?Procedure Laterality Date  ? CORONARY STENT INTERVENTION N/A 12/27/2016  ? Procedure: CORONARY STENT INTERVENTION;  Surgeon: Iran Ouch, MD;  Location: [facility] INVASIVE CV LAB;  Service: Cardiovascular;  Laterality: N/A;  ? INTRAVASCULAR ULTRASOUND/IVUS N/A 12/27/2016  ? Procedure: Intravascular Ultrasound/IVUS;  Surgeon: Iran Ouch, MD;  Location: [facility] INVASIVE CV LAB;  Service: Cardiovascular;  Laterality: N/A;  ? KNEE ARTHROSCOPY Right   ? LEFT HEART CATH AND CORONARY ANGIOGRAPHY N/A 12/27/2016  ? Procedure: LEFT HEART CATH AND CORONARY ANGIOGRAPHY;  Surgeon: Iran Ouch, MD;  Location: [facility] INVASIVE CV LAB;  Service: Cardiovascular;  Laterality: N/A;  ? SHOULDER ARTHROSCOPY WITH SUBACROMIAL DECOMPRESSION Right 11/22/2015  ? Procedure: SHOULDER ARTHROSCOPY WITH DEBRIDEMENT, DECOMPRESSION  AND BICEPS TENODESIS;  Surgeon: Christena Flake, MD;  Location: [facility] ORS;  Service: Orthopedics;  Laterality: Right;  ? TONSILLECTOMY    ? ULNAR NERVE TRANSPOSITION Right 11/22/2015  ? Procedure: ULNAR NERVE DECOMPRESSION/TRANSPOSITION;  Surgeon: Christena Flake, MD;  Location: [facility] ORS;  Service: Orthopedics;  Laterality: Right;  ?  ?Social History   ? ?Socioeconomic History  ? Marital status: Married  ?  Spouse name: Not on file  ? Number of children: Not on file  ? Years of education: Not on file  ? Highest education level: Not on file  ?Occupational History  ? Occupation: none  ?Tobacco Use  ? Smoking status: Former  ?  Packs/day: 1.00  ?  Years: 18.00  ?  Pack years: 18.00  ?  Types: Cigarettes  ? Smokeless tobacco: Never  ?Vaping Use  ? Vaping Use: Never used  ?Substance and Sexual Activity  ? Alcohol use: No  ? Drug use: No  ? Sexual activity: Not on file  ?Other Topics Concern  ? Not on file  ?Social History Narrative  ? Rents with family. Not on food stamps.  ? ?Social Determinants of Health  ? ?Financial Resource Strain: Not on file  ?Food Insecurity: Not on file  ?Transportation Needs: Not on file  ?Physical Activity: Not on file  ?Stress: Not on file  ?Social Connections: Not on file  ?Intimate Partner Violence: Not on file  ?  ?Current Outpatient Medications on File Prior to Visit  ?Medication Sig Dispense Refill  ? aspirin 81 MG chewable tablet CHEW 1 TABLET BY MOUTH ONCE EVERY DAY. 90 tablet 2  ? atorvastatin (LIPITOR) 80 MG tablet TAKE ONE TABLET BY MOUTH EVERY DAY 90 tablet 3  ? carvedilol (COREG) 3.125 MG tablet Take 1 tablet by mouth 2 (two) times daily with a meal. 180 tablet 3  ? furosemide (LASIX) 20 MG tablet Take 1 tablet (20 mg total) by mouth once daily. 90 tablet 3  ? losartan (COZAAR) 50 MG tablet TAKE ONE TABLET BY MOUTH ONCE  EVERY DAY 90 tablet 3  ? Multiple Vitamin (MULTIVITAMIN WITH MINERALS) TABS tablet Take 1 tablet by mouth daily.    ? spironolactone (ALDACTONE) 25 MG tablet TAKE ONE TABLET BY MOUTH EVERY DAY 90 tablet 3  ? albuterol (VENTOLIN HFA) 108 (90 Base) MCG/ACT inhaler INHALE 1 PUFF INTO THE LUNGS EVERY 6 HOURS AS NEEDED FOR WHEEZING OR SHORTNESS OF BREATH 20.1 g 1  ? cetirizine (ZYRTEC) 10 MG tablet TAKE ONE TABLET BY MOUTH EVERY DAY. 90 tablet 1  ? nitroGLYCERIN (NITROSTAT) 0.4 MG SL tablet Place 1 tablet (0.4 mg  total) under the tongue every 5 (five) minutes as needed for chest pain. 25 tablet 3  ? potassium chloride (KLOR-CON) 10 MEQ tablet TAKE ONE TABLET BY MOUTH EVERY DAY 90 tablet 3  ? ?No current facility-administered medications on file prior to visit.  ?  ? ? ?HPI  ? ?Lindsey Gibson is a 61 y.o.-year-old female, referred by her PCP, Dr.Chaplin, for evaluation for multinodular goiter and suppressed TSH (suggestive of hyperthyroidism). ? ?Thyroid U/S: shows enlarged multinodular thyroid with 4+ moderately suspicious nodules recommending biopsy. ? ?She also had uptake and scan done which was WNL. ? ? ?I reviewed pt's thyroid tests: ?Lab Results  ?Component Value Date  ? TSH 0.290 (L) 02/22/2021  ? TSH 0.280 (L) 01/04/2021  ? TSH 0.297 (L) 09/28/2020  ? TSH 0.292 (L) 06/29/2020  ? TSH 0.731 08/05/2019  ? TSH 0.751 05/27/2019  ? TSH 0.668 05/28/2018  ? TSH 0.714 07/10/2017  ?  ? ?Pt denies: ?- fatigue ?- heat intolerance/cold intolerance ?- tremors ?- palpitations ?- anxiety/depression ?- hyperdefecation/constipation ?- weight loss/weight gain ?- dry skin ?- hair loss ? ?Pt complains ?- feeling nodules in neck- tender to palpation ?- hoarseness ?- dysphagia ?- choking ? ?No FH of thyroid ds. No FH of thyroid cancer. No h/o radiation tx to head or neck. ? ?No seaweed or kelp. No recent contrast studies. No steroid use. No herbal supplements. No Biotin supplements or Hair, Skin and Nails vitamins. ? ?Pt also has a history of HTN, COPD, MI. ? ?Review of systems ? ?Constitutional: + Minimally fluctuating body weight,  current Body mass index is 35.7 kg/m?. , no fatigue, no subjective hyperthermia, no subjective hypothermia ?Eyes: no blurry vision, no xerophthalmia ?ENT: no sore throat, + nodules palpated in throat, + dysphagia/odynophagia, + hoarseness ?Cardiovascular: no chest pain, no shortness of breath, no palpitations, no leg swelling ?Respiratory: no cough, no shortness of breath ?Gastrointestinal: no  nausea/vomiting/diarrhea ?Musculoskeletal: no muscle/joint aches ?Skin: no rashes, no hyperemia ?Neurological: no tremors, no numbness, no tingling, no dizziness ?Psychiatric: no depression, no anxiety ? ?---------------------------------------------------------------------------------------------------------------------- ?Objective   ? ?BP 127/78   Pulse 70   Ht 5\' 10"  (1.778 m)   Wt 248 lb 12.8 oz (112.9 kg)   SpO2 97%   BMI 35.70 kg/m?   ? ?BP Readings from Last 3 Encounters:  ?04/05/21 127/78  ?03/01/21 138/62  ?02/22/21 128/79  ? ? ?Wt Readings from Last 3 Encounters:  ?04/05/21 248 lb 12.8 oz (112.9 kg)  ?03/01/21 250 lb 3.2 oz (113.5 kg)  ?02/22/21 247 lb 9.6 oz (112.3 kg)  ? ? ? ?Physical Exam- Limited ? ?Constitutional:  Body mass index is 35.7 kg/m?. , not in acute distress, normal state of mind ?Eyes:  EOMI, no exophthalmos ?Neck: Supple ?Thyroid: ++ palpable nodular goiter bilaterally ?Cardiovascular: RRR, no murmurs, rubs, or gallops, no edema ?Respiratory: Adequate breathing efforts, no crackles, rales, rhonchi, or wheezing ?Musculoskeletal: no  gross deformities, strength intact in all four extremities, no gross restriction of joint movements ?Skin:  no rashes, no hyperemia ?Neurological: no tremor with outstretched hands ? ? Latest Reference Range & Units 06/29/20 09:48 09/28/20 09:59 01/04/21 09:47 02/22/21 09:34  ?TSH 0.450 - 4.500 uIU/mL 0.292 (L) 0.297 (L) 0.280 (L) 0.290 (L)  ?Triiodothyronine (T3) 71 - 180 ng/dL    726  ?Thyroxine (T4) 4.5 - 12.0 ug/dL  8.8    ?Free Thyroxine Index 1.2 - 4.9   2.3    ?T3 Uptake Ratio 24 - 39 %  26    ?(L): Data is abnormally low ? ------------------------------------------------------------------------------------------------------------------------ ?Uptake and Scan from 02/17/21 ?CLINICAL DATA:  Suppressed TSH, weight loss, increased nervousness ?  ?EXAM: ?THYROID SCAN AND UPTAKE - 4 AND 24 HOURS ?  ?TECHNIQUE: ?Following oral administration of I-123  capsule, anterior planar ?imaging was acquired at 24 hours. Thyroid uptake was calculated with ?a thyroid probe at 4-6 hours and 24 hours. ?  ?RADIOPHARMACEUTICALS:  293.2 uCi I-123 sodium iodide p.o. ?  ?COMPARISON:  None ?  ?

## 2021-04-11 ENCOUNTER — Telehealth: Payer: Self-pay | Admitting: Nurse Practitioner

## 2021-04-11 NOTE — Telephone Encounter (Signed)
Yes, Selena Batten was working on it last I heard.  Apparently she needs an experienced surgeon within [facility], she was working on finding one.

## 2021-04-11 NOTE — Telephone Encounter (Signed)
Please see message. Thanks!

## 2021-04-11 NOTE — Telephone Encounter (Signed)
Pt called and states she was seen last week and was told [facility] would be reaching out to her to set up an appt. She is requesting a call back [phone] ?

## 2021-04-11 NOTE — Telephone Encounter (Signed)
Is there an order for this?

## 2021-04-12 NOTE — Telephone Encounter (Signed)
Pt was referred to [facility] surgical. She was originally referred to [facility] surgical but they no longer have a surgeon that will perform thyroid surgery. Pt was notified. Original notes within the referral. Will call pt again to discuss why she cannot have this done at [facility].  ?

## 2021-04-19 ENCOUNTER — Ambulatory Visit: Payer: Self-pay | Admitting: Internal Medicine

## 2021-04-19 ENCOUNTER — Encounter: Payer: Self-pay | Admitting: Internal Medicine

## 2021-04-19 DIAGNOSIS — I10 Essential (primary) hypertension: Secondary | ICD-10-CM

## 2021-04-19 DIAGNOSIS — E049 Nontoxic goiter, unspecified: Secondary | ICD-10-CM

## 2021-04-19 DIAGNOSIS — I214 Non-ST elevation (NSTEMI) myocardial infarction: Secondary | ICD-10-CM

## 2021-04-19 NOTE — Progress Notes (Signed)
? ?  Established Patient Office Visit ? ?Subjective   ?Patient ID: Lindsey Gibson, female    DOB: [birth year]  Age: 61 y.o. MRN: [id number] ? ?Chief Complaint  ?Patient presents with  ? Follow-up  ? ? ?HPI ? ?Lindsey Gibson is a 61 y/o female who presents for follow up from endocrinology referral. Patient was seen 04/05/2021 at [facility] where a thyroidectomy was recommended. Currently searching to find someone to perform her surgery. She has a general surgery appointment 04/19/2021 and a follow-up with endocrinology 05/10/2021. ? ?ROS ? ?  ?Objective:  ?  ? ?There were no vitals taken for this visit. ? ? ?Physical Exam ? ? ?No results found for any visits on 04/19/21. ? ? ? ?The ASCVD Risk score (Arnett DK, et al., 2019) failed to calculate for the following reasons: ?  The patient has a prior MI or stroke diagnosis ? ?  ?Assessment & Plan:  ? ?Problem List Items Addressed This Visit   ? ?  ? Endocrine  ? Thyroid goiter - Primary  ? ? ?1. Thyroid goiter ?Multinodular goiter recently evaluated by endocrinologist. Being referred to general surgery for a thyroidectomy. Would expect endocrinologist and general surgeon to work closely together in the post-op intervention. Follow-up in June with comprehensive labs. ? ?2. NSTEMI (non-ST elevated myocardial infarction) ([place]) ?Clinically has been stable. Her annual follow-up with her cardiologist is to be scheduled in July. No medication changes ? ?3. Hypertension, unspecified type ?Blood pressure appears to be at target on current medication. No changes. ? ? ?Return in about 2 months (around 06/19/2021), or if symptoms worsen or fail to improve.  ? ? ?Dede Query, CMA ? ?

## 2021-04-20 ENCOUNTER — Encounter: Payer: Self-pay | Admitting: General Surgery

## 2021-04-20 ENCOUNTER — Other Ambulatory Visit: Payer: Self-pay

## 2021-04-20 ENCOUNTER — Ambulatory Visit (INDEPENDENT_AMBULATORY_CARE_PROVIDER_SITE_OTHER): Payer: Self-pay | Admitting: General Surgery

## 2021-04-20 VITALS — BP 129/81 | HR 65 | Temp 98.5°F | Resp 16 | Ht 70.0 in | Wt 251.0 lb

## 2021-04-20 DIAGNOSIS — E042 Nontoxic multinodular goiter: Secondary | ICD-10-CM

## 2021-04-20 NOTE — H&P (Signed)
Lindsey Gibson; [id number]; 08/28/1960 ? ? ?HPI ?Patient is a 60-year-old white female who was referred to my care by both Dr. Nida of endocrinology and Dr. Chaplin for evaluation and treatment of a multinodular goiter.  Patient has had increasing pressure sensation in her neck which causes some dysphagia.  She denies any voice changes.  She denies any heat tolerance.  She has had an extensive work-up including a thyroid scan and ultrasound of the thyroid which reveals a multinodular goiter.  She has not had biopsy of the nodules as it was recommended that she undergo a total thyroidectomy.  There is no history of neck radiation or family history of thyroid cancer.  She denies any heart palpitations. ?Past Medical History:  ?Diagnosis Date  ? Asthma   ? CAD (coronary artery disease)   ? a. 12/2016 NSTEMI/PCI: LM nl, LAD 30ost, 70m (IVUS->severe plaque burden->3.0 x 18 Sierra DES), D1/2/3 nl, LCX 30p, OM1/2/3 nl, RCA nl, RPDA/RPAV/RPL1/2 nl.  ? COPD (chronic obstructive pulmonary disease) (HCC)   ? Hyperlipidemia   ? Hypertension   ? Ischemic cardiomyopathy   ? a. 12/2016 Echo: EF 20-25%, mid-apicalanteroseptal, ant, and apical AK, GR1 DD, triv AI, mildly dil LA; b. 04/2017 Echo: EF 50-55%, Gr2 DD, Nl RV fxn.  ? Morbid obesity (HCC)   ? Myocardial infarction (HCC)   ? ? ?Past Surgical History:  ?Procedure Laterality Date  ? CORONARY STENT INTERVENTION N/A 12/27/2016  ? Procedure: CORONARY STENT INTERVENTION;  Surgeon: Arida, Muhammad A, MD;  Location: [facility] INVASIVE CV LAB;  Service: Cardiovascular;  Laterality: N/A;  ? INTRAVASCULAR ULTRASOUND/IVUS N/A 12/27/2016  ? Procedure: Intravascular Ultrasound/IVUS;  Surgeon: Arida, Muhammad A, MD;  Location: [facility] INVASIVE CV LAB;  Service: Cardiovascular;  Laterality: N/A;  ? KNEE ARTHROSCOPY Right   ? LEFT HEART CATH AND CORONARY ANGIOGRAPHY N/A 12/27/2016  ? Procedure: LEFT HEART CATH AND CORONARY ANGIOGRAPHY;  Surgeon: Arida, Muhammad A, MD;  Location: [facility] INVASIVE CV  LAB;  Service: Cardiovascular;  Laterality: N/A;  ? SHOULDER ARTHROSCOPY WITH SUBACROMIAL DECOMPRESSION Right 11/22/2015  ? Procedure: SHOULDER ARTHROSCOPY WITH DEBRIDEMENT, DECOMPRESSION  AND BICEPS TENODESIS;  Surgeon: John J Poggi, MD;  Location: [facility] ORS;  Service: Orthopedics;  Laterality: Right;  ? TONSILLECTOMY    ? ULNAR NERVE TRANSPOSITION Right 11/22/2015  ? Procedure: ULNAR NERVE DECOMPRESSION/TRANSPOSITION;  Surgeon: John J Poggi, MD;  Location: [facility] ORS;  Service: Orthopedics;  Laterality: Right;  ? ? ?Family History  ?Problem Relation Age of Onset  ? Hypertension Mother   ? Heart disease Mother   ? Diabetes Mother   ? Diabetes Sister   ? ? ?Current Outpatient Medications on File Prior to Visit  ?Medication Sig Dispense Refill  ? aspirin 81 MG chewable tablet CHEW 1 TABLET BY MOUTH ONCE EVERY DAY. 90 tablet 2  ? atorvastatin (LIPITOR) 80 MG tablet TAKE ONE TABLET BY MOUTH EVERY DAY 90 tablet 3  ? carvedilol (COREG) 3.125 MG tablet Take 1 tablet by mouth 2 (two) times daily with a meal. 180 tablet 3  ? cetirizine (ZYRTEC) 10 MG tablet TAKE ONE TABLET BY MOUTH EVERY DAY. 90 tablet 1  ? furosemide (LASIX) 20 MG tablet Take 1 tablet (20 mg total) by mouth once daily. 90 tablet 3  ? losartan (COZAAR) 50 MG tablet TAKE ONE TABLET BY MOUTH ONCE EVERY DAY 90 tablet 3  ? Multiple Vitamin (MULTIVITAMIN WITH MINERALS) TABS tablet Take 1 tablet by mouth daily.    ? spironolactone (ALDACTONE) 25 MG tablet TAKE   ONE TABLET BY MOUTH EVERY DAY 90 tablet 3  ? albuterol (VENTOLIN HFA) 108 (90 Base) MCG/ACT inhaler INHALE 1 PUFF INTO THE LUNGS EVERY 6 HOURS AS NEEDED FOR WHEEZING OR SHORTNESS OF BREATH 20.1 g 1  ? nitroGLYCERIN (NITROSTAT) 0.4 MG SL tablet Place 1 tablet (0.4 mg total) under the tongue every 5 (five) minutes as needed for chest pain. 25 tablet 3  ? ?No current facility-administered medications on file prior to visit.  ? ? ?Allergies  ?Allergen Reactions  ? Penicillins Anaphylaxis  ?  Has patient had a PCN  reaction causing immediate rash, facial/tongue/throat swelling, SOB ?If all of the above answers are "NO", then may proceed with Cephalosporin use. ? ? or lightheadedness with hypotension:unsure ?Has patient had a PCN reaction causing severe rash involving mucus membranes or skin necrosis:unsure ?Has patient had a PCN reaction that required hospitalization:unsure ?Has patient had a PCN reaction occurring within the last 10 years:unsure ? ?Childhood reaction  ? ? ?Social History  ? ?Substance and Sexual Activity  ?Alcohol Use No  ? ? ?Social History  ? ?Tobacco Use  ?Smoking Status Former  ? Packs/day: 1.00  ? Years: 18.00  ? Pack years: 18.00  ? Types: Cigarettes  ?Smokeless Tobacco Never  ? ? ?Review of Systems  ?Constitutional: Negative.   ?HENT: Negative.    ?Eyes: Negative.   ?Respiratory: Negative.    ?Cardiovascular: Negative.   ?Gastrointestinal: Negative.   ?Genitourinary: Negative.   ?Musculoskeletal:  Positive for joint pain and neck pain.  ?Skin: Negative.   ?Neurological: Negative.   ?Endo/Heme/Allergies: Negative.   ?Psychiatric/Behavioral: Negative.    ? ?Objective  ? ?Vitals:  ? 04/20/21 1054  ?BP: 129/81  ?Pulse: 65  ?Resp: 16  ?Temp: 98.5 ?F (36.9 ?C)  ?SpO2: 96%  ? ? ?Physical Exam ?Vitals reviewed.  ?Constitutional:   ?   Appearance: Normal appearance. She is obese. She is not ill-appearing.  ?HENT:  ?   Head: Normocephalic and atraumatic.  ?Neck:  ?   Comments: Enlarged lobular thyroid gland, left greater than right.  Some tenderness along the right lobe of the thyroid gland is noted.  No lymphadenopathy or supracervical lymphadenopathy is noted. ?Cardiovascular:  ?   Rate and Rhythm: Normal rate and regular rhythm.  ?   Heart sounds: Normal heart sounds. No murmur heard. ?  No friction rub. No gallop.  ?Pulmonary:  ?   Effort: Pulmonary effort is normal. No respiratory distress.  ?   Breath sounds: Normal breath sounds. No stridor. No wheezing, rhonchi or rales.  ?Musculoskeletal:  ?   Cervical  back: Normal range of motion and neck supple.  ?Lymphadenopathy:  ?   Cervical: No cervical adenopathy.  ?Skin: ?   General: Skin is warm and dry.  ?Neurological:  ?   Mental Status: She is alert and oriented to person, place, and time.  ?NM THYROID MULT UPTAKE W/IMAGING (Accession [id number]) (Order [id number]) ?Imaging ?Date: 02/16/2021 Department: [facility] NUCLEAR MEDICINE Released By: Dickerson, Sierra C Authorizing: Chaplin, Don C, MD  ? ?Exam Status ? ?Status  ?Final [99]  ? ?PACS Intelerad Image Link ? ? Show images for NM THYROID MULT UPTAKE W/IMAGING ? ?Study Result ? ?Narrative & Impression  ?CLINICAL DATA:  Suppressed TSH, weight loss, increased nervousness ?  ?EXAM: ?THYROID SCAN AND UPTAKE - 4 AND 24 HOURS ?  ?TECHNIQUE: ?Following oral administration of I-123 capsule, anterior planar ?imaging was acquired at 24 hours. Thyroid uptake was calculated with ?a thyroid   probe at 4-6 hours and 24 hours. ?  ?RADIOPHARMACEUTICALS:  293.2 uCi I-123 sodium iodide p.o. ?  ?COMPARISON:  None ?  ?FINDINGS: ?Thyroid lobes appear enlarged bilaterally. ?  ?Both lobes demonstrate large cold masses involving the mid to ?inferior lobes. ?  ?4 hour I-123 uptake = 5.5% (normal 5-20%) ?  ?24 hour I-123 uptake = 18.4% (normal 10-30%) ?  ?IMPRESSION: ?Normal 4 hour and 24 hour radio iodine uptakes. ?  ?Large cold masses within the mid to inferior thyroid lobes ?bilaterally; thyroid ultrasound assessment recommended to exclude ?neoplasm. ?  ?  ?Electronically Signed ?  By: Beaulah Romanek  Boles M.D. ?  On: 02/17/2021 10:35 ?  ?  ? ?Result History ? ?NM THYROID MULT UPTAKE W/IMAGING (Order #[id number]) on 02/17/2021 - Order Result History Report ? ?MyChart Results Release ? ?MyChart Status: Active  Results Release  ? ? ? ?Encounter-Level Documents on 02/16/2021: ? ?Electronic signature on 02/12/2021 12:20 AM - 1 of 3 e-signatures recorded ?  ?   ? ?Order-Level Documents: ? ?There are no order-level documents. ? ?Hospital  Account-Level Documents: ? ?There are no hospital account-level documents. ? ?Vitals ? ?Height Weight BMI (Calculated)  ?5' 11" (1.803 m) 250 lb 3.2 oz (113.5 kg) 34.91  ? ?Assessment/Recommendation ? ?

## 2021-04-20 NOTE — Patient Instructions (Signed)
Stop aspirin.

## 2021-04-20 NOTE — Progress Notes (Addendum)
Lindsey Gibson; [id number]; 11-Jan-1960 ? ? ?HPI ?Patient is a 61 year old white female who was referred to my care by both Dr. Dorris Fetch of endocrinology and Dr. Mable Fill for evaluation and treatment of a multinodular goiter.  Patient has had increasing pressure sensation in her neck which causes some dysphagia.  She denies any voice changes.  She denies any heat tolerance.  She has had an extensive work-up including a thyroid scan and ultrasound of the thyroid which reveals a multinodular goiter.  She has not had biopsy of the nodules as it was recommended that she undergo a total thyroidectomy.  There is no history of neck radiation or family history of thyroid cancer.  She denies any heart palpitations. ?Past Medical History:  ?Diagnosis Date  ? Asthma   ? CAD (coronary artery disease)   ? a. 12/2016 NSTEMI/PCI: LM nl, LAD 30ost, 1m (IVUS->severe plaque burden->3.0 x 18 [place] DES), D1/2/3 nl, LCX 30p, OM1/2/3 nl, RCA nl, RPDA/RPAV/RPL1/2 nl.  ? COPD (chronic obstructive pulmonary disease) ([place])   ? Hyperlipidemia   ? Hypertension   ? Ischemic cardiomyopathy   ? a. 12/2016 Echo: EF 20-25%, mid-apicalanteroseptal, ant, and apical AK, GR1 DD, triv AI, mildly dil LA; b. 04/2017 Echo: EF 50-55%, Gr2 DD, Nl RV fxn.  ? Morbid obesity ([place])   ? Myocardial infarction [facility])   ? ? ?Past Surgical History:  ?Procedure Laterality Date  ? CORONARY STENT INTERVENTION N/A 12/27/2016  ? Procedure: CORONARY STENT INTERVENTION;  Surgeon: Wellington Hampshire, MD;  Location: [place] [facility];  Service: Cardiovascular;  Laterality: N/A;  ? INTRAVASCULAR ULTRASOUND/IVUS N/A 12/27/2016  ? Procedure: Intravascular Ultrasound/IVUS;  Surgeon: Wellington Hampshire, MD;  Location: [place] [facility];  Service: Cardiovascular;  Laterality: N/A;  ? KNEE ARTHROSCOPY Right   ? LEFT HEART CATH AND CORONARY ANGIOGRAPHY N/A 12/27/2016  ? Procedure: LEFT HEART CATH AND CORONARY ANGIOGRAPHY;  Surgeon: Wellington Hampshire, MD;  Location: [place] [facility];  Service: Cardiovascular;  Laterality: N/A;  ? SHOULDER ARTHROSCOPY WITH SUBACROMIAL DECOMPRESSION Right 11/22/2015  ? Procedure: SHOULDER ARTHROSCOPY WITH DEBRIDEMENT, DECOMPRESSION  AND BICEPS TENODESIS;  Surgeon: Corky Mull, MD;  Location: [facility];  Service: Orthopedics;  Laterality: Right;  ? TONSILLECTOMY    ? ULNAR NERVE TRANSPOSITION Right 11/22/2015  ? Procedure: ULNAR NERVE DECOMPRESSION/TRANSPOSITION;  Surgeon: Corky Mull, MD;  Location: [facility];  Service: Orthopedics;  Laterality: Right;  ? ? ?Family History  ?Problem Relation Age of Onset  ? Hypertension Mother   ? Heart disease Mother   ? Diabetes Mother   ? Diabetes Sister   ? ? ?Current Outpatient Medications on File Prior to Visit  ?Medication Sig Dispense Refill  ? aspirin 81 MG chewable tablet CHEW 1 TABLET BY MOUTH ONCE EVERY DAY. 90 tablet 2  ? atorvastatin (LIPITOR) 80 MG tablet TAKE ONE TABLET BY MOUTH EVERY DAY 90 tablet 3  ? carvedilol (COREG) 3.125 MG tablet Take 1 tablet by mouth 2 (two) times daily with a meal. 180 tablet 3  ? cetirizine (ZYRTEC) 10 MG tablet TAKE ONE TABLET BY MOUTH EVERY DAY. 90 tablet 1  ? furosemide (LASIX) 20 MG tablet Take 1 tablet (20 mg total) by mouth once daily. 90 tablet 3  ? losartan (COZAAR) 50 MG tablet TAKE ONE TABLET BY MOUTH ONCE EVERY DAY 90 tablet 3  ? Multiple Vitamin (MULTIVITAMIN WITH MINERALS) TABS tablet Take 1 tablet by mouth daily.    ? spironolactone (ALDACTONE) 25 MG tablet TAKE  ONE TABLET BY MOUTH EVERY DAY 90 tablet 3  ? albuterol (VENTOLIN HFA) 108 (90 Base) MCG/ACT inhaler INHALE 1 PUFF INTO THE LUNGS EVERY 6 HOURS AS NEEDED FOR WHEEZING OR SHORTNESS OF BREATH 20.1 g 1  ? nitroGLYCERIN (NITROSTAT) 0.4 MG SL tablet Place 1 tablet (0.4 mg total) under the tongue every 5 (five) minutes as needed for chest pain. 25 tablet 3  ? ?No current facility-administered medications on file prior to visit.  ? ? ?Allergies  ?Allergen Reactions  ? Penicillins Anaphylaxis  ?  Has patient had a PCN  reaction causing immediate rash, facial/tongue/throat swelling, SOB ?If all of the above answers are "NO", then may proceed with Cephalosporin use. ? ? or lightheadedness with hypotension:unsure ?Has patient had a PCN reaction causing severe rash involving mucus membranes or skin necrosis:unsure ?Has patient had a PCN reaction that required hospitalization:unsure ?Has patient had a PCN reaction occurring within the last 10 years:unsure ? ?Childhood reaction  ? ? ?Social History  ? ?Substance and Sexual Activity  ?Alcohol Use No  ? ? ?Social History  ? ?Tobacco Use  ?Smoking Status Former  ? Packs/day: 1.00  ? Years: 18.00  ? Pack years: 18.00  ? Types: Cigarettes  ?Smokeless Tobacco Never  ? ? ?Review of Systems  ?Constitutional: Negative.   ?HENT: Negative.    ?Eyes: Negative.   ?Respiratory: Negative.    ?Cardiovascular: Negative.   ?Gastrointestinal: Negative.   ?Genitourinary: Negative.   ?Musculoskeletal:  Positive for joint pain and neck pain.  ?Skin: Negative.   ?Neurological: Negative.   ?Endo/Heme/Allergies: Negative.   ?Psychiatric/Behavioral: Negative.    ? ?Objective  ? ?Vitals:  ? 04/20/21 1054  ?BP: 129/81  ?Pulse: 65  ?Resp: 16  ?Temp: 98.5 ?F (36.9 ?C)  ?SpO2: 96%  ? ? ?Physical Exam ?Vitals reviewed.  ?Constitutional:   ?   Appearance: Normal appearance. She is obese. She is not ill-appearing.  ?HENT:  ?   Head: Normocephalic and atraumatic.  ?Neck:  ?   Comments: Enlarged lobular thyroid gland, left greater than right.  Some tenderness along the right lobe of the thyroid gland is noted.  No lymphadenopathy or supracervical lymphadenopathy is noted. ?Cardiovascular:  ?   Rate and Rhythm: Normal rate and regular rhythm.  ?   Heart sounds: Normal heart sounds. No murmur heard. ?  No friction rub. No gallop.  ?Pulmonary:  ?   Effort: Pulmonary effort is normal. No respiratory distress.  ?   Breath sounds: Normal breath sounds. No stridor. No wheezing, rhonchi or rales.  ?Musculoskeletal:  ?   Cervical  back: Normal range of motion and neck supple.  ?Lymphadenopathy:  ?   Cervical: No cervical adenopathy.  ?Skin: ?   General: Skin is warm and dry.  ?Neurological:  ?   Mental Status: She is alert and oriented to person, place, and time.  ?NM THYROID MULT UPTAKE W/IMAGING (Accession [id number]) (Order [id number]) ?Imaging ?Date: 02/16/2021 Department: [facility] NUCLEAR MEDICINE Released By: Verita Schneiders Authorizing: Tawni Millers, MD  ? ?Exam Status ? ?Status  ?Final [99]  ? ?PACS Intelerad Image Link ? ? Show images for NM THYROID MULT UPTAKE W/IMAGING ? ?Study Result ? ?Narrative & Impression  ?CLINICAL DATA:  Suppressed TSH, weight loss, increased nervousness ?  ?EXAM: ?THYROID SCAN AND UPTAKE - 4 AND 24 HOURS ?  ?TECHNIQUE: ?Following oral administration of I-123 capsule, anterior planar ?imaging was acquired at 24 hours. Thyroid uptake was calculated with ?a thyroid  probe at 4-6 hours and 24 hours. ?  ?RADIOPHARMACEUTICALS:  293.2 uCi I-123 sodium iodide p.o. ?  ?COMPARISON:  None ?  ?FINDINGS: ?Thyroid lobes appear enlarged bilaterally. ?  ?Both lobes demonstrate large cold masses involving the mid to ?inferior lobes. ?  ?4 hour I-123 uptake = 5.5% (normal 5-20%) ?  ?24 hour I-123 uptake = 18.4% (normal 10-30%) ?  ?IMPRESSION: ?Normal 4 hour and 24 hour radio iodine uptakes. ?  ?Large cold masses within the mid to inferior thyroid lobes ?bilaterally; thyroid ultrasound assessment recommended to exclude ?neoplasm. ?  ?  ?Electronically Signed ?  By: Lavonia Dana M.D. ?  On: 02/17/2021 10:35 ?  ?  ? ?Result History ? ?NM THYROID MULT UPTAKE W/IMAGING (Order [id number]) on 02/17/2021 - Order Result History Report ? ?MyChart Results Release ? ?MyChart Status: Active  Results Release  ? ? ? ?Encounter-Level Documents on 02/16/2021: ? ?Electronic signature on 02/12/2021 12:20 AM - 1 of 3 e-signatures recorded ?  ?   ? ?Order-Level Documents: ? ?There are no order-level documents. ? ?Hospital  Account-Level Documents: ? ?There are no hospital account-level documents. ? ?Vitals ? ?Height Weight BMI (Calculated)  ?5\' 11"  (1.803 m) 250 lb 3.2 oz (113.5 kg) 34.91  ? ?Assessment/Recommendation ? ?

## 2021-04-21 ENCOUNTER — Other Ambulatory Visit: Payer: Self-pay

## 2021-04-24 ENCOUNTER — Telehealth: Payer: Self-pay | Admitting: Nurse Practitioner

## 2021-04-24 NOTE — Telephone Encounter (Signed)
Called the patient and she stated that she is having her thyroidectomy on 05/05/2021 and she is scheduled to see you on 05/10/2021. She wants to know when she needs to move her follow up with you and still do labs 1 week before her visit? She will not see her PCP until June. ?

## 2021-04-24 NOTE — Telephone Encounter (Signed)
We can push it out 1 more week.  I would like to see her 2 weeks postop with labs prior to visit.

## 2021-04-24 NOTE — Telephone Encounter (Signed)
Called the patient and moved her follow up appointment to 05/18/2022 and she is aware to have her labs done before her visit. Patient verbalized an understanding. ?

## 2021-04-24 NOTE — Telephone Encounter (Signed)
Pt called left voicemail and requesting to speak with megan. [phone] ?

## 2021-04-27 ENCOUNTER — Other Ambulatory Visit (HOSPITAL_COMMUNITY): Payer: Self-pay

## 2021-04-28 ENCOUNTER — Other Ambulatory Visit: Payer: Self-pay

## 2021-05-02 ENCOUNTER — Other Ambulatory Visit: Payer: Self-pay

## 2021-05-02 NOTE — Patient Instructions (Signed)
? ? ? ? ? ? ? ? Lindsey Gibson ? 05/02/2021  ?  ? @PREFPERIOPPHARMACY @ ? ? Your procedure is scheduled on  05/05/2021. ? ? Report to [facility] at  0600  A.M. ? ? Call this number if you have problems the morning of surgery: ? [phone] ? ? Remember: ? Do not eat or drink after midnight. ?  ? ?  Use your inhalers before you come and bring your rescue inhaler with you. ?  ? Take these medicines the morning of surgery with A SIP OF WATER  ? ?                               carvedilol, zyrtec. ?  ? Do not wear jewelry, make-up or nail polish. ? Do not wear lotions, powders, or perfumes, or deodorant. ? Do not shave 48 hours prior to surgery.  Men may shave face and neck. ? Do not bring valuables to the hospital. ? [facility] is not responsible for any belongings or valuables. ? ?Contacts, dentures or bridgework may not be worn into surgery.  Leave your suitcase in the car.  After surgery it may be brought to your room. ? ?For patients admitted to the hospital, discharge time will be determined by your treatment team. ? ?Patients discharged the day of surgery will not be allowed to drive home and must have someone with them for 24 hours.  ? ? ?Special instructions:   DO NOT smoke tobacco or vape for 24 hours before your procedure. ? ?Please read over the following fact sheets that you were given. ?Coughing and Deep Breathing, Blood Transfusion Information, Surgical Site Infection Prevention, Anesthesia Post-op Instructions, and Care and Recovery After Surgery ?  ? ? ? Thyroidectomy, Care After ?This sheet gives you information about how to care for yourself after your procedure. Your health care provider may also give you more specific instructions. If you have problems or questions, contact your health care provider. ?What can I expect after the procedure? ?After the procedure, it is common to have: ?Mild pain in the neck or upper body, especially when swallowing. ?A swollen neck. ?A sore throat. ?A weak or hoarse  voice. ?Slight tingling or numbness around your mouth, or in your fingers or toes. This may last for a day or two after surgery. This condition is caused by low levels of calcium. You may be given calcium supplements to treat it. ?Follow these instructions at home: ? ?Medicines ?Take over-the-counter and prescription medicines only as told by your health care provider. ?Do not drive or use heavy machinery while taking prescription pain medicine. ?Do not take medicines that contain aspirin and ibuprofen until your health care provider says that you can. These medicines can increase your risk of bleeding. ?Take a thyroid hormone medicine as recommended by your health care provider. You will have to take this medicine for the rest of your life if your entire thyroid was removed. ?Eating and drinking ?Start slowly with eating. You may need to have only liquids and soft foods for a few days or as directed by your health care provider. ?To prevent or treat constipation while you are taking prescription pain medicine, your health care provider may recommend that you: ?Drink enough fluid to keep your urine pale yellow. ?Take over-the-counter or prescription medicines. ?Eat foods that are high in fiber, such as fresh fruits and vegetables, whole grains, and beans. ?Limit foods  that are high in fat and processed sugars, such as fried and sweet foods. ?Incision care ?Follow instructions from your health care provider about how to take care of your incision. Make sure you: ?Wash your hands with soap and water before you change your bandage (dressing). If soap and water are not available, use hand sanitizer. ?Change your dressing as told by your health care provider. ?Leave stitches (sutures), skin glue, or adhesive strips in place. These skin closures may need to stay in place for 2 weeks or longer. If adhesive strip edges start to loosen and curl up, you may trim the loose edges. Do not remove adhesive strips completely unless  your health care provider tells you to do that. ?Check your incision area every day for signs of infection. Check for: ?Redness, swelling, or pain. ?Fluid or blood. ?Warmth. ?Pus or a bad smell. ?Do not take baths, swim, or use a hot tub until your health care provider approves. ?Activity ?For the first 10 days after the procedure or as instructed by your health care provider: ?Do not lift anything that is heavier than 10 lb (4.5 kg). ?Do not jog, swim, or do other strenuous exercises. ?Do not play contact sports. ?Avoid sitting for a long time without moving. Get up to take short walks every 1-2 hours. This is needed to improve blood flow and breathing. Ask for help if you feel weak or unsteady. ?Return to your normal activities as told by your health care provider. Ask your health care provider what activities are safe for you. ?General instructions ?Do not use any products that contain nicotine or tobacco, such as cigarettes and e-cigarettes. These can delay healing after surgery. If you need help quitting, ask your health care provider. ?Keep all follow-up visits as told by your health care provider. This is important. Your health care provider needs to monitor the calcium level in your blood to make sure that it does not become low. ?Contact a health care provider if you: ?Have a fever. ?Have more redness, swelling, or pain around your incision area. ?Have fluid or blood coming from your incision area. ?Notice that your incision area feels warm to the touch. ?Have pus or a bad smell coming from your incision area. ?Have trouble talking. ?Have nausea or vomiting for more than 2 days. ?Get help right away if you: ?Have trouble breathing. ?Have trouble swallowing. ?Develop a rash. ?Develop a cough that gets worse. ?Notice that your speech changes, or you have hoarseness that gets worse. ?Develop numbness, tingling, or muscle spasms in the arms, hands, feet, or face. ?Summary ?After the procedure, it is common to  feel mild pain in the neck or upper body, especially when swallowing. ?Take medicines as told by your health care provider. These include pain medicines and thyroid hormones, if required. ?Follow instructions from your health care provider about how to take care of your incision. Watch for signs of infection. ?Keep all follow-up visits as told by your health care provider. This is important. Your health care provider needs to monitor the calcium level in your blood to make sure that it does not become low. ?Get help right away if you develop difficulty breathing, or numbness, tingling, or muscle spasms in the arms, hands, feet, or face. ?This information is not intended to replace advice given to you by your health care provider. Make sure you discuss any questions you have with your health care provider. ?Document Revised: 08/27/2019 Document Reviewed: 08/27/2019 ?Elsevier Patient Education ? 2023  [employer]. ?General Anesthesia, Adult, Care After ?This sheet gives you information about how to care for yourself after your procedure. Your health care provider may also give you more specific instructions. If you have problems or questions, contact your health care provider. ?What can I expect after the procedure? ?After the procedure, the following side effects are common: ?Pain or discomfort at the IV site. ?Nausea. ?Vomiting. ?Sore throat. ?Trouble concentrating. ?Feeling cold or chills. ?Feeling weak or tired. ?Sleepiness and fatigue. ?Soreness and body aches. These side effects can affect parts of the body that were not involved in surgery. ?Follow these instructions at home: ?For the time period you were told by your health care provider: ? ?Rest. ?Do not participate in activities where you could fall or become injured. ?Do not drive or use machinery. ?Do not drink alcohol. ?Do not take sleeping pills or medicines that cause drowsiness. ?Do not make important decisions or sign legal documents. ?Do not take  care of children on your own. ?Eating and drinking ?Follow any instructions from your health care provider about eating or drinking restrictions. ?When you feel hungry, start by eating small amounts of fo

## 2021-05-03 ENCOUNTER — Other Ambulatory Visit (HOSPITAL_COMMUNITY)
Admission: RE | Admit: 2021-05-03 | Discharge: 2021-05-03 | Disposition: A | Discharge status: Home / Self Care | Payer: Self-pay | Source: Ambulatory Visit

## 2021-05-03 ENCOUNTER — Ambulatory Visit (HOSPITAL_COMMUNITY)
Admission: RE | Admit: 2021-05-03 | Discharge: 2021-05-03 | Disposition: A | Discharge status: Home / Self Care | Payer: Self-pay | Source: Ambulatory Visit | Attending: General Surgery | Admitting: General Surgery

## 2021-05-03 ENCOUNTER — Encounter (HOSPITAL_COMMUNITY)
Admission: RE | Admit: 2021-05-03 | Discharge: 2021-05-03 | Disposition: A | Discharge status: Home / Self Care | Payer: Self-pay | Source: Ambulatory Visit | Attending: General Surgery | Admitting: General Surgery

## 2021-05-03 ENCOUNTER — Encounter (HOSPITAL_COMMUNITY): Payer: Self-pay

## 2021-05-03 ENCOUNTER — Ambulatory Visit: Payer: Self-pay | Admitting: Internal Medicine

## 2021-05-03 VITALS — BP 140/58 | HR 76 | Temp 98.5°F | Resp 18 | Ht 70.0 in | Wt 251.0 lb

## 2021-05-03 DIAGNOSIS — E049 Nontoxic goiter, unspecified: Secondary | ICD-10-CM | POA: Insufficient documentation

## 2021-05-03 DIAGNOSIS — Z01818 Encounter for other preprocedural examination: Secondary | ICD-10-CM | POA: Insufficient documentation

## 2021-05-03 HISTORY — DX: Unspecified osteoarthritis, unspecified site: M19.90

## 2021-05-03 HISTORY — DX: Anemia, unspecified: D64.9

## 2021-05-03 HISTORY — DX: Anxiety disorder, unspecified: F41.9

## 2021-05-03 LAB — TYPE AND SCREEN
ABO/RH(D): O POS
Antibody Screen: NEGATIVE

## 2021-05-03 LAB — COMPREHENSIVE METABOLIC PANEL
ALT: 25 U/L (ref 0–44)
AST: 23 U/L (ref 15–41)
Albumin: 4.2 g/dL (ref 3.5–5.0)
Alkaline Phosphatase: 83 U/L (ref 38–126)
Anion gap: 7 (ref 5–15)
BUN: 13 mg/dL (ref 8–23)
CO2: 28 mmol/L (ref 22–32)
Calcium: 9.5 mg/dL (ref 8.9–10.3)
Chloride: 105 mmol/L (ref 98–111)
Creatinine, Ser: 0.8 mg/dL (ref 0.44–1.00)
GFR, Estimated: >60 mL/min (ref >60)
Glucose, Bld: 97 mg/dL (ref 70–99)
Potassium: 3.8 mmol/L (ref 3.5–5.1)
Sodium: 140 mmol/L (ref 135–145)
Total Bilirubin: 1.7 mg/dL — ABNORMAL HIGH (ref 0.3–1.2)
Total Protein: 7.8 g/dL (ref 6.5–8.1)

## 2021-05-03 LAB — CBC WITH DIFFERENTIAL/PLATELET
Abs Immature Granulocytes: 0.01 10*3/uL (ref 0.00–0.07)
Basophils Absolute: 0 10*3/uL (ref 0.0–0.1)
Basophils Relative: 0 %
Eosinophils Absolute: 0.1 10*3/uL (ref 0.0–0.5)
Eosinophils Relative: 3 %
HCT: 38.4 % (ref 36.0–46.0)
Hemoglobin: 12.3 g/dL (ref 12.0–15.0)
Immature Granulocytes: 0 %
Lymphocytes Relative: 36 %
Lymphs Abs: 2 10*3/uL (ref 0.7–4.0)
MCH: 27.2 pg (ref 26.0–34.0)
MCHC: 32 g/dL (ref 30.0–36.0)
MCV: 85 fL (ref 80.0–100.0)
Monocytes Absolute: 0.4 10*3/uL (ref 0.1–1.0)
Monocytes Relative: 8 %
Neutro Abs: 3 10*3/uL (ref 1.7–7.7)
Neutrophils Relative %: 53 %
Platelets: 244 10*3/uL (ref 150–400)
RBC: 4.52 MIL/uL (ref 3.87–5.11)
RDW: 14.6 % (ref 11.5–15.5)
WBC: 5.5 10*3/uL (ref 4.0–10.5)
nRBC: 0 % (ref 0.0–0.2)

## 2021-05-03 LAB — TSH: TSH: 0.377 u[IU]/mL (ref 0.350–4.500)

## 2021-05-04 ENCOUNTER — Other Ambulatory Visit: Payer: Self-pay

## 2021-05-04 ENCOUNTER — Other Ambulatory Visit: Payer: Self-pay | Admitting: Internal Medicine

## 2021-05-04 MED ORDER — CETIRIZINE HCL 10 MG PO TABS
ORAL_TABLET | Freq: Every day | ORAL | 1 refills | Status: DC
  Filled 2021-05-04: qty 90, 90d supply, fill #0
  Filled 2021-06-19: qty 90, 90d supply, fill #1
  Filled 2021-06-21 – 2021-07-27 (×2): qty 90, 90d supply, fill #0

## 2021-05-04 MED ORDER — ALBUTEROL SULFATE HFA 108 (90 BASE) MCG/ACT IN AERS
INHALATION_SPRAY | RESPIRATORY_TRACT | 1 refills | Status: DC
  Filled 2021-05-04: qty 6.7, 50d supply, fill #0
  Filled 2021-06-19: qty 6.7, 50d supply, fill #1
  Filled 2021-06-21: qty 6.7, 50d supply, fill #0
  Filled 2021-08-08: qty 6.7, 50d supply, fill #1
  Filled 2021-09-27: qty 6.7, 50d supply, fill #2
  Filled 2021-11-29: qty 6.7, 50d supply, fill #3

## 2021-05-05 ENCOUNTER — Other Ambulatory Visit: Payer: Self-pay | Admitting: General Surgery

## 2021-05-05 ENCOUNTER — Ambulatory Visit (HOSPITAL_BASED_OUTPATIENT_CLINIC_OR_DEPARTMENT_OTHER): Payer: Self-pay | Admitting: Anesthesiology

## 2021-05-05 ENCOUNTER — Other Ambulatory Visit: Payer: Self-pay

## 2021-05-05 ENCOUNTER — Encounter (HOSPITAL_COMMUNITY): Payer: Self-pay | Admitting: General Surgery

## 2021-05-05 ENCOUNTER — Observation Stay (HOSPITAL_COMMUNITY)
Admission: RE | Admit: 2021-05-05 | Discharge: 2021-05-06 | Disposition: A | Discharge status: Home / Self Care | Payer: Self-pay | Attending: General Surgery | Admitting: General Surgery

## 2021-05-05 ENCOUNTER — Ambulatory Visit (HOSPITAL_COMMUNITY): Payer: Self-pay | Admitting: Anesthesiology

## 2021-05-05 ENCOUNTER — Encounter (HOSPITAL_COMMUNITY)
Admission: RE | Disposition: A | Discharge status: Home / Self Care | Payer: Self-pay | Source: Home / Self Care | Attending: General Surgery

## 2021-05-05 ENCOUNTER — Other Ambulatory Visit: Payer: Self-pay | Admitting: Family Medicine

## 2021-05-05 DIAGNOSIS — Z87891 Personal history of nicotine dependence: Secondary | ICD-10-CM | POA: Insufficient documentation

## 2021-05-05 DIAGNOSIS — E042 Nontoxic multinodular goiter: Principal | ICD-10-CM | POA: Insufficient documentation

## 2021-05-05 DIAGNOSIS — I10 Essential (primary) hypertension: Secondary | ICD-10-CM

## 2021-05-05 DIAGNOSIS — Z9089 Acquired absence of other organs: Secondary | ICD-10-CM

## 2021-05-05 DIAGNOSIS — E890 Postprocedural hypothyroidism: Secondary | ICD-10-CM

## 2021-05-05 DIAGNOSIS — E0789 Other specified disorders of thyroid: Secondary | ICD-10-CM | POA: Insufficient documentation

## 2021-05-05 DIAGNOSIS — Z01818 Encounter for other preprocedural examination: Secondary | ICD-10-CM

## 2021-05-05 DIAGNOSIS — I252 Old myocardial infarction: Secondary | ICD-10-CM | POA: Insufficient documentation

## 2021-05-05 DIAGNOSIS — J449 Chronic obstructive pulmonary disease, unspecified: Secondary | ICD-10-CM | POA: Insufficient documentation

## 2021-05-05 DIAGNOSIS — Z7982 Long term (current) use of aspirin: Secondary | ICD-10-CM | POA: Insufficient documentation

## 2021-05-05 DIAGNOSIS — I2510 Atherosclerotic heart disease of native coronary artery without angina pectoris: Secondary | ICD-10-CM | POA: Insufficient documentation

## 2021-05-05 DIAGNOSIS — J45909 Unspecified asthma, uncomplicated: Secondary | ICD-10-CM | POA: Insufficient documentation

## 2021-05-05 DIAGNOSIS — Z955 Presence of coronary angioplasty implant and graft: Secondary | ICD-10-CM | POA: Insufficient documentation

## 2021-05-05 DIAGNOSIS — Z79899 Other long term (current) drug therapy: Secondary | ICD-10-CM | POA: Insufficient documentation

## 2021-05-05 DIAGNOSIS — E049 Nontoxic goiter, unspecified: Secondary | ICD-10-CM

## 2021-05-05 HISTORY — PX: THYROIDECTOMY: SHX17

## 2021-05-05 LAB — COMPREHENSIVE METABOLIC PANEL
ALT: 23 U/L (ref 0–44)
AST: 27 U/L (ref 15–41)
Albumin: 3.9 g/dL (ref 3.5–5.0)
Alkaline Phosphatase: 78 U/L (ref 38–126)
Anion gap: 10 (ref 5–15)
BUN: 19 mg/dL (ref 8–23)
CO2: 23 mmol/L (ref 22–32)
Calcium: 8.8 mg/dL — ABNORMAL LOW (ref 8.9–10.3)
Chloride: 104 mmol/L (ref 98–111)
Creatinine, Ser: 1.04 mg/dL — ABNORMAL HIGH (ref 0.44–1.00)
GFR, Estimated: >60 mL/min (ref >60)
Glucose, Bld: 155 mg/dL — ABNORMAL HIGH (ref 70–99)
Potassium: 4.5 mmol/L (ref 3.5–5.1)
Sodium: 137 mmol/L (ref 135–145)
Total Bilirubin: 1.7 mg/dL — ABNORMAL HIGH (ref 0.3–1.2)
Total Protein: 7.4 g/dL (ref 6.5–8.1)

## 2021-05-05 LAB — ABO/RH: ABO/RH(D): O POS

## 2021-05-05 SURGERY — THYROIDECTOMY
Anesthesia: General | Site: Neck

## 2021-05-05 MED ORDER — LEVOTHYROXINE SODIUM 100 MCG PO TABS
100.0000 ug | ORAL_TABLET | Freq: Every day | ORAL | Status: DC
  Administered 2021-05-06: 100 ug via ORAL
  Filled 2021-05-05: qty 1

## 2021-05-05 MED ORDER — PHENYLEPHRINE 80 MCG/ML (10ML) SYRINGE FOR IV PUSH (FOR BLOOD PRESSURE SUPPORT)
PREFILLED_SYRINGE | INTRAVENOUS | Status: DC | PRN
  Administered 2021-05-05 (×3): 80 ug via INTRAVENOUS

## 2021-05-05 MED ORDER — SPIRONOLACTONE 25 MG PO TABS
25.0000 mg | ORAL_TABLET | Freq: Every day | ORAL | Status: DC
  Administered 2021-05-05 – 2021-05-06 (×2): 25 mg via ORAL
  Filled 2021-05-05 (×2): qty 1

## 2021-05-05 MED ORDER — HYDROMORPHONE HCL 1 MG/ML IJ SOLN: 1.0000 mg | INTRAMUSCULAR | Status: DC | PRN

## 2021-05-05 MED ORDER — KETOROLAC TROMETHAMINE 30 MG/ML IJ SOLN
INTRAMUSCULAR | Status: AC
Start: 2021-05-05 — End: ?
  Filled 2021-05-05: qty 1

## 2021-05-05 MED ORDER — MIDAZOLAM HCL 2 MG/2ML IJ SOLN
INTRAMUSCULAR | Status: DC | PRN
  Administered 2021-05-05: 2 mg via INTRAVENOUS

## 2021-05-05 MED ORDER — CEFAZOLIN SODIUM-DEXTROSE 2-4 GM/100ML-% IV SOLN
INTRAVENOUS | Status: AC
  Filled 2021-05-05: qty 100

## 2021-05-05 MED ORDER — PROPOFOL 10 MG/ML IV BOLUS
INTRAVENOUS | Status: DC | PRN
  Administered 2021-05-05: 200 mg via INTRAVENOUS

## 2021-05-05 MED ORDER — LOSARTAN POTASSIUM 50 MG PO TABS
50.0000 mg | ORAL_TABLET | Freq: Every day | ORAL | Status: DC
  Administered 2021-05-05 – 2021-05-06 (×2): 50 mg via ORAL
  Filled 2021-05-05 (×2): qty 1

## 2021-05-05 MED ORDER — SODIUM CHLORIDE 0.9 % IV SOLN: INTRAVENOUS | Status: DC

## 2021-05-05 MED ORDER — ROCURONIUM BROMIDE 10 MG/ML (PF) SYRINGE
PREFILLED_SYRINGE | INTRAVENOUS | Status: DC | PRN
  Administered 2021-05-05: 60 mg via INTRAVENOUS
  Administered 2021-05-05: 10 mg via INTRAVENOUS

## 2021-05-05 MED ORDER — LEVOTHYROXINE SODIUM 100 MCG PO TABS
100.0000 ug | ORAL_TABLET | Freq: Every day | ORAL | 1 refills | Status: DC
  Filled 2021-05-05: qty 30, 30d supply, fill #0

## 2021-05-05 MED ORDER — ACETAMINOPHEN 325 MG PO TABS: 650.0000 mg | ORAL_TABLET | Freq: Four times a day (QID) | ORAL | Status: DC | PRN

## 2021-05-05 MED ORDER — KETOROLAC TROMETHAMINE 30 MG/ML IJ SOLN
30.0000 mg | Freq: Four times a day (QID) | INTRAMUSCULAR | Status: AC
  Administered 2021-05-05: 30 mg via INTRAVENOUS

## 2021-05-05 MED ORDER — MIDAZOLAM HCL 2 MG/2ML IJ SOLN
INTRAMUSCULAR | Status: AC
  Filled 2021-05-05: qty 2

## 2021-05-05 MED ORDER — LORATADINE 10 MG PO TABS
10.0000 mg | ORAL_TABLET | Freq: Every day | ORAL | Status: DC
  Filled 2021-05-05: qty 1

## 2021-05-05 MED ORDER — OXYCODONE HCL 5 MG PO TABS
5.0000 mg | ORAL_TABLET | ORAL | Status: DC | PRN
  Administered 2021-05-05 (×2): 10 mg via ORAL
  Filled 2021-05-05 (×2): qty 2

## 2021-05-05 MED ORDER — FENTANYL CITRATE (PF) 250 MCG/5ML IJ SOLN
INTRAMUSCULAR | Status: AC
  Filled 2021-05-05: qty 5

## 2021-05-05 MED ORDER — LACTATED RINGERS IV SOLN: INTRAVENOUS | Status: DC

## 2021-05-05 MED ORDER — VANCOMYCIN HCL 1500 MG/300ML IV SOLN
1500.0000 mg | INTRAVENOUS | Status: AC
  Administered 2021-05-05: 1500 mg via INTRAVENOUS
  Filled 2021-05-05: qty 300

## 2021-05-05 MED ORDER — CARVEDILOL 3.125 MG PO TABS
3.1250 mg | ORAL_TABLET | Freq: Two times a day (BID) | ORAL | Status: DC
  Administered 2021-05-05 – 2021-05-06 (×2): 3.125 mg via ORAL
  Filled 2021-05-05 (×2): qty 1

## 2021-05-05 MED ORDER — ONDANSETRON HCL 4 MG/2ML IJ SOLN
INTRAMUSCULAR | Status: DC | PRN
  Administered 2021-05-05: 4 mg via INTRAVENOUS

## 2021-05-05 MED ORDER — MEPERIDINE HCL 50 MG/ML IJ SOLN: 6.2500 mg | INTRAMUSCULAR | Status: DC | PRN

## 2021-05-05 MED ORDER — CHLORHEXIDINE GLUCONATE CLOTH 2 % EX PADS: 6.0000 | MEDICATED_PAD | Freq: Once | CUTANEOUS | Status: DC

## 2021-05-05 MED ORDER — ONDANSETRON HCL 4 MG/2ML IJ SOLN: 4.0000 mg | Freq: Four times a day (QID) | INTRAMUSCULAR | Status: DC | PRN

## 2021-05-05 MED ORDER — BUPIVACAINE LIPOSOME 1.3 % IJ SUSP
INTRAMUSCULAR | Status: AC
  Filled 2021-05-05: qty 20

## 2021-05-05 MED ORDER — PROPOFOL 10 MG/ML IV BOLUS
INTRAVENOUS | Status: AC
  Filled 2021-05-05: qty 20

## 2021-05-05 MED ORDER — ORAL CARE MOUTH RINSE: 15.0000 mL | Freq: Once | OROMUCOSAL | Status: AC

## 2021-05-05 MED ORDER — SODIUM CHLORIDE 0.9 % IR SOLN
Status: DC | PRN
  Administered 2021-05-05: 1000 mL

## 2021-05-05 MED ORDER — KETOROLAC TROMETHAMINE 30 MG/ML IJ SOLN: 30.0000 mg | Freq: Four times a day (QID) | INTRAMUSCULAR | Status: DC | PRN

## 2021-05-05 MED ORDER — PROPOFOL 10 MG/ML IV BOLUS
INTRAVENOUS | Status: AC
Start: 2021-05-05 — End: ?
  Filled 2021-05-05: qty 20

## 2021-05-05 MED ORDER — SUGAMMADEX SODIUM 500 MG/5ML IV SOLN
INTRAVENOUS | Status: DC | PRN
  Administered 2021-05-05: 250 mg via INTRAVENOUS

## 2021-05-05 MED ORDER — HEMOSTATIC AGENTS (NO CHARGE) OPTIME
TOPICAL | Status: DC | PRN
  Administered 2021-05-05 (×3): 1 via TOPICAL

## 2021-05-05 MED ORDER — BUPIVACAINE LIPOSOME 1.3 % IJ SUSP
INTRAMUSCULAR | Status: DC | PRN
  Administered 2021-05-05: 20 mL

## 2021-05-05 MED ORDER — ALBUTEROL SULFATE (2.5 MG/3ML) 0.083% IN NEBU: 3.0000 mL | INHALATION_SOLUTION | RESPIRATORY_TRACT | Status: DC | PRN

## 2021-05-05 MED ORDER — DEXAMETHASONE SODIUM PHOSPHATE 10 MG/ML IJ SOLN
INTRAMUSCULAR | Status: DC | PRN
  Administered 2021-05-05: 10 mg via INTRAVENOUS

## 2021-05-05 MED ORDER — ADULT MULTIVITAMIN W/MINERALS CH
1.0000 | ORAL_TABLET | Freq: Every day | ORAL | Status: DC
  Administered 2021-05-05 – 2021-05-06 (×2): 1 via ORAL
  Filled 2021-05-05 (×2): qty 1

## 2021-05-05 MED ORDER — SCOPOLAMINE 1 MG/3DAYS TD PT72
1.0000 | MEDICATED_PATCH | Freq: Once | TRANSDERMAL | Status: DC
  Administered 2021-05-05: 1.5 mg via TRANSDERMAL
  Filled 2021-05-05: qty 1

## 2021-05-05 MED ORDER — HYDROMORPHONE HCL 1 MG/ML IJ SOLN
0.2500 mg | INTRAMUSCULAR | Status: DC | PRN
  Administered 2021-05-05 (×2): 0.5 mg via INTRAVENOUS
  Filled 2021-05-05 (×2): qty 0.5

## 2021-05-05 MED ORDER — FUROSEMIDE 20 MG PO TABS
20.0000 mg | ORAL_TABLET | Freq: Every day | ORAL | Status: DC
  Administered 2021-05-05 – 2021-05-06 (×2): 20 mg via ORAL
  Filled 2021-05-05 (×2): qty 1

## 2021-05-05 MED ORDER — FENTANYL CITRATE (PF) 100 MCG/2ML IJ SOLN
INTRAMUSCULAR | Status: DC | PRN
  Administered 2021-05-05 (×3): 50 ug via INTRAVENOUS
  Administered 2021-05-05: 100 ug via INTRAVENOUS

## 2021-05-05 MED ORDER — ATORVASTATIN CALCIUM 40 MG PO TABS
80.0000 mg | ORAL_TABLET | Freq: Every day | ORAL | Status: DC
  Administered 2021-05-05: 80 mg via ORAL
  Filled 2021-05-05: qty 2

## 2021-05-05 MED ORDER — LIDOCAINE HCL (CARDIAC) PF 100 MG/5ML IV SOSY
PREFILLED_SYRINGE | INTRAVENOUS | Status: DC | PRN
  Administered 2021-05-05: 40 mg via INTRATRACHEAL
  Administered 2021-05-05: 60 mg via INTRATRACHEAL

## 2021-05-05 MED ORDER — ONDANSETRON 4 MG PO TBDP: 4.0000 mg | ORAL_TABLET | Freq: Four times a day (QID) | ORAL | Status: DC | PRN

## 2021-05-05 MED ORDER — VANCOMYCIN HCL IN DEXTROSE 1-5 GM/200ML-% IV SOLN
INTRAVENOUS | Status: DC
Start: 2021-05-05 — End: 2021-05-05
  Filled 2021-05-05: qty 200

## 2021-05-05 MED ORDER — SIMETHICONE 80 MG PO CHEW: 40.0000 mg | CHEWABLE_TABLET | Freq: Four times a day (QID) | ORAL | Status: DC | PRN

## 2021-05-05 MED ORDER — ONDANSETRON HCL 4 MG/2ML IJ SOLN: 4.0000 mg | Freq: Once | INTRAMUSCULAR | Status: DC | PRN

## 2021-05-05 MED ORDER — ACETAMINOPHEN 650 MG RE SUPP: 650.0000 mg | Freq: Four times a day (QID) | RECTAL | Status: DC | PRN

## 2021-05-05 MED ORDER — CHLORHEXIDINE GLUCONATE 0.12 % MT SOLN
15.0000 mL | Freq: Once | OROMUCOSAL | Status: AC
  Administered 2021-05-05: 15 mL via OROMUCOSAL

## 2021-05-05 MED ORDER — OYSTER SHELL CALCIUM/D3 500-5 MG-MCG PO TABS
2.0000 | ORAL_TABLET | Freq: Two times a day (BID) | ORAL | Status: DC
  Administered 2021-05-05 – 2021-05-06 (×3): 2 via ORAL
  Filled 2021-05-05 (×3): qty 2

## 2021-05-05 MED ORDER — MENTHOL 3 MG MT LOZG: 1.0000 | LOZENGE | OROMUCOSAL | Status: DC | PRN

## 2021-05-05 SURGICAL SUPPLY — 57 items
APPLICATOR CHLORAPREP 10.5 ORG (MISCELLANEOUS) ×2 IMPLANT
ATTRACTOMAT 16X20 MAGNETIC DRP (DRAPES) ×2 IMPLANT
BLADE SURG 15 STRL LF DISP TIS (BLADE) ×1 IMPLANT
BLADE SURG 15 STRL SS (BLADE) ×1
CLOTH BEACON ORANGE TIMEOUT ST (SAFETY) ×2 IMPLANT
COVER LIGHT HANDLE STERIS (MISCELLANEOUS) ×4 IMPLANT
DERMABOND ADVANCED (GAUZE/BANDAGES/DRESSINGS) ×1
DERMABOND ADVANCED .7 DNX12 (GAUZE/BANDAGES/DRESSINGS) ×1 IMPLANT
DISSECTOR SURG LIGASURE 21 (MISCELLANEOUS) ×1 IMPLANT
DRAPE HALF SHEET 40X57 (DRAPES) ×4 IMPLANT
DRAPE LAPAROTOMY 77X122 PED (DRAPES) ×2 IMPLANT
ELECT NDL TIP 2.8 STRL (NEEDLE) ×1 IMPLANT
ELECT NEEDLE TIP 2.8 STRL (NEEDLE) ×2 IMPLANT
ELECT REM PT RETURN 9FT ADLT (ELECTROSURGICAL) ×2
ELECTRODE REM PT RTRN 9FT ADLT (ELECTROSURGICAL) ×1 IMPLANT
GAUZE 4X4 16PLY ~~LOC~~+RFID DBL (SPONGE) ×5 IMPLANT
GLOVE BIO SURGEON STRL SZ 6.5 (GLOVE) ×1 IMPLANT
GLOVE BIOGEL PI IND STRL 7.0 (GLOVE) ×2 IMPLANT
GLOVE BIOGEL PI IND STRL 7.5 (GLOVE) IMPLANT
GLOVE BIOGEL PI INDICATOR 7.0 (GLOVE) ×4
GLOVE BIOGEL PI INDICATOR 7.5 (GLOVE) ×1
GLOVE ECLIPSE 6.5 STRL STRAW (GLOVE) ×1 IMPLANT
GLOVE SS BIOGEL STRL SZ 6.5 (GLOVE) IMPLANT
GLOVE SUPERSENSE BIOGEL SZ 6.5 (GLOVE) ×1
GLOVE SURG SS PI 7.0 STRL IVOR (GLOVE) ×1 IMPLANT
GLOVE SURG SS PI 7.5 STRL IVOR (GLOVE) ×2 IMPLANT
GOWN STRL REUS W/TWL LRG LVL3 (GOWN DISPOSABLE) ×8 IMPLANT
HEMOSTAT SURGICEL 4X8 (HEMOSTASIS) ×2 IMPLANT
KIT BLADEGUARD II DBL (SET/KITS/TRAYS/PACK) ×2 IMPLANT
KIT TURNOVER KIT A (KITS) ×2 IMPLANT
MANIFOLD NEPTUNE II (INSTRUMENTS) ×2 IMPLANT
MARKER SKIN DUAL TIP RULER LAB (MISCELLANEOUS) ×2 IMPLANT
NDL HYPO 21X1.5 SAFETY (NEEDLE) ×1 IMPLANT
NEEDLE HYPO 21X1.5 SAFETY (NEEDLE) ×2 IMPLANT
NS IRRIG 1000ML POUR BTL (IV SOLUTION) ×2 IMPLANT
PACK BASIC III (CUSTOM PROCEDURE TRAY) ×1
PACK SRG BSC III STRL LF ECLPS (CUSTOM PROCEDURE TRAY) ×1 IMPLANT
PAD ARMBOARD 7.5X6 YLW CONV (MISCELLANEOUS) ×2 IMPLANT
PENCIL SMOKE EVACUATOR COATED (MISCELLANEOUS) ×2 IMPLANT
POWDER SURGICEL 3.0 GRAM (HEMOSTASIS) ×1 IMPLANT
SET BASIN LINEN APH (SET/KITS/TRAYS/PACK) ×2 IMPLANT
SHEARS HARMONIC 9CM CVD (BLADE) ×1 IMPLANT
SPONGE DRAIN TRACH 4X4 STRL 2S (GAUZE/BANDAGES/DRESSINGS) ×2 IMPLANT
SPONGE INTESTINAL PEANUT (DISPOSABLE) ×12 IMPLANT
STAPLER VISISTAT 35W (STAPLE) ×2 IMPLANT
SUT ETHILON 3 0 FSL (SUTURE) ×1 IMPLANT
SUT MNCRL AB 4-0 PS2 18 (SUTURE) ×2 IMPLANT
SUT SILK 2 0 SH (SUTURE) ×1 IMPLANT
SUT SILK 3 0 (SUTURE) ×1
SUT SILK 3-0 18XBRD TIE 12 (SUTURE) IMPLANT
SUT VIC AB 2-0 CT2 27 (SUTURE) ×2 IMPLANT
SUT VIC AB 3-0 SH 27 (SUTURE) ×1
SUT VIC AB 3-0 SH 27X BRD (SUTURE) ×1 IMPLANT
SYR 20ML LL LF (SYRINGE) ×4 IMPLANT
SYSTEM CHEST DRAIN TLS 7FR (DRAIN) ×1 IMPLANT
TOWEL OR 17X26 4PK STRL BLUE (TOWEL DISPOSABLE) ×2 IMPLANT
YANKAUER SUCT BULB TIP 10FT TU (MISCELLANEOUS) ×2 IMPLANT

## 2021-05-05 NOTE — Anesthesia Procedure Notes (Addendum)
Procedure Name: Intubation ?Date/Time: 05/05/2021 7:35 AM ?Performed by: Karna Dupes, CRNA ?Pre-anesthesia Checklist: Patient identified, Emergency Drugs available, Suction available and Patient being monitored ?Patient Re-evaluated:Patient Re-evaluated prior to induction ?Oxygen Delivery Method: Circle system utilized ?Preoxygenation: Pre-oxygenation with 100% oxygen ?Induction Type: IV induction ?Ventilation: Mask ventilation without difficulty ?Laryngoscope Size: Mac and 3 ?Grade View: Grade II ?Tube type: Oral ?Tube size: 7.0 mm ?Number of attempts: 1 ?Airway Equipment and Method: Stylet ?Placement Confirmation: ETT inserted through vocal cords under direct vision, positive ETCO2 and breath sounds checked- equal and bilateral ?Secured at: 21 cm ?Tube secured with: Tape ?Dental Injury: Teeth and Oropharynx as per pre-operative assessment  ? ? ? ? ?

## 2021-05-05 NOTE — Progress Notes (Signed)
?  Transition of Care (TOC) Screening Note ? ? ?Patient Details  ?Name: Lindsey Gibson ?Date of Birth: [birth year] ? ? ?Transition of Care (TOC) CM/SW Contact:    ?Villa Herb, LCSWA ?Phone Number: ?05/05/2021, 4:27 PM ? ? ? ?Transition of Care Department [facility]) has reviewed patient and no TOC needs have been identified at this time. We will continue to monitor patient advancement through interdisciplinary progression rounds. If new patient transition needs arise, please place a TOC consult. ?  ?

## 2021-05-05 NOTE — Plan of Care (Signed)

## 2021-05-05 NOTE — Transfer of Care (Signed)
Immediate Anesthesia Transfer of Care Note ? ?Patient: Lindsey Gibson ? ?Procedure(s) Performed: THYROIDECTOMY, TOTAL (Neck) ? ?Patient Location: PACU ? ?Anesthesia Type:General ? ?Level of Consciousness: awake ? ?Airway & Oxygen Therapy: Patient Spontanous Breathing and Patient connected to nasal cannula oxygen ? ?Post-op Assessment: Report given to RN and Post -op Vital signs reviewed and stable ? ?Post vital signs: Reviewed and stable ? ?Last Vitals:  ?Vitals Value Taken Time  ?BP 130/70 05/05/21 1000  ?Temp    ?Pulse 89 05/05/21 1001  ?Resp 16 05/05/21 1001  ?SpO2 96 % 05/05/21 1001  ?Vitals shown include unvalidated device data. ? ?Last Pain:  ?Vitals:  ? 05/05/21 0631  ?TempSrc: Oral  ?PainSc: 0-No pain  ?   ? ?  ? ?Complications: No notable events documented. ?

## 2021-05-05 NOTE — Interval H&P Note (Signed)
History and Physical Interval Note: ? ?05/05/2021 ?7:14 AM ? ?Lindsey Gibson  has presented today for surgery, with the diagnosis of MULTINODULAR GOITER.  The various methods of treatment have been discussed with the patient and family. After consideration of risks, benefits and other options for treatment, the patient has consented to  Procedure(s): ?THYROIDECTOMY, TOTAL (N/A) as a surgical intervention.  The patient's history has been reviewed, patient examined, no change in status, stable for surgery.  I have reviewed the patient's chart and labs.  Questions were answered to the patient's satisfaction.   ? ? ?Aviva Signs ? ? ?

## 2021-05-05 NOTE — Anesthesia Postprocedure Evaluation (Signed)
Anesthesia Post Note ? ?Patient: Lindsey Gibson ? ?Procedure(s) Performed: THYROIDECTOMY, TOTAL (Neck) ? ?Patient location during evaluation: PACU ?Anesthesia Type: General ?Level of consciousness: awake and alert and oriented ?Pain management: pain level controlled ?Vital Signs Assessment: post-procedure vital signs reviewed and stable ?Respiratory status: spontaneous breathing and respiratory function stable ?Cardiovascular status: blood pressure returned to baseline and stable ?Postop Assessment: no apparent nausea or vomiting ?Anesthetic complications: no ? ? ?No notable events documented. ? ? ?Last Vitals:  ?Vitals:  ? 05/05/21 1115 05/05/21 1151  ?BP: 114/64 (!) 108/55  ?Pulse: 81 79  ?Resp: 14 18  ?Temp:  36.7 ?C  ?SpO2: 94% 92%  ?  ?Last Pain:  ?Vitals:  ? 05/05/21 1151  ?TempSrc: Axillary  ?PainSc:   ? ? ?  ?  ?  ?  ?  ?  ? ?Marnee Sherrard C Marissia Blackham ? ? ? ? ?

## 2021-05-05 NOTE — Op Note (Signed)
Patient:  Lindsey Gibson ? ?DOB:  [birth year] ? ?MRN:  [id number] ? ? ?Preop Diagnosis: Multinodular goiter ? ?Postop Diagnosis: Same ? ?Procedure: Total thyroidectomy ? ?Surgeon: Franky Macho, MD ? ?Assistant: Algis Greenhouse, MD ? ?Anes: General endotracheal ? ?Indications: Patient is a 61 year old white female who was referred to my care by Dr. Fransico Him of endocrinology for a total thyroidectomy.  She has multiple thyroid nodules and enlarged lobes.  She is also having some dysphagia and voice changes.  The risks and benefits of the procedure including bleeding, infection, nerve injury, voice changes, and the possibility of malignancy were fully explained to the patient, who gave informed consent. ? ?Procedure note: The patient was placed in the supine position.  After induction of general endotracheal anesthesia, the neck was extended.  The neck was prepped and draped using the usual sterile technique with ChloraPrep.  Surgical site confirmation was performed. ? ?A transverse incision was made 1 fingerbreadth above the jugular notch.  The platysma was divided using Bovie electrocautery.  The strap muscles were then divided along the midline in a longitudinal fashion.  We first turned our attention to the left lobe.  It was very lobulated with some fibrous encasement possibly due to a history of thyroiditis.  Using the harmonic scalpel, the inferior thyroid artery and vein, middle thyroidal artery and vein, the superior thyroidal artery and vein were all identified, cauterized, and divided in order to roll the left lobe out through the incision.  She did have extension of the inferior lobe just below the jugular notch.  We were able to bring this up.  We did identify the left inferior parathyroid gland.  We also identified the left superior parathyroid gland.  Once the thyroid gland was brought out through the incision, we were able to identify the left recurrent laryngeal nerve.  The left lobe was dissected free from  the trachea using the harmonic scalpel and sent to pathology for further examination.  Dr. Henreitta Leber then performed the procedure on the right lobe.  Again, the harmonic scalpel was used to ligate and divide the superior thyroidal artery and vein, inferior thyroidal artery and vein, and the middle thyroidal artery and vein.  This lobe was also fully involved with multiple nodules and was enlarged.  She was able to bring the right lobe up towards the trachea and excise it from the trachea.  Interestingly, 1-2 lymph nodes were identified and these were sent along with the specimen to pathology.  Care was taken to avoid the recurrent laryngeal nerve.  Both thyroid lobe beds were irrigated with normal saline.  A Valsalva maneuver was performed and no bleeding or air bubbles were noted.  Any bleeding was controlled using the harmonic scalpel.  Surgicel powder and Surgicel were placed in both beds when adequate hemostasis was obtained.  A THC drain was then placed in both beds and brought out through a separate stab wound to the right of the incision.  It was secured in the place using a 4-0 nylon suture.  The strap muscle was reapproximated longitudinally using a 2-0 Vicryl running suture.  The subcutaneous layer with the platysma was reapproximated using a 3-0 running Vicryl suture.  Exparel was instilled into the surrounding wound.  The skin was closed using a 4-0 Monocryl subcuticular suture.  Dermabond was applied. ? ?All tape and needle counts were correct at the end of the procedure.  The patient was extubated in the operating room and transferred to PACU in stable  condition.  The patient was able to phonate the E without difficulty. ? ?Complications: None ? ?EBL: 150 cc ? ?Specimen: Left lobe of thyroid gland, right lobe of thyroid gland with possible lymph nodes ? ?Drains: Drain to thyroid bed ? ? ?  ?

## 2021-05-05 NOTE — Anesthesia Preprocedure Evaluation (Addendum)
Anesthesia Evaluation  ?Patient identified by MRN, date of birth, ID band ?Patient awake ? ? ? ?Reviewed: ?Allergy & Precautions, NPO status , Patient's Chart, lab work & pertinent test results, reviewed documented beta blocker date and time  ? ?Airway ?Mallampati: II ? ?TM Distance: >3 FB ?Neck ROM: Full ? ? ? Dental ? ?(+) Edentulous Upper, Edentulous Lower ?  ?Pulmonary ?asthma , pneumonia, COPD,  COPD inhaler, former smoker,  ?  ?Pulmonary exam normal ?breath sounds clear to auscultation ? ? ? ? ? ? Cardiovascular ?Exercise Tolerance: Good ?hypertension, Pt. on medications and Pt. on home beta blockers ?+ CAD, + Past MI and + Cardiac Stents  ?Normal cardiovascular exam ?Rhythm:Regular Rate:Normal ? ?Great news - heart squeezing function is much improved and now relatively normal @ 50-55%!  Her heart is moderately stiff, thus we still need to focus on good blood pressure control, sodium avoidance, and daily weights. ? ?EKG- NSR ?  ?Neuro/Psych ?PSYCHIATRIC DISORDERS Anxiety negative neurological ROS ?   ? GI/Hepatic ?negative GI ROS, Neg liver ROS,   ?Endo/Other  ?negative endocrine ROS ? Renal/GU ?negative Renal ROS  ?negative genitourinary ?  ?Musculoskeletal ? ?(+) Arthritis , Osteoarthritis,   ? Abdominal ?  ?Peds ?negative pediatric ROS ?(+)  Hematology ? ?(+) Blood dyscrasia, anemia ,   ?Anesthesia Other Findings ? ? Reproductive/Obstetrics ?negative OB ROS ? ?  ? ? ? ? ? ? ? ? ? ? ? ? ? ?  ?  ? ? ? ? ? ? ? ?Anesthesia Physical ?Anesthesia Plan ? ?ASA: 3 ? ?Anesthesia Plan: General  ? ?Post-op Pain Management: Dilaudid IV  ? ?Induction: Intravenous ? ?PONV Risk Score and Plan: 4 or greater and Ondansetron, Dexamethasone, Midazolam and Scopolamine patch - Pre-op ? ?Airway Management Planned: Oral ETT ? ?Additional Equipment:  ? ?Intra-op Plan:  ? ?Post-operative Plan: Extubation in OR ? ?Informed Consent: I have reviewed the patients History and Physical, chart, labs and  discussed the procedure including the risks, benefits and alternatives for the proposed anesthesia with the patient or authorized representative who has indicated his/her understanding and acceptance.  ? ? ? ?Dental advisory given ? ?Plan Discussed with: Surgeon and CRNA ? ?Anesthesia Plan Comments:   ? ? ? ? ? ? ?Anesthesia Quick Evaluation ? ?

## 2021-05-06 LAB — CBC
HCT: 31.1 % — ABNORMAL LOW (ref 36.0–46.0)
Hemoglobin: 9.9 g/dL — ABNORMAL LOW (ref 12.0–15.0)
MCH: 27.3 pg (ref 26.0–34.0)
MCHC: 31.8 g/dL (ref 30.0–36.0)
MCV: 85.7 fL (ref 80.0–100.0)
Platelets: 199 10*3/uL (ref 150–400)
RBC: 3.63 MIL/uL — ABNORMAL LOW (ref 3.87–5.11)
RDW: 14.8 % (ref 11.5–15.5)
WBC: 9.8 10*3/uL (ref 4.0–10.5)
nRBC: 0 % (ref 0.0–0.2)

## 2021-05-06 LAB — COMPREHENSIVE METABOLIC PANEL
ALT: 19 U/L (ref 0–44)
AST: 20 U/L (ref 15–41)
Albumin: 3.2 g/dL — ABNORMAL LOW (ref 3.5–5.0)
Alkaline Phosphatase: 65 U/L (ref 38–126)
Anion gap: 6 (ref 5–15)
BUN: 19 mg/dL (ref 8–23)
CO2: 24 mmol/L (ref 22–32)
Calcium: 8.7 mg/dL — ABNORMAL LOW (ref 8.9–10.3)
Chloride: 107 mmol/L (ref 98–111)
Creatinine, Ser: 0.93 mg/dL (ref 0.44–1.00)
GFR, Estimated: >60 mL/min (ref >60)
Glucose, Bld: 128 mg/dL — ABNORMAL HIGH (ref 70–99)
Potassium: 4.5 mmol/L (ref 3.5–5.1)
Sodium: 137 mmol/L (ref 135–145)
Total Bilirubin: 1.4 mg/dL — ABNORMAL HIGH (ref 0.3–1.2)
Total Protein: 5.8 g/dL — ABNORMAL LOW (ref 6.5–8.1)

## 2021-05-06 LAB — PHOSPHORUS: Phosphorus: 3.5 mg/dL (ref 2.5–4.6)

## 2021-05-06 LAB — MAGNESIUM: Magnesium: 2 mg/dL (ref 1.7–2.4)

## 2021-05-06 MED ORDER — OYSTER SHELL CALCIUM/D3 500-5 MG-MCG PO TABS: 2.0000 | ORAL_TABLET | Freq: Two times a day (BID) | ORAL | 1 refills | Status: DC

## 2021-05-06 MED ORDER — HYDROCODONE-ACETAMINOPHEN 5-325 MG PO TABS: 1.0000 | ORAL_TABLET | Freq: Four times a day (QID) | ORAL | 0 refills | Status: DC | PRN

## 2021-05-06 NOTE — Progress Notes (Signed)
Patient discharged home today, transported home by family. Discharge paperwork went over with patient by Carollee Herter RN, patient verbalized understanding. Belongings sent home with patient. ?

## 2021-05-06 NOTE — Discharge Summary (Signed)
Physician Discharge Summary  ?Patient ID: ?Lindsey Gibson ?MRN: [id number] ?DOB/AGE: 09-Jan-1960 61 y.o. ? ?Admit date: 05/05/2021 ?Discharge date: 05/06/2021 ? ?Admission Diagnoses: Multinodular goiter  ? ?Discharge Diagnoses: Same ?Principal Problem: ?  S/P total thyroidectomy ?Hypertension, morbid obesity ? ?Discharged Condition: good ? ?Hospital Course: Patient is a 61 year old white female with multinodular goiter who underwent a total thyroidectomy on 05/05/2021.  She tolerated the surgery well.  Her postoperative course was unremarkable.  Her diet was advanced out difficulty.  Patient did not have any significant hypocalcemia.  She was started on Synthroid.  She is being discharged home on 05/06/2021 in good and improving condition.  Final pathology is pending. ? ?Treatments: surgery: Total thyroidectomy on 05/05/2021 ? ?Discharge Exam: ?Blood pressure 129/63, pulse 74, temperature 98.2 ?F (36.8 ?C), temperature source Oral, resp. rate 19, SpO2 98 %. ?General appearance: alert, cooperative, and no distress ?Neck: Incision healing well, drain removed.  No swelling present. ?Resp: clear to auscultation bilaterally ?Cardio: regular rate and rhythm, S1, S2 normal, no murmur, click, rub or gallop ? ?Disposition: Discharge disposition: 01-Home or Self Care ? ? ? ? ? ? ?Discharge Instructions   ? ? Diet - low sodium heart healthy   Complete by: As directed ?  ? Increase activity slowly   Complete by: As directed ?  ? ?  ? ?Allergies as of 05/06/2021   ? ?   Reactions  ? Penicillins Anaphylaxis  ? Has patient had a PCN reaction causing immediate rash, facial/tongue/throat swelling, SOB ?If all of the above answers are "NO", then may proceed with Cephalosporin use. ? or lightheadedness with hypotension:unsure ?Has patient had a PCN reaction causing severe rash involving mucus membranes or skin necrosis:unsure ?Has patient had a PCN reaction that required hospitalization:unsure ?Has patient had a PCN reaction occurring within the  last 10 years:unsure ?Childhood reaction  ? ?  ? ?  ?Medication List  ?  ? ?TAKE these medications   ? ?albuterol 108 (90 Base) MCG/ACT inhaler ?Commonly known as: Proventil HFA ?INHALE 1 PUFF INTO THE LUNGS ONCE EVERY 6 HOURS AS NEEDED FOR WHEEZING OR SHORTNESS OF BREATH. ?  ?atorvastatin 80 MG tablet ?Commonly known as: LIPITOR ?TAKE ONE TABLET BY MOUTH EVERY DAY ?What changed: how much to take ?  ?calcium-vitamin D 500-5 MG-MCG tablet ?Commonly known as: OSCAL WITH D ?Take 2 tablets by mouth 2 (two) times daily. ?  ?carvedilol 3.125 MG tablet ?Commonly known as: COREG ?Take 1 tablet by mouth 2 (two) times daily with a meal. ?What changed: how much to take ?  ?cetirizine 10 MG tablet ?Commonly known as: ZYRTEC ?TAKE ONE TABLET BY MOUTH ONCE EVERY DAY. ?  ?furosemide 20 MG tablet ?Commonly known as: LASIX ?Take 1 tablet (20 mg total) by mouth once daily. ?  ?HYDROcodone-acetaminophen 5-325 MG tablet ?Commonly known as: Norco ?Take 1 tablet by mouth every 6 (six) hours as needed for moderate pain. ?  ?ibuprofen 200 MG tablet ?Commonly known as: ADVIL ?Take 400 mg by mouth every 6 (six) hours as needed for mild pain. ?  ?levothyroxine 100 MCG tablet ?Commonly known as: Synthroid ?Take 1 tablet (100 mcg total) by mouth once daily before breakfast. ?  ?losartan 50 MG tablet ?Commonly known as: COZAAR ?TAKE ONE TABLET BY MOUTH ONCE EVERY DAY ?What changed: how much to take ?  ?multivitamin with minerals Tabs tablet ?Take 1 tablet by mouth daily. ?  ?spironolactone 25 MG tablet ?Commonly known as: ALDACTONE ?TAKE ONE TABLET BY MOUTH EVERY  DAY ?What changed: how much to take ?  ? ?  ? ? Follow-up Information   ? ? Franky Macho, MD. Schedule an appointment as soon as possible for a visit on 05/16/2021.   ?Specialty: General Surgery ?Contact information: ?[address] ?[place] [address] ?[phone] ? ? ?  ?  ? ?  ?  ? ?  ? ? ?Signed: ?Franky Macho ?05/06/2021, 10:02 AM ? ? ?

## 2021-05-08 ENCOUNTER — Encounter (HOSPITAL_COMMUNITY): Payer: Self-pay | Admitting: General Surgery

## 2021-05-08 LAB — SURGICAL PATHOLOGY

## 2021-05-10 ENCOUNTER — Other Ambulatory Visit: Payer: Self-pay | Admitting: Emergency Medicine

## 2021-05-10 ENCOUNTER — Other Ambulatory Visit: Payer: Self-pay

## 2021-05-10 ENCOUNTER — Ambulatory Visit: Payer: Self-pay | Admitting: Nurse Practitioner

## 2021-05-10 DIAGNOSIS — E890 Postprocedural hypothyroidism: Secondary | ICD-10-CM

## 2021-05-10 DIAGNOSIS — Z9089 Acquired absence of other organs: Secondary | ICD-10-CM

## 2021-05-10 DIAGNOSIS — E049 Nontoxic goiter, unspecified: Secondary | ICD-10-CM

## 2021-05-11 LAB — T3, FREE: T3, Free: 3.1 pg/mL (ref 2.0–4.4)

## 2021-05-11 LAB — T4, FREE: Free T4: 1.78 ng/dL — ABNORMAL HIGH (ref 0.82–1.77)

## 2021-05-11 LAB — TSH: TSH: 0.13 u[IU]/mL — ABNORMAL LOW (ref 0.450–4.500)

## 2021-05-16 ENCOUNTER — Encounter: Payer: Self-pay | Admitting: General Surgery

## 2021-05-16 ENCOUNTER — Ambulatory Visit (INDEPENDENT_AMBULATORY_CARE_PROVIDER_SITE_OTHER): Payer: Self-pay | Admitting: General Surgery

## 2021-05-16 VITALS — BP 137/63 | HR 63 | Temp 97.8°F | Resp 16 | Ht 71.0 in | Wt 247.0 lb

## 2021-05-16 DIAGNOSIS — Z09 Encounter for follow-up examination after completed treatment for conditions other than malignant neoplasm: Secondary | ICD-10-CM

## 2021-05-16 NOTE — Progress Notes (Signed)
Subjective:  ?  ? Wilburn Cornelia  ?Patient is here for postoperative visit, status post total thyroidectomy.  Patient states her voice is returned to normal, but occasionally she does have a tired voice later in the day.  She has no swallowing difficulties.  She denies any twitching.  She thinks she has an allergy to the Synthroid as she breaks out in a rash around her neck and upper chest. ?Objective:  ? ? BP 137/63   Pulse 63   Temp 97.8 ?F (36.6 ?C) (Oral)   Resp 16   Ht 5\' 11"  (1.803 m)   Wt 247 lb (112 kg)   SpO2 97%   BMI 34.45 kg/m?  ? ?General:  alert, cooperative, and no distress  ?Neck incision healing well with minimal swelling present.  No hematoma present. ?Final pathology was negative for malignancy.  Patient is aware. ?   ? ?Assessment:  ? ? Doing well postoperatively.  ?  ?Plan:  ? ?I told the patient that her tired voice will resolve with time.  She is seeing Dr. Dorris Fetch of endocrinology tomorrow.  He will address her concerns about the Synthroid.  Follow-up here as needed. ?

## 2021-05-17 ENCOUNTER — Encounter: Payer: Self-pay | Admitting: Nurse Practitioner

## 2021-05-17 ENCOUNTER — Ambulatory Visit (INDEPENDENT_AMBULATORY_CARE_PROVIDER_SITE_OTHER): Payer: Self-pay | Admitting: Nurse Practitioner

## 2021-05-17 ENCOUNTER — Other Ambulatory Visit: Payer: Self-pay

## 2021-05-17 ENCOUNTER — Other Ambulatory Visit: Payer: Self-pay | Admitting: Internal Medicine

## 2021-05-17 VITALS — BP 127/77 | HR 66 | Ht 71.0 in | Wt 247.0 lb

## 2021-05-17 DIAGNOSIS — Z9889 Other specified postprocedural states: Secondary | ICD-10-CM

## 2021-05-17 DIAGNOSIS — E042 Nontoxic multinodular goiter: Secondary | ICD-10-CM

## 2021-05-17 DIAGNOSIS — E890 Postprocedural hypothyroidism: Secondary | ICD-10-CM

## 2021-05-17 MED ORDER — SYNTHROID 88 MCG PO TABS
88.0000 ug | ORAL_TABLET | Freq: Every day | ORAL | 1 refills | Status: DC
  Filled 2021-05-17: qty 90, 90d supply, fill #0

## 2021-05-17 NOTE — Patient Instructions (Signed)

## 2021-05-17 NOTE — Progress Notes (Signed)
?Endocrinology Follow Up Note ?05/17/21  ? ? ?---------------------------------------------------------------------------------------------------------------------- ?Subjective   ? ?Past Medical History:  ?Diagnosis Date  ? Anemia   ? Anxiety   ? Arthritis   ? Asthma   ? CAD (coronary artery disease)   ? a. 12/2016 NSTEMI/PCI: LM nl, LAD 30ost, 49m (IVUS->severe plaque burden->3.0 x 18 [place] DES), D1/2/3 nl, LCX 30p, OM1/2/3 nl, RCA nl, RPDA/RPAV/RPL1/2 nl.  ? COPD (chronic obstructive pulmonary disease) ([place])   ? Hyperlipidemia   ? Hypertension   ? Ischemic cardiomyopathy   ? a. 12/2016 Echo: EF 20-25%, mid-apicalanteroseptal, ant, and apical AK, GR1 DD, triv AI, mildly dil LA; b. 04/2017 Echo: EF 50-55%, Gr2 DD, Nl RV fxn.  ? Morbid obesity ([place])   ? Myocardial infarction [facility]) 2018  ?  ?Past Surgical History:  ?Procedure Laterality Date  ? CORONARY STENT INTERVENTION N/A 12/27/2016  ? Procedure: CORONARY STENT INTERVENTION;  Surgeon: Wellington Hampshire, MD;  Location: [place] [facility];  Service: Cardiovascular;  Laterality: N/A;  ? INTRAVASCULAR ULTRASOUND/IVUS N/A 12/27/2016  ? Procedure: Intravascular Ultrasound/IVUS;  Surgeon: Wellington Hampshire, MD;  Location: [place] [facility];  Service: Cardiovascular;  Laterality: N/A;  ? KNEE ARTHROSCOPY Right   ? LEFT HEART CATH AND CORONARY ANGIOGRAPHY N/A 12/27/2016  ? Procedure: LEFT HEART CATH AND CORONARY ANGIOGRAPHY;  Surgeon: Wellington Hampshire, MD;  Location: [place] [facility];  Service: Cardiovascular;  Laterality: N/A;  ? SHOULDER ARTHROSCOPY WITH SUBACROMIAL DECOMPRESSION Right 11/22/2015  ? Procedure: SHOULDER ARTHROSCOPY WITH DEBRIDEMENT, DECOMPRESSION  AND BICEPS TENODESIS;  Surgeon: Corky Mull, MD;  Location: [facility];  Service: Orthopedics;  Laterality: Right;  ? THYROIDECTOMY N/A 05/05/2021  ? Procedure: THYROIDECTOMY, TOTAL;  Surgeon: Aviva Signs, MD;  Location: [facility];  Service: General;  Laterality: N/A;  ? TONSILLECTOMY    ? ULNAR NERVE  TRANSPOSITION Right 11/22/2015  ? Procedure: ULNAR NERVE DECOMPRESSION/TRANSPOSITION;  Surgeon: Corky Mull, MD;  Location: [facility];  Service: Orthopedics;  Laterality: Right;  ?  ?Social History  ? ?Socioeconomic History  ? Marital status: Married  ?  Spouse name: Not on file  ? Number of children: Not on file  ? Years of education: Not on file  ? Highest education level: Not on file  ?Occupational History  ? Occupation: none  ?Tobacco Use  ? Smoking status: Former  ?  Packs/day: 1.00  ?  Years: 18.00  ?  Pack years: 18.00  ?  Types: Cigarettes  ? Smokeless tobacco: Never  ?Vaping Use  ? Vaping Use: Never used  ?Substance and Sexual Activity  ? Alcohol use: No  ? Drug use: No  ? Sexual activity: Not on file  ?Other Topics Concern  ? Not on file  ?Social History Narrative  ? Rents with family. Not on food stamps.  ? ?Social Determinants of Health  ? ?Financial Resource Strain: Not on file  ?Food Insecurity: Not on file  ?Transportation Needs: Not on file  ?Physical Activity: Not on file  ?Stress: Not on file  ?Social Connections: Not on file  ?Intimate Partner Violence: Not on file  ?  ?Current Outpatient Medications on File Prior to Visit  ?Medication Sig Dispense Refill  ? albuterol (PROVENTIL HFA) 108 (90 Base) MCG/ACT inhaler INHALE 1 PUFF INTO THE LUNGS ONCE EVERY 6 HOURS AS NEEDED FOR WHEEZING OR SHORTNESS OF BREATH. 20.1 g 1  ? atorvastatin (LIPITOR) 80 MG tablet TAKE ONE TABLET BY MOUTH EVERY DAY (Patient taking differently: Take 80 mg by  mouth daily.) 90 tablet 3  ? calcium-vitamin D (OSCAL WITH D) 500-5 MG-MCG tablet Take 2 tablets by mouth 2 (two) times daily. 120 tablet 1  ? carvedilol (COREG) 3.125 MG tablet Take 1 tablet by mouth 2 (two) times daily with a meal. (Patient taking differently: Take 3.125 mg by mouth 2 (two) times daily with a meal.) 180 tablet 3  ? cetirizine (ZYRTEC) 10 MG tablet TAKE ONE TABLET BY MOUTH ONCE EVERY DAY. 90 tablet 1  ? furosemide (LASIX) 20 MG tablet Take 1 tablet (20  mg total) by mouth once daily. 90 tablet 3  ? HYDROcodone-acetaminophen (NORCO) 5-325 MG tablet Take 1 tablet by mouth every 6 (six) hours as needed for moderate pain. 20 tablet 0  ? ibuprofen (ADVIL) 200 MG tablet Take 400 mg by mouth every 6 (six) hours as needed for mild pain.    ? losartan (COZAAR) 50 MG tablet TAKE ONE TABLET BY MOUTH ONCE EVERY DAY (Patient taking differently: Take 50 mg by mouth daily.) 90 tablet 3  ? Multiple Vitamin (MULTIVITAMIN WITH MINERALS) TABS tablet Take 1 tablet by mouth daily.    ? spironolactone (ALDACTONE) 25 MG tablet TAKE ONE TABLET BY MOUTH EVERY DAY (Patient taking differently: Take 25 mg by mouth daily.) 90 tablet 3  ? ?No current facility-administered medications on file prior to visit.  ?  ? ? ?HPI  ? ?Lindsey Gibson is a 61 y.o.-year-old female, referred by her PCP, Dr.Chaplin, for evaluation for multinodular goiter and suppressed TSH (suggestive of hyperthyroidism).  She had total thyroidectomy on 05/05/21 and she was subsequently started on thyroid hormone at 100 mcg po daily before breakfast. ? ?Thyroid U/S: shows enlarged multinodular thyroid with 4+ moderately suspicious nodules recommending biopsy. ? ?She also had uptake and scan done which was WNL. ? ? ?I reviewed pt's thyroid tests: ?Lab Results  ?Component Value Date  ? TSH 0.130 (L) 05/10/2021  ? TSH 0.377 05/03/2021  ? TSH 0.290 (L) 02/22/2021  ? TSH 0.280 (L) 01/04/2021  ? TSH 0.297 (L) 09/28/2020  ? TSH 0.292 (L) 06/29/2020  ? TSH 0.731 08/05/2019  ? TSH 0.751 05/27/2019  ? TSH 0.668 05/28/2018  ? TSH 0.714 07/10/2017  ? FREET4 1.78 (H) 05/10/2021  ?  ?No FH of thyroid ds. No FH of thyroid cancer. No h/o radiation tx to head or neck. ? ?No seaweed or kelp. No recent contrast studies. No steroid use. No herbal supplements. No Biotin supplements or Hair, Skin and Nails vitamins. ? ?Pt also has a history of HTN, COPD, MI. ? ?Review of systems ? ?Constitutional: + Minimally fluctuating body weight,  current Body  mass index is 34.45 kg/m?. , + fatigue, no subjective hyperthermia, no subjective hypothermia ?Eyes: no blurry vision, no xerophthalmia ?ENT: no sore throat, no nodules palpated in throat, no dysphagia/odynophagia, no hoarseness, + surgical incision to neck with surgical glue from recent total thyroidectomy- some tenderness ?Cardiovascular: no chest pain, no shortness of breath, no palpitations, no leg swelling ?Respiratory: no cough, no shortness of breath ?Gastrointestinal: no nausea/vomiting/diarrhea ?Musculoskeletal: no muscle/joint aches ?Skin: no rashes, no hyperemia, reports itching to face and neck since she started Levothyroxine ?Neurological: no tremors, no numbness, no tingling, no dizziness ?Psychiatric: no depression, no anxiety ? ?---------------------------------------------------------------------------------------------------------------------- ?Objective   ? ?BP 127/77   Pulse 66   Ht 5\' 11"  (1.803 m)   Wt 247 lb (112 kg)   BMI 34.45 kg/m?   ? ?BP Readings from Last 3 Encounters:  ?05/17/21 127/77  ?  05/16/21 137/63  ?05/10/21 121/78  ? ? ?Wt Readings from Last 3 Encounters:  ?05/17/21 247 lb (112 kg)  ?05/16/21 247 lb (112 kg)  ?05/10/21 246 lb 11.2 oz (111.9 kg)  ? ? ? ?Physical Exam- Limited ? ?Constitutional:  Body mass index is 34.45 kg/m?. , not in acute distress, normal state of mind ?Eyes:  EOMI, no exophthalmos ?Neck: Supple ?Thyroid: surgical incision to lower neck slightly edematous, approximated, closed with surgical glue ?Cardiovascular: RRR, no murmurs, rubs, or gallops, no edema ?Respiratory: Adequate breathing efforts, no crackles, rales, rhonchi, or wheezing ?Musculoskeletal: no gross deformities, strength intact in all four extremities, no gross restriction of joint movements ?Skin:  no rashes, no hyperemia ?Neurological: no tremor with outstretched hands ? ? ? Latest Reference Range & Units 09/28/20 09:59 01/04/21 09:47 02/22/21 09:34 05/03/21 10:38 05/10/21 10:48  ?TSH 0.450  - 4.500 uIU/mL 0.297 (L) 0.280 (L) 0.290 (L) 0.377 0.130 (L)  ?Triiodothyronine,Free,Serum 2.0 - 4.4 pg/mL     3.1  ?Triiodothyronine (T3) 71 - 180 ng/dL   132    ?T4,Free(Direct) 0.82 - 1.77 ng/dL     1.78

## 2021-05-18 ENCOUNTER — Other Ambulatory Visit: Payer: Self-pay

## 2021-05-18 ENCOUNTER — Telehealth: Payer: Self-pay | Admitting: Nurse Practitioner

## 2021-05-18 MED ORDER — LEVOTHYROXINE SODIUM 88 MCG PO TABS
88.0000 ug | ORAL_TABLET | Freq: Every day | ORAL | 1 refills | Status: DC
  Filled 2021-05-18: qty 30, 30d supply, fill #0
  Filled 2021-05-18: qty 90, 90d supply, fill #0
  Filled 2021-06-02: qty 30, 30d supply, fill #1
  Filled 2021-06-02 – 2021-06-12 (×3): qty 30, 30d supply, fill #0

## 2021-05-18 MED ORDER — SYNTHROID 88 MCG PO TABS: 88.0000 ug | ORAL_TABLET | Freq: Every day | ORAL | 1 refills | Status: DC

## 2021-05-18 NOTE — Addendum Note (Signed)
Addended by: Dani Gobble on: 05/18/2021 09:54 AM   Modules accepted: Orders

## 2021-05-18 NOTE — Telephone Encounter (Signed)
Lindsey Gibson with [facility] called and said that the RX for her Synthroid was checked brand name only. They only carry Generic. I spoke with [name] and she said that she had an allergic reaction and has to have brand name. [name] said she would have to send it [facility] and maybe have to pay a $25 copay. Franklin Sink said she would call the patient

## 2021-05-18 NOTE — Telephone Encounter (Signed)
Pt just called and is requesting to speak with you about this medication. [phone]

## 2021-05-18 NOTE — Telephone Encounter (Signed)
She spoke with [facility] and the branded Synthroid through them would be around $75 for a 90 day supply which is still too expensive for her.  I sent in Rx for generic to [facility] care pharmacy, will watch for allergic reaction now that lowered dose.

## 2021-05-18 NOTE — Telephone Encounter (Signed)
I have sent the script to [facility].  We shall see what happens

## 2021-06-02 ENCOUNTER — Other Ambulatory Visit: Payer: Self-pay

## 2021-06-02 ENCOUNTER — Other Ambulatory Visit: Payer: Self-pay | Admitting: Cardiovascular Disease

## 2021-06-02 MED ORDER — SPIRONOLACTONE 25 MG PO TABS
ORAL_TABLET | Freq: Every day | ORAL | 0 refills | Status: DC
  Filled 2021-06-02: qty 30, 30d supply, fill #0
  Filled 2021-06-02: qty 30, fill #0
  Filled 2021-06-05 – 2021-06-13 (×3): qty 30, 30d supply, fill #0

## 2021-06-02 MED ORDER — CARVEDILOL 3.125 MG PO TABS
ORAL_TABLET | Freq: Two times a day (BID) | ORAL | 0 refills | Status: DC
  Filled 2021-06-02 (×2): qty 60, fill #0
  Filled 2021-06-05 – 2021-06-12 (×2): qty 60, 30d supply, fill #0

## 2021-06-02 MED ORDER — ATORVASTATIN CALCIUM 80 MG PO TABS
ORAL_TABLET | Freq: Every day | ORAL | 0 refills | Status: DC
  Filled 2021-06-02 (×2): qty 30, fill #0
  Filled 2021-06-05 – 2021-06-12 (×2): qty 30, 30d supply, fill #0

## 2021-06-05 ENCOUNTER — Other Ambulatory Visit: Payer: Self-pay

## 2021-06-12 ENCOUNTER — Other Ambulatory Visit: Payer: Self-pay

## 2021-06-12 ENCOUNTER — Other Ambulatory Visit: Payer: Self-pay | Admitting: Cardiovascular Disease

## 2021-06-12 MED ORDER — LOSARTAN POTASSIUM 50 MG PO TABS
ORAL_TABLET | Freq: Every day | ORAL | 3 refills | Status: DC
  Filled 2021-06-12: qty 90, fill #0
  Filled 2021-06-13: qty 90, 90d supply, fill #0

## 2021-06-13 ENCOUNTER — Other Ambulatory Visit: Payer: Self-pay

## 2021-06-13 ENCOUNTER — Other Ambulatory Visit: Payer: Self-pay | Admitting: Internal Medicine

## 2021-06-13 MED ORDER — IBUPROFEN 200 MG PO TABS
400.0000 mg | ORAL_TABLET | Freq: Four times a day (QID) | ORAL | 0 refills | Status: AC | PRN
  Filled 2021-06-13 – 2022-03-08 (×4): qty 30, 4d supply, fill #0

## 2021-06-14 ENCOUNTER — Other Ambulatory Visit: Payer: Self-pay

## 2021-06-14 DIAGNOSIS — E049 Nontoxic goiter, unspecified: Secondary | ICD-10-CM

## 2021-06-14 NOTE — Addendum Note (Signed)
Addended by: Debby Bud on: 06/14/2021 12:47 PM   Modules accepted: Orders

## 2021-06-15 ENCOUNTER — Telehealth: Payer: Self-pay | Admitting: Nurse Practitioner

## 2021-06-15 DIAGNOSIS — E890 Postprocedural hypothyroidism: Secondary | ICD-10-CM

## 2021-06-15 LAB — COMPREHENSIVE METABOLIC PANEL
ALT: 26 IU/L (ref 0–32)
AST: 26 IU/L (ref 0–40)
Albumin/Globulin Ratio: 1.9 (ref 1.2–2.2)
Albumin: 4.8 g/dL (ref 3.8–4.8)
Alkaline Phosphatase: 97 IU/L (ref 44–121)
BUN/Creatinine Ratio: 11 — ABNORMAL LOW (ref 12–28)
BUN: 11 mg/dL (ref 8–27)
Bilirubin Total: 1.4 mg/dL — ABNORMAL HIGH (ref 0.0–1.2)
CO2: 20 mmol/L (ref 20–29)
Calcium: 9.4 mg/dL (ref 8.7–10.3)
Chloride: 105 mmol/L (ref 96–106)
Creatinine, Ser: 0.97 mg/dL (ref 0.57–1.00)
Globulin, Total: 2.5 g/dL (ref 1.5–4.5)
Glucose: 91 mg/dL (ref 70–99)
Potassium: 4.4 mmol/L (ref 3.5–5.2)
Sodium: 142 mmol/L (ref 134–144)
Total Protein: 7.3 g/dL (ref 6.0–8.5)
eGFR: 66 mL/min/{1.73_m2} (ref >59)

## 2021-06-15 LAB — CBC WITH DIFFERENTIAL/PLATELET
Basophils Absolute: 0 10*3/uL (ref 0.0–0.2)
Basos: 0 %
EOS (ABSOLUTE): 0.2 10*3/uL (ref 0.0–0.4)
Eos: 3 %
Hematocrit: 37.5 % (ref 34.0–46.6)
Hemoglobin: 12.1 g/dL (ref 11.1–15.9)
Immature Grans (Abs): 0 10*3/uL (ref 0.0–0.1)
Immature Granulocytes: 0 %
Lymphocytes Absolute: 2 10*3/uL (ref 0.7–3.1)
Lymphs: 32 %
MCH: 26.6 pg (ref 26.6–33.0)
MCHC: 32.3 g/dL (ref 31.5–35.7)
MCV: 82 fL (ref 79–97)
Monocytes Absolute: 0.4 10*3/uL (ref 0.1–0.9)
Monocytes: 6 %
Neutrophils Absolute: 3.6 10*3/uL (ref 1.4–7.0)
Neutrophils: 59 %
Platelets: 269 10*3/uL (ref 150–450)
RBC: 4.55 x10E6/uL (ref 3.77–5.28)
RDW: 14.2 % (ref 11.7–15.4)
WBC: 6.2 10*3/uL (ref 3.4–10.8)

## 2021-06-15 LAB — HEMOGLOBIN A1C
Est. average glucose Bld gHb Est-mCnc: 120 mg/dL
Hgb A1c MFr Bld: 5.8 % — ABNORMAL HIGH (ref 4.8–5.6)

## 2021-06-15 LAB — URINALYSIS, ROUTINE W REFLEX MICROSCOPIC
Bilirubin, UA: NEGATIVE
Glucose, UA: NEGATIVE
Ketones, UA: NEGATIVE
Nitrite, UA: NEGATIVE
Specific Gravity, UA: 1.016 (ref 1.005–1.030)
Urobilinogen, Ur: 1 mg/dL (ref 0.2–1.0)
pH, UA: 6 (ref 5.0–7.5)

## 2021-06-15 LAB — LIPID PANEL
Chol/HDL Ratio: 3.6 ratio (ref 0.0–4.4)
Cholesterol, Total: 142 mg/dL (ref 100–199)
HDL: 40 mg/dL (ref >39)
LDL Chol Calc (NIH): 65 mg/dL (ref 0–99)
Triglycerides: 228 mg/dL — ABNORMAL HIGH (ref 0–149)
VLDL Cholesterol Cal: 37 mg/dL (ref 5–40)

## 2021-06-15 LAB — MICROSCOPIC EXAMINATION
Casts: NONE SEEN /lpf
RBC, Urine: NONE SEEN /hpf (ref 0–2)
WBC, UA: >30 /hpf — AB (ref 0–5)

## 2021-06-15 LAB — T4: T4, Total: 7.3 ug/dL (ref 4.5–12.0)

## 2021-06-15 LAB — TSH: TSH: 6.13 u[IU]/mL — ABNORMAL HIGH (ref 0.450–4.500)

## 2021-06-15 LAB — T4, FREE: Free T4: 1.03 ng/dL (ref 0.82–1.77)

## 2021-06-15 NOTE — Telephone Encounter (Signed)
Pt called and is requesting to speak with [name] about her TSH level increasing that she had blood drawn yesterday. Informed patient Alphonzo Lemmings would be back on Monday.

## 2021-06-20 ENCOUNTER — Other Ambulatory Visit: Payer: Self-pay

## 2021-06-20 MED ORDER — LEVOTHYROXINE SODIUM 100 MCG PO TABS
100.0000 ug | ORAL_TABLET | Freq: Every day | ORAL | 1 refills | Status: DC
  Filled 2021-06-20: qty 90, 90d supply, fill #0
  Filled 2021-08-15 – 2021-09-13 (×2): qty 90, 90d supply, fill #1

## 2021-06-20 NOTE — Telephone Encounter (Signed)
Pt notified and agrees. She will have labs done at the [facility] prior to next appt.

## 2021-06-20 NOTE — Telephone Encounter (Signed)
We will need to increase her Levothyroxine to 100 mcg.  Ill send it in.  Push out appt for another 6 weeks so we can repeat labs (I put those in also)

## 2021-06-21 ENCOUNTER — Other Ambulatory Visit: Payer: Self-pay

## 2021-06-21 ENCOUNTER — Ambulatory Visit: Payer: Self-pay | Admitting: Gerontology

## 2021-06-21 ENCOUNTER — Encounter: Payer: Self-pay | Admitting: Gerontology

## 2021-06-21 VITALS — BP 129/80 | HR 67 | Temp 97.6°F | Resp 16 | Ht 71.0 in | Wt 250.7 lb

## 2021-06-21 DIAGNOSIS — E890 Postprocedural hypothyroidism: Secondary | ICD-10-CM

## 2021-06-21 DIAGNOSIS — R8290 Unspecified abnormal findings in urine: Secondary | ICD-10-CM

## 2021-06-21 DIAGNOSIS — R7303 Prediabetes: Secondary | ICD-10-CM | POA: Insufficient documentation

## 2021-06-21 DIAGNOSIS — Z0000 Encounter for general adult medical examination without abnormal findings: Secondary | ICD-10-CM | POA: Insufficient documentation

## 2021-06-21 NOTE — Patient Instructions (Signed)

## 2021-06-21 NOTE — Progress Notes (Signed)
Established Patient Office Visit  Subjective   Patient ID: Lindsey Gibson, female    DOB: [birth year]  Age: 61 y.o. MRN: [id number]  Chief Complaint  Patient presents with   Follow-up    Previous Dr. Mable Fill patient    HPI  Lindsey Gibson is a 61 y/o female who has history of total thyroidectomy, anemia, anxiety, arthritis, asthma, CAD, COPD, hyperlipidemia, hypertension, ischemic cardiomyopathy, morbid obesity, myocardial infarction, who presents for follow-up visit and lab review.  She states that she is compliant with her medications, and continues to make healthy lifestyle changes.  She continues on 100 mcg levothyroxine, and had TSH done on 06/14/2021 was 6.130 and she will follow-up with endocrinology in July.  She denies heat/cold intolerance, constipation and weight gain.  Her hemoglobin A1c was 5.8% and she declines metformin therapy.  Her urinalysis was positive for leukocytes, protein and RBC, but she denies dysuria, urinary frequency, or urgency, pelvic and flank pain.  Overall, she states that she is doing well and offers no further complaint.     Review of Systems  Constitutional: Negative.   Eyes: Negative.   Respiratory: Negative.    Cardiovascular: Negative.   Genitourinary: Negative.   Skin: Negative.   Neurological: Negative.   Psychiatric/Behavioral: Negative.        Objective:     BP 129/80 (BP Location: Right Arm, Patient Position: Sitting, Cuff Size: Large)   Pulse 67   Temp 97.6 F (36.4 C) (Oral)   Resp 16   Ht 5' 11"  (1.803 m)   Wt 250 lb 11.2 oz (113.7 kg)   SpO2 96%   BMI 34.97 kg/m  BP Readings from Last 3 Encounters:  06/21/21 129/80  06/14/21 139/74  05/17/21 127/77   Wt Readings from Last 3 Encounters:  06/21/21 250 lb 11.2 oz (113.7 kg)  06/14/21 250 lb 3.2 oz (113.5 kg)  05/17/21 247 lb (112 kg)    Encouraged weight loss  Physical Exam HENT:     Head: Normocephalic and atraumatic.     Mouth/Throat:     Mouth: Mucous  membranes are moist.  Eyes:     Extraocular Movements: Extraocular movements intact.     Conjunctiva/sclera: Conjunctivae normal.     Pupils: Pupils are equal, round, and reactive to light.  Cardiovascular:     Rate and Rhythm: Normal rate and regular rhythm.     Pulses: Normal pulses.     Heart sounds: Normal heart sounds.  Pulmonary:     Effort: Pulmonary effort is normal.     Breath sounds: Normal breath sounds.  Skin:    General: Skin is warm.  Neurological:     General: No focal deficit present.     Mental Status: She is alert and oriented to person, place, and time. Mental status is at baseline.  Psychiatric:        Mood and Affect: Mood normal.        Behavior: Behavior normal.        Thought Content: Thought content normal.        Judgment: Judgment normal.      No results found for any visits on 06/21/21.  Last CBC Lab Results  Component Value Date   WBC 6.2 06/14/2021   HGB 12.1 06/14/2021   HCT 37.5 06/14/2021   MCV 82 06/14/2021   MCH 26.6 06/14/2021   RDW 14.2 06/14/2021   PLT 269 97/41/6384   Last metabolic panel Lab Results  Component Value Date  GLUCOSE 91 06/14/2021   NA 142 06/14/2021   K 4.4 06/14/2021   CL 105 06/14/2021   CO2 20 06/14/2021   BUN 11 06/14/2021   CREATININE 0.97 06/14/2021   EGFR 66 06/14/2021   CALCIUM 9.4 06/14/2021   PHOS 3.5 05/06/2021   PROT 7.3 06/14/2021   ALBUMIN 4.8 06/14/2021   LABGLOB 2.5 06/14/2021   AGRATIO 1.9 06/14/2021   BILITOT 1.4 (H) 06/14/2021   ALKPHOS 97 06/14/2021   AST 26 06/14/2021   ALT 26 06/14/2021   ANIONGAP 6 05/06/2021   Last lipids Lab Results  Component Value Date   CHOL 142 06/14/2021   HDL 40 06/14/2021   LDLCALC 65 06/14/2021   TRIG 228 (H) 06/14/2021   CHOLHDL 3.6 06/14/2021   Last hemoglobin A1c Lab Results  Component Value Date   HGBA1C 5.8 (H) 06/14/2021   Last thyroid functions Lab Results  Component Value Date   TSH 6.130 (H) 06/14/2021   T3TOTAL 132  02/22/2021   T4TOTAL 7.3 06/14/2021      The ASCVD Risk score (Arnett DK, et al., 2019) failed to calculate for the following reasons:   The patient has a prior MI or stroke diagnosis    Assessment & Plan:   1. S/P total thyroidectomy -She will continue on on 100 mcg of levothyroxine and will follow-up with endocrinology in July. - TSH; Future  2. Abnormal urinalysis -We will recheck urine during her next appointment, she was unable to provide urine sample during visit.  But she denies any symptoms. - Urinalysis; Future  3. Prediabetes -Her hemoglobin A1c was 5.8%, she declines metformin therapy, was encouraged to continue on low carbohydrates/non concentrated sweet diet and exercise as tolerated.  4. Health care maintenance -She was provided with the [facility] flyer and encouraged to call and schedule for mammogram and Pap smear, was provided with stool card for FIT screening for colorectal cancer since she declined colonoscopy.  Routine labs will be checked prior to next visit. - Comp Met (CMET); Future - Lipid panel; Future - CBC w/Diff; Future   No follow-ups on file.    Fany Cavanaugh Jerold Coombe, NP

## 2021-06-23 ENCOUNTER — Other Ambulatory Visit: Payer: Self-pay

## 2021-06-27 ENCOUNTER — Other Ambulatory Visit: Payer: Self-pay

## 2021-07-12 ENCOUNTER — Other Ambulatory Visit: Payer: Self-pay

## 2021-07-12 DIAGNOSIS — E890 Postprocedural hypothyroidism: Secondary | ICD-10-CM

## 2021-07-12 DIAGNOSIS — Z0000 Encounter for general adult medical examination without abnormal findings: Secondary | ICD-10-CM

## 2021-07-12 DIAGNOSIS — R8290 Unspecified abnormal findings in urine: Secondary | ICD-10-CM

## 2021-07-12 NOTE — Progress Notes (Signed)
fobt

## 2021-07-13 ENCOUNTER — Other Ambulatory Visit: Payer: Self-pay

## 2021-07-13 ENCOUNTER — Other Ambulatory Visit: Payer: Self-pay | Admitting: Gerontology

## 2021-07-13 DIAGNOSIS — N390 Urinary tract infection, site not specified: Secondary | ICD-10-CM

## 2021-07-13 LAB — LIPID PANEL
Chol/HDL Ratio: 3.4 ratio (ref 0.0–4.4)
Cholesterol, Total: 140 mg/dL (ref 100–199)
HDL: 41 mg/dL (ref >39)
LDL Chol Calc (NIH): 65 mg/dL (ref 0–99)
Triglycerides: 204 mg/dL — ABNORMAL HIGH (ref 0–149)
VLDL Cholesterol Cal: 34 mg/dL (ref 5–40)

## 2021-07-13 LAB — URINALYSIS
Bilirubin, UA: NEGATIVE
Glucose, UA: NEGATIVE
Ketones, UA: NEGATIVE
Nitrite, UA: NEGATIVE
Protein,UA: NEGATIVE
Specific Gravity, UA: 1.013 (ref 1.005–1.030)
Urobilinogen, Ur: 0.2 mg/dL (ref 0.2–1.0)
pH, UA: 7.5 (ref 5.0–7.5)

## 2021-07-13 LAB — CBC WITH DIFFERENTIAL/PLATELET
Basophils Absolute: 0 10*3/uL (ref 0.0–0.2)
Basos: 0 %
EOS (ABSOLUTE): 0.2 10*3/uL (ref 0.0–0.4)
Eos: 3 %
Hematocrit: 36.8 % (ref 34.0–46.6)
Hemoglobin: 11.6 g/dL (ref 11.1–15.9)
Immature Grans (Abs): 0 10*3/uL (ref 0.0–0.1)
Immature Granulocytes: 0 %
Lymphocytes Absolute: 1.9 10*3/uL (ref 0.7–3.1)
Lymphs: 34 %
MCH: 26.1 pg — ABNORMAL LOW (ref 26.6–33.0)
MCHC: 31.5 g/dL (ref 31.5–35.7)
MCV: 83 fL (ref 79–97)
Monocytes Absolute: 0.3 10*3/uL (ref 0.1–0.9)
Monocytes: 6 %
Neutrophils Absolute: 3.1 10*3/uL (ref 1.4–7.0)
Neutrophils: 57 %
Platelets: 232 10*3/uL (ref 150–450)
RBC: 4.44 x10E6/uL (ref 3.77–5.28)
RDW: 14.5 % (ref 11.7–15.4)
WBC: 5.6 10*3/uL (ref 3.4–10.8)

## 2021-07-13 LAB — COMPREHENSIVE METABOLIC PANEL
ALT: 30 IU/L (ref 0–32)
AST: 33 IU/L (ref 0–40)
Albumin/Globulin Ratio: 1.5 (ref 1.2–2.2)
Albumin: 4.3 g/dL (ref 3.9–4.9)
Alkaline Phosphatase: 92 IU/L (ref 44–121)
BUN/Creatinine Ratio: 18 (ref 12–28)
BUN: 15 mg/dL (ref 8–27)
Bilirubin Total: 1.1 mg/dL (ref 0.0–1.2)
CO2: 18 mmol/L — ABNORMAL LOW (ref 20–29)
Calcium: 9.6 mg/dL (ref 8.7–10.3)
Chloride: 107 mmol/L — ABNORMAL HIGH (ref 96–106)
Creatinine, Ser: 0.82 mg/dL (ref 0.57–1.00)
Globulin, Total: 2.8 g/dL (ref 1.5–4.5)
Glucose: 99 mg/dL (ref 70–99)
Potassium: 4.6 mmol/L (ref 3.5–5.2)
Sodium: 145 mmol/L — ABNORMAL HIGH (ref 134–144)
Total Protein: 7.1 g/dL (ref 6.0–8.5)
eGFR: 81 mL/min/{1.73_m2} (ref >59)

## 2021-07-13 LAB — TSH: TSH: 2.03 u[IU]/mL (ref 0.450–4.500)

## 2021-07-13 MED ORDER — SULFAMETHOXAZOLE-TRIMETHOPRIM 800-160 MG PO TABS
1.0000 | ORAL_TABLET | Freq: Two times a day (BID) | ORAL | 0 refills | Status: DC
  Filled 2021-07-13: qty 14, 7d supply, fill #0

## 2021-07-14 LAB — FECAL OCCULT BLOOD, IMMUNOCHEMICAL: Fecal Occult Bld: NEGATIVE

## 2021-07-18 ENCOUNTER — Ambulatory Visit (INDEPENDENT_AMBULATORY_CARE_PROVIDER_SITE_OTHER): Payer: Self-pay | Admitting: Nurse Practitioner

## 2021-07-18 ENCOUNTER — Encounter: Payer: Self-pay | Admitting: Nurse Practitioner

## 2021-07-18 VITALS — BP 126/72 | HR 71 | Ht 71.0 in | Wt 247.0 lb

## 2021-07-18 DIAGNOSIS — E042 Nontoxic multinodular goiter: Secondary | ICD-10-CM

## 2021-07-18 DIAGNOSIS — E890 Postprocedural hypothyroidism: Secondary | ICD-10-CM

## 2021-07-18 NOTE — Progress Notes (Signed)
Endocrinology Follow Up Note 07/18/21    ---------------------------------------------------------------------------------------------------------------------- Subjective    Past Medical History:  Diagnosis Date   Anemia    Anxiety    Arthritis    Asthma    CAD (coronary artery disease)    a. 12/2016 NSTEMI/PCI: LM nl, LAD 30ost, 75m (IVUS->severe plaque burden->3.0 x 18 [place] DES), D1/2/3 nl, LCX 30p, OM1/2/3 nl, RCA nl, RPDA/RPAV/RPL1/2 nl.   COPD (chronic obstructive pulmonary disease) (HCC)    Hyperlipidemia    Hypertension    Ischemic cardiomyopathy    a. 12/2016 Echo: EF 20-25%, mid-apicalanteroseptal, ant, and apical AK, GR1 DD, triv AI, mildly dil LA; b. 04/2017 Echo: EF 50-55%, Gr2 DD, Nl RV fxn.   Morbid obesity (HCC)    Myocardial infarction (HCC) 2018    Past Surgical History:  Procedure Laterality Date   CORONARY STENT INTERVENTION N/A 12/27/2016   Procedure: CORONARY STENT INTERVENTION;  Surgeon: Iran Ouch, MD;  Location: [facility];  Service: Cardiovascular;  Laterality: N/A;   INTRAVASCULAR ULTRASOUND/IVUS N/A 12/27/2016   Procedure: Intravascular Ultrasound/IVUS;  Surgeon: Iran Ouch, MD;  Location: [facility];  Service: Cardiovascular;  Laterality: N/A;   KNEE ARTHROSCOPY Right    LEFT HEART CATH AND CORONARY ANGIOGRAPHY N/A 12/27/2016   Procedure: LEFT HEART CATH AND CORONARY ANGIOGRAPHY;  Surgeon: Iran Ouch, MD;  Location: [facility];  Service: Cardiovascular;  Laterality: N/A;   SHOULDER ARTHROSCOPY WITH SUBACROMIAL DECOMPRESSION Right 11/22/2015   Procedure: SHOULDER ARTHROSCOPY WITH DEBRIDEMENT, DECOMPRESSION  AND BICEPS TENODESIS;  Surgeon: Christena Flake, MD;  Location: [facility];  Service: Orthopedics;  Laterality: Right;   THYROIDECTOMY N/A 05/05/2021   Procedure: THYROIDECTOMY, TOTAL;  Surgeon: Franky Macho, MD;  Location: [facility];  Service: General;  Laterality: N/A;   TONSILLECTOMY     ULNAR NERVE  TRANSPOSITION Right 11/22/2015   Procedure: ULNAR NERVE DECOMPRESSION/TRANSPOSITION;  Surgeon: Christena Flake, MD;  Location: [facility];  Service: Orthopedics;  Laterality: Right;    Social History   Socioeconomic History   Marital status: Married    Spouse name: Not on file   Number of children: Not on file   Years of education: Not on file   Highest education level: Not on file  Occupational History   Occupation: none  Tobacco Use   Smoking status: Former    Packs/day: 1.00    Years: 18.00    Total pack years: 18.00    Types: Cigarettes    Quit date: 13    Years since quitting: 25.5   Smokeless tobacco: Never  Vaping Use   Vaping Use: Never used  Substance and Sexual Activity   Alcohol use: No   Drug use: No   Sexual activity: Not on file  Other Topics Concern   Not on file  Social History Narrative   Rents with family. Not on food stamps.   Social Determinants of Health   Financial Resource Strain: Low Risk  (05/29/2017)   Overall Financial Resource Strain (CARDIA)    Difficulty of Paying Living Expenses: Not hard at all  Food Insecurity: No Food Insecurity (06/21/2021)   Hunger Vital Sign    Worried About Running Out of Food in the Last Year: Never true    Ran Out of Food in the Last Year: Never true  Transportation Needs: No Transportation Needs (06/21/2021)   PRAPARE - Administrator, Civil Service (Medical): No    Lack of Transportation (Non-Medical): No  Physical  Activity: Sufficiently Active (05/29/2017)   Exercise Vital Sign    Days of Exercise per Week: 3 days    Minutes of Exercise per Session: 60 min  Stress: No Stress Concern Present (05/29/2017)   [place]    Feeling of Stress : Not at all  Social Connections: Moderately Integrated (05/29/2017)   Social Connection and Isolation Panel [NHANES]    Frequency of Communication with Friends and Family: More than three times a week     Frequency of Social Gatherings with Friends and Family: More than three times a week    Attends Religious Services: More than 4 times per year    Active Member of [employer] or Organizations: No    Attends Archivist Meetings: Never    Marital Status: Married  Human resources officer Violence: Not At Risk (05/29/2017)   Humiliation, Afraid, Rape, and Kick questionnaire    Fear of Current or Ex-Partner: No    Emotionally Abused: No    Physically Abused: No    Sexually Abused: No    Current Outpatient Medications on File Prior to Visit  Medication Sig Dispense Refill   albuterol (PROVENTIL HFA) 108 (90 Base) MCG/ACT inhaler INHALE 1 PUFF INTO THE LUNGS ONCE EVERY 6 HOURS AS NEEDED FOR WHEEZING OR SHORTNESS OF BREATH. 20.1 g 1   atorvastatin (LIPITOR) 80 MG tablet TAKE ONE TABLET BY MOUTH EVERY DAY 30 tablet 0   carvedilol (COREG) 3.125 MG tablet Take 1 tablet by mouth 2 (two) times daily with a meal. 60 tablet 0   cetirizine (ZYRTEC) 10 MG tablet TAKE ONE TABLET BY MOUTH ONCE EVERY DAY. 90 tablet 1   furosemide (LASIX) 20 MG tablet Take 1 tablet (20 mg total) by mouth once daily. 90 tablet 3   ibuprofen (ADVIL) 200 MG tablet Take 2 tablets (400 mg total) by mouth every 6 (six) hours as needed for mild pain. 30 tablet 0   levothyroxine (SYNTHROID) 100 MCG tablet Take 1 tablet (100 mcg total) by mouth daily before breakfast. 90 tablet 1   losartan (COZAAR) 50 MG tablet TAKE ONE TABLET BY MOUTH ONCE EVERY DAY 90 tablet 3   Multiple Vitamin (MULTIVITAMIN WITH MINERALS) TABS tablet Take 1 tablet by mouth daily.     spironolactone (ALDACTONE) 25 MG tablet TAKE ONE TABLET BY MOUTH EVERY DAY 30 tablet 0   sulfamethoxazole-trimethoprim (BACTRIM DS) 800-160 MG tablet Take 1 tablet by mouth 2 (two) times daily. 14 tablet 0   No current facility-administered medications on file prior to visit.      HPI   Lindsey Gibson is a 61 y.o.-year-old female, referred by her PCP, Dr.Chaplin, for  evaluation for multinodular goiter and suppressed TSH (suggestive of hyperthyroidism).  She had total thyroidectomy on 05/05/21 and she was subsequently started on thyroid hormone at 100 mcg po daily before breakfast.  Thyroid U/S: shows enlarged multinodular thyroid with 4+ moderately suspicious nodules recommending biopsy.  She also had uptake and scan done which was WNL.   I reviewed pt's thyroid tests: Lab Results  Component Value Date   TSH 2.030 07/12/2021   TSH 6.130 (H) 06/14/2021   TSH 0.130 (L) 05/10/2021   TSH 0.377 05/03/2021   TSH 0.290 (L) 02/22/2021   TSH 0.280 (L) 01/04/2021   TSH 0.297 (L) 09/28/2020   TSH 0.292 (L) 06/29/2020   TSH 0.731 08/05/2019   TSH 0.751 05/27/2019   FREET4 1.03 06/14/2021   FREET4 1.78 (H) 05/10/2021  No FH of thyroid ds. No FH of thyroid cancer. No h/o radiation tx to head or neck.  No seaweed or kelp. No recent contrast studies. No steroid use. No herbal supplements. No Biotin supplements or Hair, Skin and Nails vitamins.  Pt also has a history of HTN, COPD, MI.  Review of systems  Constitutional: + Minimally fluctuating body weight,  current Body mass index is 34.45 kg/m. , + fatigue, no subjective hyperthermia, no subjective hypothermia Eyes: no blurry vision, no xerophthalmia ENT: no sore throat, no nodules palpated in throat, no dysphagia/odynophagia, no hoarseness,  Cardiovascular: no chest pain, no shortness of breath, no palpitations, no leg swelling Respiratory: no cough, no shortness of breath Gastrointestinal: no nausea/vomiting/diarrhea Musculoskeletal: + diffuse muscle/joint aches Skin: no rashes, no hyperemia, reports itching to face and neck has resolved Neurological: no tremors, + mild numbness/tingling to fingers bilaterally, no dizziness Psychiatric: no depression, no anxiety  ---------------------------------------------------------------------------------------------------------------------- Objective    BP  126/72   Pulse 71   Ht 5\' 11"  (1.803 m)   Wt 247 lb (112 kg)   BMI 34.45 kg/m    BP Readings from Last 3 Encounters:  07/18/21 126/72  07/12/21 139/79  06/21/21 129/80    Wt Readings from Last 3 Encounters:  07/18/21 247 lb (112 kg)  07/12/21 249 lb (112.9 kg)  06/21/21 250 lb 11.2 oz (113.7 kg)     Physical Exam- Limited  Constitutional:  Body mass index is 34.45 kg/m. , not in acute distress, normal state of mind Eyes:  EOMI, no exophthalmos Neck: Supple Cardiovascular: RRR, no murmurs, rubs, or gallops, no edema Respiratory: Adequate breathing efforts, no crackles, rales, rhonchi, or wheezing Musculoskeletal: no gross deformities, strength intact in all four extremities, no gross restriction of joint movements Skin:  no rashes, no hyperemia Neurological: no tremor with outstretched hands    Latest Reference Range & Units 02/22/21 09:34 05/03/21 10:38 05/10/21 10:48 06/14/21 12:34 07/12/21 09:55  TSH 0.450 - 4.500 uIU/mL 0.290 (L) 0.377 0.130 (L) 6.130 (H) 2.030  Triiodothyronine,Free,Serum 2.0 - 4.4 pg/mL   3.1    Triiodothyronine (T3) 71 - 180 ng/dL 09/12/21      073) 0.82 - 1.77 ng/dL   X1,GGYI(RSWNIO (H) 2.70   Thyroxine (T4) 4.5 - 12.0 ug/dL    7.3   (L): Data is abnormally low (H): Data is abnormally high   ------------------------------------------------------------------------------------------------------------------------ Uptake and Scan from 02/17/21 CLINICAL DATA:  Suppressed TSH, weight loss, increased nervousness   EXAM: THYROID SCAN AND UPTAKE - 4 AND 24 HOURS   TECHNIQUE: Following oral administration of I-123 capsule, anterior planar imaging was acquired at 24 hours. Thyroid uptake was calculated with a thyroid probe at 4-6 hours and 24 hours.   RADIOPHARMACEUTICALS:  293.2 uCi I-123 sodium iodide p.o.   COMPARISON:  None   FINDINGS: Thyroid lobes appear enlarged bilaterally.   Both lobes demonstrate large cold masses involving the mid  to inferior lobes.   4 hour I-123 uptake = 5.5% (normal 5-20%)   24 hour I-123 uptake = 18.4% (normal 10-30%)   IMPRESSION: Normal 4 hour and 24 hour radio iodine uptakes.   Large cold masses within the mid to inferior thyroid lobes bilaterally; thyroid ultrasound assessment recommended to exclude neoplasm.     Electronically Signed   By: 02/19/21 M.D.   On: 02/17/2021 10:35  ---------------------------------------------------------------------------------------------------------------------------------- Thyroid 02/19/2021 from 03/07/21 CLINICAL DATA:  Prior ultrasound follow-up.   EXAM: THYROID ULTRASOUND   TECHNIQUE: Ultrasound examination of the thyroid gland and  adjacent soft tissues was performed.   COMPARISON:  Nuclear medicine thyroid scan February 2023   FINDINGS: Parenchymal Echotexture: Thyroid tissue largely replaced by numerous masses   Isthmus: 0.5 cm   Right lobe: 8.0 x 3.7 x 4.1 cm   Left lobe: 7.5 x 3.6 x 3.8 cm   _________________________________________________________   Estimated total number of nodules >/= 1 cm: >10   Number of spongiform nodules >/=  2 cm not described below (TR1): 0   Number of mixed cystic and solid nodules >/= 1.5 cm not described below (TR2): 0   _________________________________________________________   Nodule labeled 1 is a solid isoechoic TR 3 nodule in the right thyroid lobe measuring 2.3 x 2.2 x 1.6 cm. *Given size (>/= 1.5 - 2.4 cm) and appearance, a follow-up ultrasound in 1 year should be considered based on TI-RADS criteria.   Nodule labeled 2 is a solid isoechoic TR 3 nodule in the right thyroid lobe measuring 2.1 x 1.9 x 1.7 cm. *Given size (>/= 1.5 - 2.4 cm) and appearance, a follow-up ultrasound in 1 year should be considered based on TI-RADS criteria.   Nodule labeled 3 is a solid isoechoic TR 3 nodule in the right thyroid lobe measuring 2.2 x 2.1 x 1.7 cm. *Given size (>/= 1.5 - 2.4 cm) and appearance,  a follow-up ultrasound in 1 year should be considered based on TI-RADS criteria.   Nodule labeled 4 is a solid hypoechoic TR 4 nodule in the right thyroid lobe measuring 2.1 x 2.0 x 1.8 cm. **Given size (>/= 1.5 cm) and appearance, fine needle aspiration of this moderately suspicious nodule should be considered based on TI-RADS criteria.   Nodule labeled 5 is a solid hypoechoic TR 4 nodule in the right thyroid lobe measuring 2.3 x 2.0 x 1.8 cm. **Given size (>/= 1.5 cm) and appearance, fine needle aspiration of this moderately suspicious nodule should be considered based on TI-RADS criteria.   Nodule labeled 6 is a solid isoechoic TR 3 nodule in the right thyroid lobe measuring 1.4 x 1.2 x 1.0 cm. Given size (<1.4 cm) and appearance, this nodule does NOT meet TI-RADS criteria for biopsy or dedicated follow-up.   Nodule labeled 7 is a solid isoechoic TR 3 nodule in the left thyroid lobe measuring 2.9 x 2.2 x 2.0 cm. **Given size (>/= 2.5 cm) and appearance, fine needle aspiration of this mildly suspicious nodule should be considered based on TI-RADS criteria.   Nodule labeled 8 is a solid isoechoic TR 3 nodule in the left thyroid lobe measuring 2.5 x 2.0 x 1.6 cm. **Given size (>/= 2.5 cm) and appearance, fine needle aspiration of this mildly suspicious nodule should be considered based on TI-RADS criteria.   Nodule labeled 9 is a solid isoechoic TR 3 nodule in the left thyroid lobe measuring 1.9 x 1.8 x 1.8 cm. *Given size (>/= 1.5 - 2.4 cm) and appearance, a follow-up ultrasound in 1 year should be considered based on TI-RADS criteria.   Nodule labeled 10 is a small mixed cystic and solid nodule in the left thyroid lobe measuring 1.1 cm. This nodule does NOT meet TI-RADS criteria for biopsy or dedicated follow-up.   IMPRESSION: 1. Enlarged multinodular thyroid gland with numerous nodules essentially replacing the normal thyroid parenchyma. 2. Nodules labeled 4, 5, 7 and 8  meet criteria for biopsy. Per ACR TI-RADS criteria, the 2 most suspicious nodules should be biopsied, which are the nodules labeled 4 and 5 (both of these are TR  4 nodules in the right thyroid lobe). 3. Nodules labeled 1, 2, 3 and 9 meet criteria for follow-up ultrasound in 1 year.   The above is in keeping with the ACR TI-RADS recommendations - J Am Coll Radiol 2017;14:587-595.     Electronically Signed   By: Albin Felling M.D.   On: 03/08/2021 11:11 ----------------------------------------------------------------------------------------------------------------------  ASSESSMENT / PLAN:  1. Multinodular goiter-s/p total thyroidectomy 2. Postsurgical hypothyroidism  She is s/p total thyroidectomy on 05/05/21 by Dr. Aviva Signs.    Her previsit thyroid function tests (only TSH was drawn) is consistent with appropriate hormone replacement.  She is advised to continue Levothyroxine 100 mcg po daily before breakfast.  She has tolerated this form well.   - The correct intake of thyroid hormone (Levothyroxine, Synthroid), is on empty stomach first thing in the morning, with water, separated by at least 30 minutes from breakfast and other medications,  and separated by more than 4 hours from calcium, iron, multivitamins, acid reflux medications (PPIs).  - This medication is a life-long medication and will be needed to correct thyroid hormone imbalances for the rest of your life.  The dose may change from time to time, based on thyroid blood work.  - It is extremely important to be consistent taking this medication, near the same time each morning.  -AVOID TAKING PRODUCTS CONTAINING BIOTIN (commonly found in Hair, Skin, Nails vitamins) AS IT INTERFERES WITH THE VALIDITY OF THYROID FUNCTION BLOOD TESTS.    Follow Up Plan:  Return in about 2 months (around 09/18/2021) for Thyroid follow up, Previsit labs, Virtual visit ok.     I spent 20 minutes in the care of the patient today  including review of labs from Thyroid Function, CMP, and other relevant labs ; imaging/biopsy records (current and previous including abstractions from other facilities); face-to-face time discussing  her lab results and symptoms, medications doses, her options of short and long term treatment based on the latest standards of care / guidelines;   and documenting the encounter.  Wilburn Cornelia  participated in the discussions, expressed understanding, and voiced agreement with the above plans.  All questions were answered to her satisfaction. she is encouraged to contact clinic should she have any questions or concerns prior to her return visit.   Rayetta Pigg, [facility] [address] [place], [place] [address] Phone: [phone] Fax: [phone]

## 2021-07-18 NOTE — Patient Instructions (Signed)

## 2021-07-20 ENCOUNTER — Other Ambulatory Visit: Payer: Self-pay | Admitting: Cardiovascular Disease

## 2021-07-20 ENCOUNTER — Other Ambulatory Visit: Payer: Self-pay

## 2021-07-20 MED ORDER — SPIRONOLACTONE 25 MG PO TABS
25.0000 mg | ORAL_TABLET | Freq: Every day | ORAL | 0 refills | Status: DC
  Filled 2021-07-20: qty 30, 30d supply, fill #0

## 2021-07-21 ENCOUNTER — Other Ambulatory Visit: Payer: Self-pay

## 2021-07-27 ENCOUNTER — Ambulatory Visit (INDEPENDENT_AMBULATORY_CARE_PROVIDER_SITE_OTHER): Payer: Self-pay | Admitting: Cardiovascular Disease

## 2021-07-27 ENCOUNTER — Encounter: Payer: Self-pay | Admitting: Cardiovascular Disease

## 2021-07-27 ENCOUNTER — Other Ambulatory Visit: Payer: Self-pay

## 2021-07-27 VITALS — BP 110/70 | HR 65 | Ht 71.0 in | Wt 252.2 lb

## 2021-07-27 DIAGNOSIS — I5022 Chronic systolic (congestive) heart failure: Secondary | ICD-10-CM

## 2021-07-27 DIAGNOSIS — E785 Hyperlipidemia, unspecified: Secondary | ICD-10-CM

## 2021-07-27 DIAGNOSIS — I2510 Atherosclerotic heart disease of native coronary artery without angina pectoris: Secondary | ICD-10-CM

## 2021-07-27 DIAGNOSIS — I10 Essential (primary) hypertension: Secondary | ICD-10-CM

## 2021-07-27 MED ORDER — CARVEDILOL 3.125 MG PO TABS
ORAL_TABLET | Freq: Two times a day (BID) | ORAL | 3 refills | Status: DC
  Filled 2021-07-27: qty 180, 90d supply, fill #0
  Filled 2021-08-15 – 2021-12-11 (×6): qty 180, 90d supply, fill #1
  Filled 2022-03-08 (×2): qty 180, 90d supply, fill #2
  Filled 2022-05-21: qty 180, 90d supply, fill #3

## 2021-07-27 MED ORDER — LOSARTAN POTASSIUM 50 MG PO TABS
ORAL_TABLET | Freq: Every day | ORAL | 3 refills | Status: DC
  Filled 2021-07-27: qty 90, 90d supply, fill #0
  Filled 2021-07-27 – 2021-08-15 (×2): qty 90, fill #0
  Filled 2021-09-13: qty 90, 90d supply, fill #0
  Filled 2021-11-29 – 2021-12-11 (×2): qty 90, 90d supply, fill #1
  Filled 2022-03-08 (×2): qty 90, 90d supply, fill #2
  Filled 2022-05-21 (×2): qty 90, 90d supply, fill #3

## 2021-07-27 MED ORDER — ATORVASTATIN CALCIUM 80 MG PO TABS
ORAL_TABLET | Freq: Every day | ORAL | 3 refills | Status: DC
  Filled 2021-07-27: qty 90, 90d supply, fill #0
  Filled 2021-08-15 – 2021-09-13 (×2): qty 90, 90d supply, fill #1
  Filled 2021-09-27 – 2021-12-11 (×3): qty 90, 90d supply, fill #2
  Filled 2022-03-08 (×2): qty 90, 90d supply, fill #3

## 2021-07-27 MED ORDER — SPIRONOLACTONE 25 MG PO TABS
25.0000 mg | ORAL_TABLET | Freq: Every day | ORAL | 3 refills | Status: DC
  Filled 2021-07-27 – 2021-08-15 (×3): qty 90, 90d supply, fill #0
  Filled 2021-09-13 – 2021-11-13 (×2): qty 90, 90d supply, fill #1
  Filled 2021-11-29 – 2022-02-12 (×4): qty 90, 90d supply, fill #2
  Filled 2022-03-08 – 2022-05-21 (×3): qty 90, 90d supply, fill #3

## 2021-07-27 NOTE — Progress Notes (Signed)
Cardiology Office Note   Date:  07/27/2021   ID:  Lindsey Gibson, DOB [birth year], MRN [id number]  PCP:  Rolm Gala, NP  Cardiologist:   Lorine Bears, MD   Chief Complaint  Patient presents with   Other    12 month f/u no complaints today. Meds reviewed verbally with pt.      History of Present Illness: Lindsey Gibson is a 61 y.o. female who presents for a follow-up visit regarding coronary artery disease and chronic systolic heart failure.  Other medical problems include hyperlipidemia, obesity and COPD. She was hospitalized in December, 2018 with pulmonary edema and non-ST elevation myocardial infarction.  Echocardiogram showed an EF of 20-25%.  Cardiac catheterization showed severe mid LAD disease which was treated with PCI and drug-eluting stent placement.  She did not tolerate Entresto due to hypotension and recurrent chest pain.   She underwent a repeat echocardiogram in April of 2019 which showed improvement in EF to 50-55%.  There was evidence of grade 2 diastolic dysfunction.  She was seen last year and was doing well.  I decreased her dose of furosemide to 20 mg daily.  He has done well since then with no chest pain, shortness of breath or palpitations.  She underwent thyroidectomy in May of this year due to thyroid goiter.  Past Medical History:  Diagnosis Date   Anemia    Anxiety    Arthritis    Asthma    CAD (coronary artery disease)    a. 12/2016 NSTEMI/PCI: LM nl, LAD 30ost, 35m (IVUS->severe plaque burden->3.0 x 18 Sierra DES), D1/2/3 nl, LCX 30p, OM1/2/3 nl, RCA nl, RPDA/RPAV/RPL1/2 nl.   COPD (chronic obstructive pulmonary disease) (HCC)    Hyperlipidemia    Hypertension    Ischemic cardiomyopathy    a. 12/2016 Echo: EF 20-25%, mid-apicalanteroseptal, ant, and apical AK, GR1 DD, triv AI, mildly dil LA; b. 04/2017 Echo: EF 50-55%, Gr2 DD, Nl RV fxn.   Morbid obesity (HCC)    Myocardial infarction (HCC) 2018    Past Surgical History:   Procedure Laterality Date   CORONARY STENT INTERVENTION N/A 12/27/2016   Procedure: CORONARY STENT INTERVENTION;  Surgeon: Iran Ouch, MD;  Location: [facility];  Service: Cardiovascular;  Laterality: N/A;   INTRAVASCULAR ULTRASOUND/IVUS N/A 12/27/2016   Procedure: Intravascular Ultrasound/IVUS;  Surgeon: Iran Ouch, MD;  Location: [facility];  Service: Cardiovascular;  Laterality: N/A;   KNEE ARTHROSCOPY Right    LEFT HEART CATH AND CORONARY ANGIOGRAPHY N/A 12/27/2016   Procedure: LEFT HEART CATH AND CORONARY ANGIOGRAPHY;  Surgeon: Iran Ouch, MD;  Location: [facility];  Service: Cardiovascular;  Laterality: N/A;   SHOULDER ARTHROSCOPY WITH SUBACROMIAL DECOMPRESSION Right 11/22/2015   Procedure: SHOULDER ARTHROSCOPY WITH DEBRIDEMENT, DECOMPRESSION  AND BICEPS TENODESIS;  Surgeon: Christena Flake, MD;  Location: [facility];  Service: Orthopedics;  Laterality: Right;   THYROIDECTOMY N/A 05/05/2021   Procedure: THYROIDECTOMY, TOTAL;  Surgeon: Franky Macho, MD;  Location: [facility];  Service: General;  Laterality: N/A;   TONSILLECTOMY     ULNAR NERVE TRANSPOSITION Right 11/22/2015   Procedure: ULNAR NERVE DECOMPRESSION/TRANSPOSITION;  Surgeon: Christena Flake, MD;  Location: [facility];  Service: Orthopedics;  Laterality: Right;     Current Outpatient Medications  Medication Sig Dispense Refill   albuterol (PROVENTIL HFA) 108 (90 Base) MCG/ACT inhaler INHALE 1 PUFF INTO THE LUNGS ONCE EVERY 6 HOURS AS NEEDED FOR WHEEZING OR SHORTNESS OF BREATH. 20.1  g 1   atorvastatin (LIPITOR) 80 MG tablet TAKE ONE TABLET BY MOUTH EVERY DAY 30 tablet 0   carvedilol (COREG) 3.125 MG tablet Take 1 tablet by mouth 2 (two) times daily with a meal. 60 tablet 0   cetirizine (ZYRTEC) 10 MG tablet TAKE ONE TABLET BY MOUTH ONCE EVERY DAY. 90 tablet 1   furosemide (LASIX) 20 MG tablet Take 1 tablet (20 mg total) by mouth once daily. 90 tablet 3   ibuprofen (ADVIL) 200 MG tablet  Take 2 tablets (400 mg total) by mouth every 6 (six) hours as needed for mild pain. 30 tablet 0   levothyroxine (SYNTHROID) 100 MCG tablet Take 1 tablet (100 mcg total) by mouth daily before breakfast. 90 tablet 1   losartan (COZAAR) 50 MG tablet TAKE ONE TABLET BY MOUTH ONCE EVERY DAY 90 tablet 3   Multiple Vitamin (MULTIVITAMIN WITH MINERALS) TABS tablet Take 1 tablet by mouth daily.     spironolactone (ALDACTONE) 25 MG tablet Take 1 tablet (25 mg total) by mouth daily. KEEP FOLLOW UP APPOINTMENT TO RECEIVE FURTHER REFILLS. 30 tablet 0   No current facility-administered medications for this visit.    Allergies:   Penicillins    Social History:  The patient  reports that she quit smoking about 25 years ago. Her smoking use included cigarettes. She has a 18.00 pack-year smoking history. She has never used smokeless tobacco. She reports that she does not drink alcohol and does not use drugs.   Family History:  The patient's family history includes Diabetes in her brother, mother, and sister; Heart disease in her mother and sister; Hypertension in her mother; Other in her father, maternal grandfather, maternal grandmother, paternal grandfather, and paternal grandmother.    ROS:  Please see the history of present illness.   Otherwise, review of systems are positive for none.   All other systems are reviewed and negative.    PHYSICAL EXAM: VS:  BP 110/70 (BP Location: Left Arm, Patient Position: Sitting, Cuff Size: Large)   Pulse 65   Ht 5\' 11"  (1.803 m)   Wt 252 lb 4 oz (114.4 kg)   SpO2 98%   BMI 35.18 kg/m  , BMI Body mass index is 35.18 kg/m. GEN: Well nourished, well developed, in no acute distress  HEENT: normal  Neck: no JVD, carotid bruits, or masses Cardiac: RRR; no murmurs, rubs, or gallops, no edema. Respiratory:  clear to auscultation bilaterally, normal work of breathing GI: soft, nontender, nondistended, + BS MS: no deformity or atrophy  Skin: warm and dry, no  rash Neuro:  Strength and sensation are intact Psych: euthymic mood, full affect   EKG:  EKG is ordered today. EKG showed normal sinus rhythm with no significant ST or T wave changes.   Recent Labs: 05/06/2021: Magnesium 2.0 07/12/2021: ALT 30; BUN 15; Creatinine, Ser 0.82; Hemoglobin 11.6; Platelets 232; Potassium 4.6; Sodium 145; TSH 2.030    Lipid Panel    Component Value Date/Time   CHOL 140 07/12/2021 0955   TRIG 204 (H) 07/12/2021 0955   HDL 41 07/12/2021 0955   CHOLHDL 3.4 07/12/2021 0955   CHOLHDL 4.7 12/28/2016 0642   VLDL 24 12/28/2016 0642   LDLCALC 65 07/12/2021 0955      Wt Readings from Last 3 Encounters:  07/27/21 252 lb 4 oz (114.4 kg)  07/18/21 247 lb (112 kg)  07/12/21 249 lb (112.9 kg)           No data to display  ASSESSMENT AND PLAN:  1.  Coronary artery disease involving native coronary arteries without angina: Continue aspirin indefinitely.  2.  Chronic systolic heart failure: Ejection fraction improved to 50-55%.  No evidence of volume overload.  I elected to discontinue furosemide.    We refilled her cardiac medications including carvedilol, losartan and spironolactone.  3.  Hyperlipidemia: I reviewed recent lipid profile which showed an LDL of 65..  I refilled atorvastatin.  4.  Essential hypertension: Blood pressure is well controlled on current medications.    Disposition:   FU with me in 12 months  Signed,  Lorine Bears, MD  07/27/2021 9:14 AM    [facility]

## 2021-07-27 NOTE — Patient Instructions (Signed)
Medication Instructions:  Your physician has recommended you make the following change in your medication:    STOP Lasix (furosemide)  *If you need a refill on your cardiac medications before your next appointment, please call your pharmacy*   Lab Work: None ordered If you have labs (blood work) drawn today and your tests are completely normal, you will receive your results only by: MyChart Message (if you have MyChart) OR A paper copy in the mail If you have any lab test that is abnormal or we need to change your treatment, we will call you to review the results.   Testing/Procedures: None ordered   Follow-Up: At [facility], you and your health needs are our priority.  As part of our continuing mission to provide you with exceptional heart care, we have created designated Provider Care Teams.  These Care Teams include your primary Cardiologist (physician) and Advanced Practice Providers (APPs -  Physician Assistants and Nurse Practitioners) who all work together to provide you with the care you need, when you need it.  We recommend signing up for the patient portal called "MyChart".  Sign up information is provided on this After Visit Summary.  MyChart is used to connect with patients for Virtual Visits (Telemedicine).  Patients are able to view lab/test results, encounter notes, upcoming appointments, etc.  Non-urgent messages can be sent to your provider as well.   To learn more about what you can do with MyChart, go to [email or web address].    Your next appointment:   Your physician wants you to follow-up in: 1 year You will receive a reminder letter in the mail two months in advance. If you don't receive a letter, please call our office to schedule the follow-up appointment.   The format for your next appointment:   In Person  Provider:   You may see Lorine Bears, MD or one of the following Advanced Practice Providers on your designated Care Team:   Nicolasa Ducking, NP Eula Listen, PA-C Cadence Fransico Michael, [place]   Other Instructions N/A  Important Information About Sugar

## 2021-08-08 ENCOUNTER — Other Ambulatory Visit: Payer: Self-pay

## 2021-08-15 ENCOUNTER — Other Ambulatory Visit: Payer: Self-pay

## 2021-09-13 ENCOUNTER — Other Ambulatory Visit: Payer: Self-pay

## 2021-09-14 LAB — T4, FREE: Free T4: 1.33 ng/dL (ref 0.82–1.77)

## 2021-09-14 LAB — TSH: TSH: 1.92 u[IU]/mL (ref 0.450–4.500)

## 2021-09-14 LAB — SPECIMEN STATUS REPORT

## 2021-09-18 ENCOUNTER — Encounter: Payer: Self-pay | Admitting: Nurse Practitioner

## 2021-09-18 ENCOUNTER — Telehealth (INDEPENDENT_AMBULATORY_CARE_PROVIDER_SITE_OTHER): Payer: Self-pay | Admitting: Nurse Practitioner

## 2021-09-18 DIAGNOSIS — E890 Postprocedural hypothyroidism: Secondary | ICD-10-CM

## 2021-09-18 NOTE — Patient Instructions (Signed)

## 2021-09-18 NOTE — Progress Notes (Signed)
Endocrinology Follow Up Note 09/18/21    TELEHEALTH VISIT: The patient is being engaged in telehealth visit.  This type of visit limits physical examination significantly, and thus is not preferable over face-to-face encounters.  I connected with  Lindsey Gibson on 09/18/21 by a video enabled telemedicine application and verified that I am speaking with the correct person using two identifiers.   I discussed the limitations of evaluation and management by telemedicine. The patient expressed understanding and agreed to proceed.    The participants involved in this visit include: Dani Gobble, NP located at [facility] and Lindsey Gibson  located at their personal residence listed.   ---------------------------------------------------------------------------------------------------------------------- Subjective    Past Medical History:  Diagnosis Date   Anemia    Anxiety    Arthritis    Asthma    CAD (coronary artery disease)    a. 12/2016 NSTEMI/PCI: LM nl, LAD 30ost, 42m (IVUS->severe plaque burden->3.0 x 18 Sierra DES), D1/2/3 nl, LCX 30p, OM1/2/3 nl, RCA nl, RPDA/RPAV/RPL1/2 nl.   COPD (chronic obstructive pulmonary disease) (HCC)    Hyperlipidemia    Hypertension    Ischemic cardiomyopathy    a. 12/2016 Echo: EF 20-25%, mid-apicalanteroseptal, ant, and apical AK, GR1 DD, triv AI, mildly dil LA; b. 04/2017 Echo: EF 50-55%, Gr2 DD, Nl RV fxn.   Morbid obesity (HCC)    Myocardial infarction (HCC) 2018    Past Surgical History:  Procedure Laterality Date   CORONARY STENT INTERVENTION N/A 12/27/2016   Procedure: CORONARY STENT INTERVENTION;  Surgeon: Iran Ouch, MD;  Location: [facility] INVASIVE CV [facility];  Service: Cardiovascular;  Laterality: N/A;   INTRAVASCULAR ULTRASOUND/IVUS N/A 12/27/2016   Procedure: Intravascular Ultrasound/IVUS;  Surgeon: Iran Ouch, MD;  Location: [facility] CV [facility];  Service: Cardiovascular;  Laterality: N/A;    KNEE ARTHROSCOPY Right    LEFT HEART CATH AND CORONARY ANGIOGRAPHY N/A 12/27/2016   Procedure: LEFT HEART CATH AND CORONARY ANGIOGRAPHY;  Surgeon: Iran Ouch, MD;  Location: [facility] CV [facility];  Service: Cardiovascular;  Laterality: N/A;   SHOULDER ARTHROSCOPY WITH SUBACROMIAL DECOMPRESSION Right 11/22/2015   Procedure: SHOULDER ARTHROSCOPY WITH DEBRIDEMENT, DECOMPRESSION  AND BICEPS TENODESIS;  Surgeon: Christena Flake, MD;  Location: [facility] OR[facility];  Service: Orthopedics;  Laterality: Right;   THYROIDECTOMY N/A 05/05/2021   Procedure: THYROIDECTOMY, TOTAL;  Surgeon: Franky Macho, MD;  Location: [facility];  Service: General;  Laterality: N/A;   TONSILLECTOMY     ULNAR NERVE TRANSPOSITION Right 11/22/2015   Procedure: ULNAR NERVE DECOMPRESSION/TRANSPOSITION;  Surgeon: Christena Flake, MD;  Location: [facility];  Service: Orthopedics;  Laterality: Right;    Social History   Socioeconomic History   Marital status: Married    Spouse name: Not on file   Number of children: Not on file   Years of education: Not on file   Highest education level: Not on file  Occupational History   Occupation: none  Tobacco Use   Smoking status: Former    Packs/day: 1.00    Years: 18.00    Total pack years: 18.00    Types: Cigarettes    Quit date: 39    Years since quitting: 25.7   Smokeless tobacco: Never  Vaping Use   Vaping Use: Never used  Substance and Sexual Activity   Alcohol use: No   Drug use: No   Sexual activity: Not on file  Other Topics Concern   Not on file  Social History Narrative   Rents with family.  Not on food stamps.   Social Determinants of Health   Financial Resource Strain: Low Risk  (05/29/2017)   Overall Financial Resource Strain (CARDIA)    Difficulty of Paying Living Expenses: Not hard at all  Food Insecurity: No Food Insecurity (06/21/2021)   Hunger Vital Sign    Worried About Running Out of Food in the Last Year: Never true    Ran Out of Food in the Last Year: Never  true  Transportation Needs: No Transportation Needs (06/21/2021)   PRAPARE - Hydrologist (Medical): No    Lack of Transportation (Non-Medical): No  Physical Activity: Sufficiently Active (05/29/2017)   Exercise Vital Sign    Days of Exercise per Week: 3 days    Minutes of Exercise per Session: 60 min  Stress: No Stress Concern Present (05/29/2017)   [place]    Feeling of Stress : Not at all  Social Connections: Moderately Integrated (05/29/2017)   Social Connection and Isolation Panel [NHANES]    Frequency of Communication with Friends and Family: More than three times a week    Frequency of Social Gatherings with Friends and Family: More than three times a week    Attends Religious Services: More than 4 times per year    Active Member of [employer] or Organizations: No    Attends Archivist Meetings: Never    Marital Status: Married  Human resources officer Violence: Not At Risk (05/29/2017)   Humiliation, Afraid, Rape, and Kick questionnaire    Fear of Current or Ex-Partner: No    Emotionally Abused: No    Physically Abused: No    Sexually Abused: No    Current Outpatient Medications on File Prior to Visit  Medication Sig Dispense Refill   albuterol (PROVENTIL HFA) 108 (90 Base) MCG/ACT inhaler INHALE 1 PUFF INTO THE LUNGS ONCE EVERY 6 HOURS AS NEEDED FOR WHEEZING OR SHORTNESS OF BREATH. 20.1 g 1   atorvastatin (LIPITOR) 80 MG tablet TAKE ONE TABLET BY MOUTH EVERY DAY 90 tablet 3   carvedilol (COREG) 3.125 MG tablet Take 1 tablet by mouth 2 (two) times daily with a meal. 180 tablet 3   cetirizine (ZYRTEC) 10 MG tablet TAKE ONE TABLET BY MOUTH ONCE EVERY DAY. 90 tablet 1   ibuprofen (ADVIL) 200 MG tablet Take 2 tablets (400 mg total) by mouth every 6 (six) hours as needed for mild pain. 30 tablet 0   levothyroxine (SYNTHROID) 100 MCG tablet Take 1 tablet (100 mcg total) by mouth daily before  breakfast. 90 tablet 1   losartan (COZAAR) 50 MG tablet TAKE ONE TABLET BY MOUTH ONCE EVERY DAY 90 tablet 3   Multiple Vitamin (MULTIVITAMIN WITH MINERALS) TABS tablet Take 1 tablet by mouth daily.     spironolactone (ALDACTONE) 25 MG tablet Take 1 tablet (25 mg total) by mouth daily. 90 tablet 3   No current facility-administered medications on file prior to visit.      HPI   Lindsey Gibson is a 61 y.o.-year-old female, referred by her PCP, Dr.Chaplin, for evaluation for multinodular goiter and suppressed TSH (suggestive of hyperthyroidism).  She had total thyroidectomy on 05/05/21 and she was subsequently started on thyroid hormone at 100 mcg po daily before breakfast.  Thyroid U/S: shows enlarged multinodular thyroid with 4+ moderately suspicious nodules recommending biopsy.  She also had uptake and scan done which was WNL.   I reviewed pt's thyroid tests: Lab Results  Component Value Date   TSH 1.920 09/13/2021   TSH 2.030 07/12/2021   TSH 6.130 (H) 06/14/2021   TSH 0.130 (L) 05/10/2021   TSH 0.377 05/03/2021   TSH 0.290 (L) 02/22/2021   TSH 0.280 (L) 01/04/2021   TSH 0.297 (L) 09/28/2020   TSH 0.292 (L) 06/29/2020   TSH 0.731 08/05/2019   FREET4 1.33 09/13/2021   FREET4 1.03 06/14/2021   FREET4 1.78 (H) 05/10/2021    No FH of thyroid ds. No FH of thyroid cancer. No h/o radiation tx to head or neck.  No seaweed or kelp. No recent contrast studies. No steroid use. No herbal supplements. No Biotin supplements or Hair, Skin and Nails vitamins.  Pt also has a history of HTN, COPD, MI.  Review of systems  Constitutional: + Minimally fluctuating body weight,  current There is no height or weight on file to calculate BMI. , no fatigue, no subjective hyperthermia, no subjective hypothermia Eyes: no blurry vision, no xerophthalmia ENT: no sore throat, no nodules palpated in throat, no dysphagia/odynophagia, no hoarseness Cardiovascular: no chest pain, no shortness of breath,  no palpitations, no leg swelling Respiratory: no cough, no shortness of breath Gastrointestinal: no nausea/vomiting/diarrhea Musculoskeletal: no muscle/joint aches Skin: no rashes, no hyperemia Neurological: no tremors, no numbness, no tingling, no dizziness Psychiatric: no depression, no anxiety  ---------------------------------------------------------------------------------------------------------------------- Objective    There were no vitals taken for this visit.   BP Readings from Last 3 Encounters:  07/27/21 110/70  07/18/21 126/72  07/12/21 139/79    Wt Readings from Last 3 Encounters:  07/27/21 252 lb 4 oz (114.4 kg)  07/18/21 247 lb (112 kg)  07/12/21 249 lb (112.9 kg)    Physical Exam- Telehealth- significantly limited due to nature of visit  Constitutional: There is no height or weight on file to calculate BMI. , not in acute distress, normal state of mind Respiratory: Adequate breathing efforts    Latest Reference Range & Units 05/03/21 10:38 05/10/21 10:48 06/14/21 12:34 07/12/21 09:55 09/13/21 00:00  TSH 0.450 - 4.500 uIU/mL 0.377 0.130 (L) 6.130 (H) 2.030 1.920  Triiodothyronine,Free,Serum 2.0 - 4.4 pg/mL  3.1     T4,Free(Direct) 0.82 - 1.77 ng/dL  1.61 (H) 0.96  0.45  Thyroxine (T4) 4.5 - 12.0 ug/dL   7.3    (L): Data is abnormally low (H): Data is abnormally high   ------------------------------------------------------------------------------------------------------------------------ Uptake and Scan from 02/17/21 CLINICAL DATA:  Suppressed TSH, weight loss, increased nervousness   EXAM: THYROID SCAN AND UPTAKE - 4 AND 24 HOURS   TECHNIQUE: Following oral administration of I-123 capsule, anterior planar imaging was acquired at 24 hours. Thyroid uptake was calculated with a thyroid probe at 4-6 hours and 24 hours.   RADIOPHARMACEUTICALS:  293.2 uCi I-123 sodium iodide p.o.   COMPARISON:  None   FINDINGS: Thyroid lobes appear enlarged  bilaterally.   Both lobes demonstrate large cold masses involving the mid to inferior lobes.   4 hour I-123 uptake = 5.5% (normal 5-20%)   24 hour I-123 uptake = 18.4% (normal 10-30%)   IMPRESSION: Normal 4 hour and 24 hour radio iodine uptakes.   Large cold masses within the mid to inferior thyroid lobes bilaterally; thyroid ultrasound assessment recommended to exclude neoplasm.     Electronically Signed   By: Ulyses Southward M.D.   On: 02/17/2021 10:35  ---------------------------------------------------------------------------------------------------------------------------------- Thyroid US from 03/07/21 CLINICAL DATA:  Prior ultrasound follow-up.   EXAM: THYROID ULTRASOUND   TECHNIQUE: Ultrasound examination of the thyroid gland and  adjacent soft tissues was performed.   COMPARISON:  Nuclear medicine thyroid scan February 2023   FINDINGS: Parenchymal Echotexture: Thyroid tissue largely replaced by numerous masses   Isthmus: 0.5 cm   Right lobe: 8.0 x 3.7 x 4.1 cm   Left lobe: 7.5 x 3.6 x 3.8 cm   _________________________________________________________   Estimated total number of nodules >/= 1 cm: >10   Number of spongiform nodules >/=  2 cm not described below (TR1): 0   Number of mixed cystic and solid nodules >/= 1.5 cm not described below (TR2): 0   _________________________________________________________   Nodule labeled 1 is a solid isoechoic TR 3 nodule in the right thyroid lobe measuring 2.3 x 2.2 x 1.6 cm. *Given size (>/= 1.5 - 2.4 cm) and appearance, a follow-up ultrasound in 1 year should be considered based on TI-RADS criteria.   Nodule labeled 2 is a solid isoechoic TR 3 nodule in the right thyroid lobe measuring 2.1 x 1.9 x 1.7 cm. *Given size (>/= 1.5 - 2.4 cm) and appearance, a follow-up ultrasound in 1 year should be considered based on TI-RADS criteria.   Nodule labeled 3 is a solid isoechoic TR 3 nodule in the right thyroid lobe  measuring 2.2 x 2.1 x 1.7 cm. *Given size (>/= 1.5 - 2.4 cm) and appearance, a follow-up ultrasound in 1 year should be considered based on TI-RADS criteria.   Nodule labeled 4 is a solid hypoechoic TR 4 nodule in the right thyroid lobe measuring 2.1 x 2.0 x 1.8 cm. **Given size (>/= 1.5 cm) and appearance, fine needle aspiration of this moderately suspicious nodule should be considered based on TI-RADS criteria.   Nodule labeled 5 is a solid hypoechoic TR 4 nodule in the right thyroid lobe measuring 2.3 x 2.0 x 1.8 cm. **Given size (>/= 1.5 cm) and appearance, fine needle aspiration of this moderately suspicious nodule should be considered based on TI-RADS criteria.   Nodule labeled 6 is a solid isoechoic TR 3 nodule in the right thyroid lobe measuring 1.4 x 1.2 x 1.0 cm. Given size (<1.4 cm) and appearance, this nodule does NOT meet TI-RADS criteria for biopsy or dedicated follow-up.   Nodule labeled 7 is a solid isoechoic TR 3 nodule in the left thyroid lobe measuring 2.9 x 2.2 x 2.0 cm. **Given size (>/= 2.5 cm) and appearance, fine needle aspiration of this mildly suspicious nodule should be considered based on TI-RADS criteria.   Nodule labeled 8 is a solid isoechoic TR 3 nodule in the left thyroid lobe measuring 2.5 x 2.0 x 1.6 cm. **Given size (>/= 2.5 cm) and appearance, fine needle aspiration of this mildly suspicious nodule should be considered based on TI-RADS criteria.   Nodule labeled 9 is a solid isoechoic TR 3 nodule in the left thyroid lobe measuring 1.9 x 1.8 x 1.8 cm. *Given size (>/= 1.5 - 2.4 cm) and appearance, a follow-up ultrasound in 1 year should be considered based on TI-RADS criteria.   Nodule labeled 10 is a small mixed cystic and solid nodule in the left thyroid lobe measuring 1.1 cm. This nodule does NOT meet TI-RADS criteria for biopsy or dedicated follow-up.   IMPRESSION: 1. Enlarged multinodular thyroid gland with numerous nodules essentially  replacing the normal thyroid parenchyma. 2. Nodules labeled 4, 5, 7 and 8 meet criteria for biopsy. Per ACR TI-RADS criteria, the 2 most suspicious nodules should be biopsied, which are the nodules labeled 4 and 5 (both of these are TR  4 nodules in the right thyroid lobe). 3. Nodules labeled 1, 2, 3 and 9 meet criteria for follow-up ultrasound in 1 year.   The above is in keeping with the ACR TI-RADS recommendations - J Am Coll Radiol 2017;14:587-595.     Electronically Signed   By: Olive Bass M.D.   On: 03/08/2021 11:11 ----------------------------------------------------------------------------------------------------------------------  ASSESSMENT / PLAN:  1. Multinodular goiter-s/p total thyroidectomy 2. Postsurgical hypothyroidism  She is s/p total thyroidectomy on 05/05/21 by Dr. Franky Macho.    Her previsit thyroid function tests are consistent with appropriate hormone replacement.  She is advised to continue her Levothyroxine 100 mcg po daily before breakfast.   - The correct intake of thyroid hormone (Levothyroxine, Synthroid), is on empty stomach first thing in the morning, with water, separated by at least 30 minutes from breakfast and other medications,  and separated by more than 4 hours from calcium, iron, multivitamins, acid reflux medications (PPIs).  - This medication is a life-long medication and will be needed to correct thyroid hormone imbalances for the rest of your life.  The dose may change from time to time, based on thyroid blood work.  - It is extremely important to be consistent taking this medication, near the same time each morning.  -AVOID TAKING PRODUCTS CONTAINING BIOTIN (commonly found in Hair, Skin, Nails vitamins) AS IT INTERFERES WITH THE VALIDITY OF THYROID FUNCTION BLOOD TESTS.    Follow Up Plan:  Return in about 4 months (around 01/18/2022) for Thyroid follow up, Previsit labs, Virtual visit ok.      I spent 20 minutes dedicated to  the care of this patient on the date of this encounter to include pre-visit review of records, face-to-face time with the patient, and post visit ordering of testing.  Lindsey Gibson  participated in the discussions, expressed understanding, and voiced agreement with the above plans.  All questions were answered to her satisfaction. she is encouraged to contact clinic should she have any questions or concerns prior to her return visit.   Ronny Bacon, [facility] [address] [place], [place] [address] Phone: [phone] Fax: [phone]

## 2021-09-27 ENCOUNTER — Other Ambulatory Visit: Payer: Self-pay

## 2021-11-13 ENCOUNTER — Other Ambulatory Visit: Payer: Self-pay | Admitting: Gerontology

## 2021-11-13 ENCOUNTER — Other Ambulatory Visit: Payer: Self-pay

## 2021-11-14 ENCOUNTER — Other Ambulatory Visit: Payer: Self-pay

## 2021-11-14 ENCOUNTER — Other Ambulatory Visit: Payer: Self-pay | Admitting: Gerontology

## 2021-11-14 MED FILL — Cetirizine HCl Tab 10 MG: ORAL | 90 days supply | Qty: 90 | Fill #0 | Status: AC

## 2021-11-16 ENCOUNTER — Other Ambulatory Visit: Payer: Self-pay

## 2021-11-29 ENCOUNTER — Other Ambulatory Visit: Payer: Self-pay

## 2021-11-29 VITALS — BP 152/75 | HR 64 | Temp 97.6°F | Ht 71.0 in | Wt 257.2 lb

## 2021-11-29 DIAGNOSIS — N390 Urinary tract infection, site not specified: Secondary | ICD-10-CM

## 2021-11-30 LAB — CBC WITH DIFFERENTIAL/PLATELET
Basophils Absolute: 0 10*3/uL (ref 0.0–0.2)
Basos: 1 %
EOS (ABSOLUTE): 0.2 10*3/uL (ref 0.0–0.4)
Eos: 3 %
Hematocrit: 37.5 % (ref 34.0–46.6)
Hemoglobin: 11.9 g/dL (ref 11.1–15.9)
Immature Grans (Abs): 0 10*3/uL (ref 0.0–0.1)
Immature Granulocytes: 0 %
Lymphocytes Absolute: 2.5 10*3/uL (ref 0.7–3.1)
Lymphs: 40 %
MCH: 26.8 pg (ref 26.6–33.0)
MCHC: 31.7 g/dL (ref 31.5–35.7)
MCV: 85 fL (ref 79–97)
Monocytes Absolute: 0.4 10*3/uL (ref 0.1–0.9)
Monocytes: 7 %
Neutrophils Absolute: 3.1 10*3/uL (ref 1.4–7.0)
Neutrophils: 49 %
Platelets: 248 10*3/uL (ref 150–450)
RBC: 4.44 x10E6/uL (ref 3.77–5.28)
RDW: 14.7 % (ref 11.7–15.4)
WBC: 6.2 10*3/uL (ref 3.4–10.8)

## 2021-11-30 LAB — MICROSCOPIC EXAMINATION
Casts: NONE SEEN /lpf
Epithelial Cells (non renal): NONE SEEN /hpf (ref 0–10)
RBC, Urine: >30 /hpf — AB (ref 0–2)
WBC, UA: >30 /hpf — AB (ref 0–5)

## 2021-11-30 LAB — COMPREHENSIVE METABOLIC PANEL
ALT: 18 IU/L (ref 0–32)
AST: 18 IU/L (ref 0–40)
Albumin/Globulin Ratio: 1.9 (ref 1.2–2.2)
Albumin: 4.5 g/dL (ref 3.9–4.9)
Alkaline Phosphatase: 86 IU/L (ref 44–121)
BUN/Creatinine Ratio: 15 (ref 12–28)
BUN: 12 mg/dL (ref 8–27)
Bilirubin Total: 0.9 mg/dL (ref 0.0–1.2)
CO2: 24 mmol/L (ref 20–29)
Calcium: 9.3 mg/dL (ref 8.7–10.3)
Chloride: 105 mmol/L (ref 96–106)
Creatinine, Ser: 0.81 mg/dL (ref 0.57–1.00)
Globulin, Total: 2.4 g/dL (ref 1.5–4.5)
Glucose: 99 mg/dL (ref 70–99)
Potassium: 4.4 mmol/L (ref 3.5–5.2)
Sodium: 144 mmol/L (ref 134–144)
Total Protein: 6.9 g/dL (ref 6.0–8.5)
eGFR: 82 mL/min/{1.73_m2} (ref >59)

## 2021-11-30 LAB — URINALYSIS, ROUTINE W REFLEX MICROSCOPIC
Bilirubin, UA: NEGATIVE
Glucose, UA: NEGATIVE
Ketones, UA: NEGATIVE
Nitrite, UA: NEGATIVE
Specific Gravity, UA: 1.015 (ref 1.005–1.030)
Urobilinogen, Ur: 0.2 mg/dL (ref 0.2–1.0)
pH, UA: 6 (ref 5.0–7.5)

## 2021-11-30 LAB — LIPID PANEL
Chol/HDL Ratio: 3.2 ratio (ref 0.0–4.4)
Cholesterol, Total: 139 mg/dL (ref 100–199)
HDL: 44 mg/dL (ref >39)
LDL Chol Calc (NIH): 64 mg/dL (ref 0–99)
Triglycerides: 185 mg/dL — ABNORMAL HIGH (ref 0–149)
VLDL Cholesterol Cal: 31 mg/dL (ref 5–40)

## 2021-12-07 ENCOUNTER — Ambulatory Visit: Payer: Self-pay | Admitting: Gerontology

## 2021-12-07 ENCOUNTER — Encounter: Payer: Self-pay | Admitting: Gerontology

## 2021-12-07 ENCOUNTER — Other Ambulatory Visit: Payer: Self-pay

## 2021-12-07 VITALS — BP 118/78 | HR 79 | Wt 259.0 lb

## 2021-12-07 DIAGNOSIS — E890 Postprocedural hypothyroidism: Secondary | ICD-10-CM

## 2021-12-07 DIAGNOSIS — J449 Chronic obstructive pulmonary disease, unspecified: Secondary | ICD-10-CM

## 2021-12-07 DIAGNOSIS — N390 Urinary tract infection, site not specified: Secondary | ICD-10-CM

## 2021-12-07 MED ORDER — ALBUTEROL SULFATE HFA 108 (90 BASE) MCG/ACT IN AERS
INHALATION_SPRAY | RESPIRATORY_TRACT | 1 refills | Status: DC
  Filled 2022-01-19: qty 6.7, 25d supply, fill #0
  Filled 2022-03-08 (×2): qty 6.7, 25d supply, fill #1

## 2021-12-07 MED ORDER — LEVOTHYROXINE SODIUM 100 MCG PO TABS
100.0000 ug | ORAL_TABLET | Freq: Every day | ORAL | 1 refills | Status: DC
  Filled 2021-12-07: qty 90, 90d supply, fill #0

## 2021-12-07 MED ORDER — NITROFURANTOIN MONOHYD MACRO 100 MG PO CAPS
100.0000 mg | ORAL_CAPSULE | Freq: Two times a day (BID) | ORAL | 0 refills | Status: DC
  Filled 2021-12-07: qty 10, 5d supply, fill #0

## 2021-12-07 NOTE — Patient Instructions (Signed)
Urinary Tract Infection, Adult A urinary tract infection (UTI) is an infection of any part of the urinary tract. The urinary tract includes: The kidneys. The ureters. The bladder. The urethra. These organs make, store, and get rid of pee (urine) in the body. What are the causes? This infection is caused by germs (bacteria) in your genital area. These germs grow and cause swelling (inflammation) of your urinary tract. What increases the risk? The following factors may make you more likely to develop this condition: Using a small, thin tube (catheter) to drain pee. Not being able to control when you pee or poop (incontinence). Being female. If you are female, these things can increase the risk: Using these methods to prevent pregnancy: A medicine that kills sperm (spermicide). A device that blocks sperm (diaphragm). Having low levels of a female hormone (estrogen). Being pregnant. You are more likely to develop this condition if: You have genes that add to your risk. You are sexually active. You take antibiotic medicines. You have trouble peeing because of: A prostate that is bigger than normal, if you are female. A blockage in the part of your body that drains pee from the bladder. A kidney stone. A nerve condition that affects your bladder. Not getting enough to drink. Not peeing often enough. You have other conditions, such as: Diabetes. A weak disease-fighting system (immune system). Sickle cell disease. Gout. Injury of the spine. What are the signs or symptoms? Symptoms of this condition include: Needing to pee right away. Peeing small amounts often. Pain or burning when peeing. Blood in the pee. Pee that smells bad or not like normal. Trouble peeing. Pee that is cloudy. Fluid coming from the vagina, if you are female. Pain in the belly or lower back. Other symptoms include: Vomiting. Not feeling hungry. Feeling mixed up (confused). This may be the first symptom in  older adults. Being tired and grouchy (irritable). A fever. Watery poop (diarrhea). How is this treated? Taking antibiotic medicine. Taking other medicines. Drinking enough water. In some cases, you may need to see a specialist. Follow these instructions at home:  Medicines Take over-the-counter and prescription medicines only as told by your doctor. If you were prescribed an antibiotic medicine, take it as told by your doctor. Do not stop taking it even if you start to feel better. General instructions Make sure you: Pee until your bladder is empty. Do not hold pee for a long time. Empty your bladder after sex. Wipe from front to back after peeing or pooping if you are a female. Use each tissue one time when you wipe. Drink enough fluid to keep your pee pale yellow. Keep all follow-up visits. Contact a doctor if: You do not get better after 1-2 days. Your symptoms go away and then come back. Get help right away if: You have very bad back pain. You have very bad pain in your lower belly. You have a fever. You have chills. You feeling like you will vomit or you vomit. Summary A urinary tract infection (UTI) is an infection of any part of the urinary tract. This condition is caused by germs in your genital area. There are many risk factors for a UTI. Treatment includes antibiotic medicines. Drink enough fluid to keep your pee pale yellow. This information is not intended to replace advice given to you by your health care provider. Make sure you discuss any questions you have with your health care provider. Document Revised: 07/31/2019 Document Reviewed: 07/31/2019 Elsevier Patient Education    2023 [employer]  

## 2021-12-07 NOTE — Progress Notes (Signed)
Established Patient Office Visit  Subjective   Patient ID: Lindsey Gibson, female    DOB: 1960/12/02  Age: 61 y.o. MRN: [id number]  Chief Complaint  Patient presents with   Follow-up    HPI  Lindsey Gibson is a 61 y/o female who has history of total thyroidectomy, anemia, anxiety, arthritis, asthma, CAD, COPD, hyperlipidemia, hypertension, ischemic cardiomyopathy, morbid obesity, myocardial infarction, who presents for follow-up visit , medication refill and lab review.  Urinalysis and culture done on 11/29/21, was cloudy, 3+ Leukocytes, 2+ RBC, with many bacteria. She admits to experiencing intermittent dysuria, urgency, frequency, ut denies fever, chills and flank nor pelvic pain. Her other labs were unremarkable. She states that she is compliant with her medications, and continues to make healthy lifestyle changes.    She was seen by Cardiologist Dr Rod Can.A on 0/16/55,VZSMOLM systolic heart failure: Ejection fraction improved to 50-55%.  No evidence of volume overload.  I elected to discontinue furosemide.  We refilled her cardiac medications including carvedilol, losartan and spironolactone. Overall, she states that she's doing well and offers no further complaint.     Patient Active Problem List   Diagnosis Date Noted   Urinary tract infection 12/07/2021   Prediabetes 06/21/2021   Health care maintenance 06/21/2021   S/P total thyroidectomy 05/05/2021   Thyroid goiter 03/22/2021   Low TSH level 01/11/2021   Hypertension 01/11/2021   Abnormal urinalysis 11/11/2019   COPD (chronic obstructive pulmonary disease) (HCC)    Asthma    Morbid obesity ([place])    Pneumonia 12/27/2016   NSTEMI (non-ST elevated myocardial infarction) [facility])    Past Medical History:  Diagnosis Date   Anemia    Anxiety    Arthritis    Asthma    CAD (coronary artery disease)    a. 12/2016 NSTEMI/PCI: LM nl, LAD 30ost, 26m(IVUS->severe plaque burden->3.0 x 18 [place] DES), D1/2/3 nl, LCX 30p, OM1/2/3  nl, RCA nl, RPDA/RPAV/RPL1/2 nl.   COPD (chronic obstructive pulmonary disease) ([place]    Hyperlipidemia    Hypertension    Ischemic cardiomyopathy    a. 12/2016 Echo: EF 20-25%, mid-apicalanteroseptal, ant, and apical AK, GR1 DD, triv AI, mildly dil LA; b. 04/2017 Echo: EF 50-55%, Gr2 DD, Nl RV fxn.   [place]    [place]   Past Surgical History:  Procedure Laterality Date   CORONARY STENT INTERVENTION N/A 12/27/2016   Procedure: CORONARY STENT INTERVENTION;  Surgeon: AWellington Hampshire MD;  [facility];  Service: Cardiovascular;  Laterality: N/A;   INTRAVASCULAR ULTRASOUND/IVUS N/A 12/27/2016   Procedure: Intravascular Ultrasound/IVUS;  Surgeon: AWellington Hampshire MD;  [facility];  Service: Cardiovascular;  Laterality: N/A;   KNEE ARTHROSCOPY Right    LEFT HEART CATH AND CORONARY ANGIOGRAPHY N/A 12/27/2016   Procedure: LEFT HEART CATH AND CORONARY ANGIOGRAPHY;  Surgeon: AWellington Hampshire MD;  [facility] [facility];  Service: Cardiovascular;  Laterality: N/A;   SHOULDER ARTHROSCOPY WITH SUBACROMIAL DECOMPRESSION Right 11/22/2015   Procedure: SHOULDER ARTHROSCOPY WITH DEBRIDEMENT, DECOMPRESSION  AND BICEPS TENODESIS;  Surgeon: JCorky Mull MD;  Location: [facility];  Service: Orthopedics;  Laterality: Right;   THYROIDECTOMY N/A 05/05/2021   Procedure: THYROIDECTOMY, TOTAL;  Surgeon: JAviva Signs MD;  Location: [facility];  Service: General;  Laterality: N/A;   TONSILLECTOMY     ULNAR NERVE TRANSPOSITION Right 11/22/2015   Procedure: ULNAR NERVE DECOMPRESSION/TRANSPOSITION;  Surgeon: JCorky Mull MD;  Location: [facility];  Service: Orthopedics;  Laterality: Right;   Social History   Tobacco Use   Smoking status: Former    Packs/day: 1.00    Years: 18.00    Total pack years: 18.00    Types: Cigarettes    Quit date: 1998    Years since quitting: 25.9   Smokeless tobacco: Never  Vaping Use   Vaping Use:  Never used  Substance Use Topics   Alcohol use: No   Drug use: No   Allergies  Allergen Reactions   Penicillins Anaphylaxis    Has patient had a PCN reaction causing immediate rash, facial/tongue/throat swelling, SOB If all of the above answers are "NO", then may proceed with Cephalosporin use.   or lightheadedness with hypotension:unsure Has patient had a PCN reaction causing severe rash involving mucus membranes or skin necrosis:unsure Has patient had a PCN reaction that required hospitalization:unsure Has patient had a PCN reaction occurring within the last 10 years:unsure  Childhood reaction      Review of Systems  Constitutional: Negative.   Respiratory: Negative.    Cardiovascular: Negative.   Gastrointestinal: Negative.   Genitourinary:  Positive for hematuria and urgency. Negative for flank pain.  Skin: Negative.   Neurological: Negative.   Psychiatric/Behavioral: Negative.        Objective:     BP 118/78   Pulse 79   Wt 259 lb (117.5 kg)   SpO2 97%   BMI 36.12 kg/m  BP Readings from Last 3 Encounters:  12/07/21 118/78  11/29/21 (!) 152/75  07/27/21 110/70   Wt Readings from Last 3 Encounters:  12/07/21 259 lb (117.5 kg)  11/29/21 257 lb 3.2 oz (116.7 kg)  07/27/21 252 lb 4 oz (114.4 kg)      Physical Exam HENT:     Head: Normocephalic and atraumatic.     Mouth/Throat:     Mouth: Mucous membranes are moist.  Eyes:     Pupils: Pupils are equal, round, and reactive to light.  Cardiovascular:     Rate and Rhythm: Normal rate and regular rhythm.     Pulses: Normal pulses.     Heart sounds: Normal heart sounds.  Pulmonary:     Effort: Pulmonary effort is normal.     Breath sounds: Normal breath sounds.  Abdominal:     General: Bowel sounds are normal.     Palpations: Abdomen is soft.     Tenderness: There is no right CVA tenderness or left CVA tenderness.  Skin:    General: Skin is warm.  Neurological:     General: No focal deficit present.      Mental Status: She is alert and oriented to person, place, and time. Mental status is at baseline.  Psychiatric:        Mood and Affect: Mood normal.        Behavior: Behavior normal.        Thought Content: Thought content normal.        Judgment: Judgment normal.      No results found for any visits on 12/07/21.  Last CBC Lab Results  Component Value Date   WBC 6.2 11/29/2021   HGB 11.9 11/29/2021   HCT 37.5 11/29/2021   MCV 85 11/29/2021   MCH 26.8 11/29/2021   RDW 14.7 11/29/2021   PLT 248 09/81/1914   Last metabolic panel Lab Results  Component Value Date   GLUCOSE 99 11/29/2021   NA 144 11/29/2021   K 4.4 11/29/2021   CL 105 11/29/2021  CO2 24 11/29/2021   BUN 12 11/29/2021   CREATININE 0.81 11/29/2021   EGFR 82 11/29/2021   CALCIUM 9.3 11/29/2021   PHOS 3.5 05/06/2021   PROT 6.9 11/29/2021   ALBUMIN 4.5 11/29/2021   LABGLOB 2.4 11/29/2021   AGRATIO 1.9 11/29/2021   BILITOT 0.9 11/29/2021   ALKPHOS 86 11/29/2021   AST 18 11/29/2021   ALT 18 11/29/2021   ANIONGAP 6 05/06/2021   Last lipids Lab Results  Component Value Date   CHOL 139 11/29/2021   HDL 44 11/29/2021   LDLCALC 64 11/29/2021   TRIG 185 (H) 11/29/2021   CHOLHDL 3.2 11/29/2021   Last hemoglobin A1c Lab Results  Component Value Date   HGBA1C 5.8 (H) 06/14/2021   Last thyroid functions Lab Results  Component Value Date   TSH 1.920 09/13/2021   T3TOTAL 132 02/22/2021   T4TOTAL 7.3 06/14/2021      The ASCVD Risk score (Arnett DK, et al., 2019) failed to calculate for the following reasons:   The patient has a prior MI or stroke diagnosis    Assessment & Plan:   1. Postoperative hypothyroidism - She will continue current medication, TSH was euthyroid. - levothyroxine (SYNTHROID) 100 MCG tablet; Take 1 tablet (100 mcg total) by mouth daily before breakfast.  Dispense: 90 tablet; Refill: 1  2. Urinary tract infection with hematuria, site unspecified - She was started on  Macrobid for UTI, educated on medication side effects and advised to notify clinic. She was encouraged to increase water intake and go to the ED for worsening symptoms. Will recheck urine in 5 weeks. - nitrofurantoin, macrocrystal-monohydrate, (MACROBID) 100 MG capsule; Take 1 capsule (100 mg total) by mouth 2 (two) times daily.  Dispense: 10 capsule; Refill: 0 - UA/M w/rflx Culture, Routine; Future  3. Chronic obstructive pulmonary disease, unspecified COPD type ([place]) - Her breathing is stable and will continue on current medication as needed. - albuterol (PROVENTIL HFA) 108 (90 Base) MCG/ACT inhaler; INHALE 1 PUFF INTO THE LUNGS ONCE EVERY 6 HOURS AS NEEDED FOR WHEEZING OR SHORTNESS OF BREATH.  Dispense: 20.1 g; Refill: 1   Return in about 7 weeks (around 01/24/2022), or if symptoms worsen or fail to improve.    Lyndzee Kliebert Jerold Coombe, NP

## 2021-12-11 ENCOUNTER — Other Ambulatory Visit: Payer: Self-pay

## 2021-12-12 ENCOUNTER — Other Ambulatory Visit: Payer: Self-pay | Admitting: Gerontology

## 2021-12-12 ENCOUNTER — Other Ambulatory Visit: Payer: Self-pay

## 2021-12-12 NOTE — Progress Notes (Signed)
Called patient, states that she finished the course of Macrobid yesterday for UTI, but started having constant dull 6/10 flank pain and urine was dark yellow. She denies fever, chills, nausea nor vomiting. She was advised to increase water intake, and go to the Emergency room. She declines going to the ED states that her car broke down. She states that she will call EMS if symptom worsens. She was advised to call clinic tomorrow.

## 2022-01-16 NOTE — Patient Instructions (Signed)

## 2022-01-17 ENCOUNTER — Other Ambulatory Visit: Payer: Self-pay

## 2022-01-17 VITALS — BP 149/69 | HR 71 | Temp 97.9°F | Ht 71.0 in | Wt 260.4 lb

## 2022-01-17 DIAGNOSIS — N390 Urinary tract infection, site not specified: Secondary | ICD-10-CM

## 2022-01-17 DIAGNOSIS — E890 Postprocedural hypothyroidism: Secondary | ICD-10-CM

## 2022-01-18 ENCOUNTER — Other Ambulatory Visit: Payer: Self-pay

## 2022-01-18 ENCOUNTER — Encounter: Payer: Self-pay | Admitting: Nurse Practitioner

## 2022-01-18 ENCOUNTER — Ambulatory Visit: Payer: Self-pay | Admitting: Nurse Practitioner

## 2022-01-18 VITALS — BP 125/76 | HR 81 | Ht 71.0 in | Wt 258.6 lb

## 2022-01-18 DIAGNOSIS — E890 Postprocedural hypothyroidism: Secondary | ICD-10-CM

## 2022-01-18 MED ORDER — LEVOTHYROXINE SODIUM 100 MCG PO TABS
100.0000 ug | ORAL_TABLET | Freq: Every day | ORAL | 3 refills | Status: DC
  Filled 2022-01-18 – 2022-03-08 (×3): qty 90, 90d supply, fill #0
  Filled 2022-05-21 (×2): qty 90, 90d supply, fill #1

## 2022-01-18 NOTE — Progress Notes (Signed)
Endocrinology Follow Up Note 01/18/22     ---------------------------------------------------------------------------------------------------------------------- Subjective    Past Medical History:  Diagnosis Date   Anemia    Anxiety    Arthritis    Asthma    CAD (coronary artery disease)    a. 12/2016 NSTEMI/PCI: LM nl, LAD 30ost, 56m (IVUS->severe plaque burden->3.0 x 18 [place] DES), D1/2/3 nl, LCX 30p, OM1/2/3 nl, RCA nl, RPDA/RPAV/RPL1/2 nl.   COPD (chronic obstructive pulmonary disease) (HCC)    Hyperlipidemia    Hypertension    Ischemic cardiomyopathy    a. 12/2016 Echo: EF 20-25%, mid-apicalanteroseptal, ant, and apical AK, GR1 DD, triv AI, mildly dil LA; b. 04/2017 Echo: EF 50-55%, Gr2 DD, Nl RV fxn.   Morbid obesity (HCC)    Myocardial infarction (HCC) 2018    Past Surgical History:  Procedure Laterality Date   CORONARY STENT INTERVENTION N/A 12/27/2016   Procedure: CORONARY STENT INTERVENTION;  Surgeon: Iran Ouch, MD;  Location: [facility];  Service: Cardiovascular;  Laterality: N/A;   INTRAVASCULAR ULTRASOUND/IVUS N/A 12/27/2016   Procedure: Intravascular Ultrasound/IVUS;  Surgeon: Iran Ouch, MD;  Location: [facility];  Service: Cardiovascular;  Laterality: N/A;   KNEE ARTHROSCOPY Right    LEFT HEART CATH AND CORONARY ANGIOGRAPHY N/A 12/27/2016   Procedure: LEFT HEART CATH AND CORONARY ANGIOGRAPHY;  Surgeon: Iran Ouch, MD;  Location: [facility];  Service: Cardiovascular;  Laterality: N/A;   SHOULDER ARTHROSCOPY WITH SUBACROMIAL DECOMPRESSION Right 11/22/2015   Procedure: SHOULDER ARTHROSCOPY WITH DEBRIDEMENT, DECOMPRESSION  AND BICEPS TENODESIS;  Surgeon: Christena Flake, MD;  Location: [facility];  Service: Orthopedics;  Laterality: Right;   THYROIDECTOMY N/A 05/05/2021   Procedure: THYROIDECTOMY, TOTAL;  Surgeon: Franky Macho, MD;  Location: [facility];  Service: General;  Laterality: N/A;   TONSILLECTOMY     ULNAR NERVE  TRANSPOSITION Right 11/22/2015   Procedure: ULNAR NERVE DECOMPRESSION/TRANSPOSITION;  Surgeon: Christena Flake, MD;  Location: [facility];  Service: Orthopedics;  Laterality: Right;    Social History   Socioeconomic History   Marital status: Married    Spouse name: Not on file   Number of children: Not on file   Years of education: Not on file   Highest education level: Not on file  Occupational History   Occupation: none  Tobacco Use   Smoking status: Former    Packs/day: 1.00    Years: 18.00    Total pack years: 18.00    Types: Cigarettes    Quit date: 55    Years since quitting: 26.0   Smokeless tobacco: Never  Vaping Use   Vaping Use: Never used  Substance and Sexual Activity   Alcohol use: No   Drug use: No   Sexual activity: Not on file  Other Topics Concern   Not on file  Social History Narrative   Rents with family. Not on food stamps.   Social Determinants of Health   Financial Resource Strain: Low Risk  (12/07/2021)   Overall Financial Resource Strain (CARDIA)    Difficulty of Paying Living Expenses: Not hard at all  Food Insecurity: No Food Insecurity (06/21/2021)   Hunger Vital Sign    Worried About Running Out of Food in the Last Year: Never true    Ran Out of Food in the Last Year: Never true  Transportation Needs: No Transportation Needs (06/21/2021)   PRAPARE - Administrator, Civil Service (Medical): No    Lack of Transportation (Non-Medical): No  Physical Activity: Sufficiently Active (12/07/2021)   Exercise Vital Sign    Days of Exercise per Week: 7 days    Minutes of Exercise per Session: 60 min  Stress: No Stress Concern Present (12/07/2021)   [place]    Feeling of Stress : Not at all  Social Connections: Moderately Isolated (12/07/2021)   Social Connection and Isolation Panel [NHANES]    Frequency of Communication with Friends and Family: More than three times a week     Frequency of Social Gatherings with Friends and Family: More than three times a week    Attends Religious Services: Never    Marine scientist or Organizations: No    Attends Archivist Meetings: Never    Marital Status: Married  Human resources officer Violence: Not At Risk (12/07/2021)   Humiliation, Afraid, Rape, and Kick questionnaire    Fear of Current or Ex-Partner: No    Emotionally Abused: No    Physically Abused: No    Sexually Abused: No    Current Outpatient Medications on File Prior to Visit  Medication Sig Dispense Refill   albuterol (PROVENTIL HFA) 108 (90 Base) MCG/ACT inhaler INHALE 1 PUFF INTO THE LUNGS ONCE EVERY 6 HOURS AS NEEDED FOR WHEEZING OR SHORTNESS OF BREATH. 20.1 g 1   atorvastatin (LIPITOR) 80 MG tablet TAKE ONE TABLET BY MOUTH EVERY DAY 90 tablet 3   carvedilol (COREG) 3.125 MG tablet Take 1 tablet by mouth 2 (two) times daily with a meal. 180 tablet 3   cetirizine (ZYRTEC) 10 MG tablet TAKE ONE TABLET BY MOUTH ONCE EVERY DAY. 90 tablet 1   ibuprofen (ADVIL) 200 MG tablet Take 2 tablets (400 mg total) by mouth every 6 (six) hours as needed for mild pain. 30 tablet 0   losartan (COZAAR) 50 MG tablet TAKE ONE TABLET BY MOUTH ONCE EVERY DAY 90 tablet 3   Multiple Vitamin (MULTIVITAMIN WITH MINERALS) TABS tablet Take 1 tablet by mouth daily.     nitrofurantoin, macrocrystal-monohydrate, (MACROBID) 100 MG capsule Take 1 capsule (100 mg total) by mouth 2 (two) times daily. 10 capsule 0   spironolactone (ALDACTONE) 25 MG tablet Take 1 tablet (25 mg total) by mouth daily. 90 tablet 3   No current facility-administered medications on file prior to visit.      HPI   Lindsey Gibson is a 62 y.o.-year-old female, referred by her PCP, Dr.Chaplin, for evaluation for multinodular goiter and suppressed TSH (suggestive of hyperthyroidism).  She had total thyroidectomy on 05/05/21 and she was subsequently started on thyroid hormone at 100 mcg po daily before  breakfast.  Thyroid U/S: shows enlarged multinodular thyroid with 4+ moderately suspicious nodules recommending biopsy.  She also had uptake and scan done which was WNL.   I reviewed pt's thyroid tests: Lab Results  Component Value Date   TSH 1.740 01/17/2022   TSH 1.920 09/13/2021   TSH 2.030 07/12/2021   TSH 6.130 (H) 06/14/2021   TSH 0.130 (L) 05/10/2021   TSH 0.377 05/03/2021   TSH 0.290 (L) 02/22/2021   TSH 0.280 (L) 01/04/2021   TSH 0.297 (L) 09/28/2020   TSH 0.292 (L) 06/29/2020   FREET4 1.30 01/17/2022   FREET4 1.33 09/13/2021   FREET4 1.03 06/14/2021   FREET4 1.78 (H) 05/10/2021    No FH of thyroid ds. No FH of thyroid cancer. No h/o radiation tx to head or neck.  No seaweed or kelp. No recent contrast studies.  No steroid use. No herbal supplements. No Biotin supplements or Hair, Skin and Nails vitamins.  Pt also has a history of HTN, COPD, MI.  Review of systems  Constitutional: + Minimally fluctuating body weight,  current Body mass index is 36.07 kg/m. , no fatigue, no subjective hyperthermia, no subjective hypothermia Eyes: no blurry vision, no xerophthalmia ENT: no sore throat, no nodules palpated in throat, no dysphagia/odynophagia, no hoarseness Cardiovascular: no chest pain, no shortness of breath, no palpitations, no leg swelling Respiratory: no cough, no shortness of breath Gastrointestinal: no nausea/vomiting/diarrhea Musculoskeletal: no muscle/joint aches Skin: no rashes, no hyperemia Neurological: no tremors, no numbness, no tingling, no dizziness Psychiatric: no depression, no anxiety  ---------------------------------------------------------------------------------------------------------------------- Objective    BP 125/76 (BP Location: Left Arm, Patient Position: Sitting, Cuff Size: Large)   Pulse 81   Ht 5\' 11"  (1.803 m)   Wt 258 lb 9.6 oz (117.3 kg)   BMI 36.07 kg/m    BP Readings from Last 3 Encounters:  01/18/22 125/76  01/17/22  (!) 149/69  12/07/21 118/78    Wt Readings from Last 3 Encounters:  01/18/22 258 lb 9.6 oz (117.3 kg)  01/17/22 260 lb 6.4 oz (118.1 kg)  12/07/21 259 lb (117.5 kg)     Physical Exam- Limited  Constitutional:  Body mass index is 36.07 kg/m. , not in acute distress, normal state of mind Eyes:  EOMI, no exophthalmos Musculoskeletal: no gross deformities, strength intact in all four extremities, no gross restriction of joint movements Skin:  no rashes, no hyperemia Neurological: no tremor with outstretched hands    Latest Reference Range & Units 06/14/21 12:34 07/12/21 09:55 09/13/21 00:00 01/17/22 09:48  TSH 0.450 - 4.500 uIU/mL 6.130 (H) 2.030 1.920 1.740  T4,Free(Direct) 0.82 - 1.77 ng/dL 1.03  1.33 1.30  Thyroxine (T4) 4.5 - 12.0 ug/dL 7.3     (H): Data is abnormally high   ------------------------------------------------------------------------------------------------------------------------ Uptake and Scan from 02/17/21 CLINICAL DATA:  Suppressed TSH, weight loss, increased nervousness   EXAM: THYROID SCAN AND UPTAKE - 4 AND 24 HOURS   TECHNIQUE: Following oral administration of I-123 capsule, anterior planar imaging was acquired at 24 hours. Thyroid uptake was calculated with a thyroid probe at 4-6 hours and 24 hours.   RADIOPHARMACEUTICALS:  293.2 uCi I-123 sodium iodide p.o.   COMPARISON:  None   FINDINGS: Thyroid lobes appear enlarged bilaterally.   Both lobes demonstrate large cold masses involving the mid to inferior lobes.   4 hour I-123 uptake = 5.5% (normal 5-20%)   24 hour I-123 uptake = 18.4% (normal 10-30%)   IMPRESSION: Normal 4 hour and 24 hour radio iodine uptakes.   Large cold masses within the mid to inferior thyroid lobes bilaterally; thyroid ultrasound assessment recommended to exclude neoplasm.     Electronically Signed   By: Lavonia Dana M.D.   On: 02/17/2021  10:35  ---------------------------------------------------------------------------------------------------------------------------------- Thyroid US from 03/07/21 CLINICAL DATA:  Prior ultrasound follow-up.   EXAM: THYROID ULTRASOUND   TECHNIQUE: Ultrasound examination of the thyroid gland and adjacent soft tissues was performed.   COMPARISON:  Nuclear medicine thyroid scan February 2023   FINDINGS: Parenchymal Echotexture: Thyroid tissue largely replaced by numerous masses   Isthmus: 0.5 cm   Right lobe: 8.0 x 3.7 x 4.1 cm   Left lobe: 7.5 x 3.6 x 3.8 cm   _________________________________________________________   Estimated total number of nodules >/= 1 cm: >10   Number of spongiform nodules >/=  2 cm not described below (TR1): 0  Number of mixed cystic and solid nodules >/= 1.5 cm not described below (TR2): 0   _________________________________________________________   Nodule labeled 1 is a solid isoechoic TR 3 nodule in the right thyroid lobe measuring 2.3 x 2.2 x 1.6 cm. *Given size (>/= 1.5 - 2.4 cm) and appearance, a follow-up ultrasound in 1 year should be considered based on TI-RADS criteria.   Nodule labeled 2 is a solid isoechoic TR 3 nodule in the right thyroid lobe measuring 2.1 x 1.9 x 1.7 cm. *Given size (>/= 1.5 - 2.4 cm) and appearance, a follow-up ultrasound in 1 year should be considered based on TI-RADS criteria.   Nodule labeled 3 is a solid isoechoic TR 3 nodule in the right thyroid lobe measuring 2.2 x 2.1 x 1.7 cm. *Given size (>/= 1.5 - 2.4 cm) and appearance, a follow-up ultrasound in 1 year should be considered based on TI-RADS criteria.   Nodule labeled 4 is a solid hypoechoic TR 4 nodule in the right thyroid lobe measuring 2.1 x 2.0 x 1.8 cm. **Given size (>/= 1.5 cm) and appearance, fine needle aspiration of this moderately suspicious nodule should be considered based on TI-RADS criteria.   Nodule labeled 5 is a solid hypoechoic TR  4 nodule in the right thyroid lobe measuring 2.3 x 2.0 x 1.8 cm. **Given size (>/= 1.5 cm) and appearance, fine needle aspiration of this moderately suspicious nodule should be considered based on TI-RADS criteria.   Nodule labeled 6 is a solid isoechoic TR 3 nodule in the right thyroid lobe measuring 1.4 x 1.2 x 1.0 cm. Given size (<1.4 cm) and appearance, this nodule does NOT meet TI-RADS criteria for biopsy or dedicated follow-up.   Nodule labeled 7 is a solid isoechoic TR 3 nodule in the left thyroid lobe measuring 2.9 x 2.2 x 2.0 cm. **Given size (>/= 2.5 cm) and appearance, fine needle aspiration of this mildly suspicious nodule should be considered based on TI-RADS criteria.   Nodule labeled 8 is a solid isoechoic TR 3 nodule in the left thyroid lobe measuring 2.5 x 2.0 x 1.6 cm. **Given size (>/= 2.5 cm) and appearance, fine needle aspiration of this mildly suspicious nodule should be considered based on TI-RADS criteria.   Nodule labeled 9 is a solid isoechoic TR 3 nodule in the left thyroid lobe measuring 1.9 x 1.8 x 1.8 cm. *Given size (>/= 1.5 - 2.4 cm) and appearance, a follow-up ultrasound in 1 year should be considered based on TI-RADS criteria.   Nodule labeled 10 is a small mixed cystic and solid nodule in the left thyroid lobe measuring 1.1 cm. This nodule does NOT meet TI-RADS criteria for biopsy or dedicated follow-up.   IMPRESSION: 1. Enlarged multinodular thyroid gland with numerous nodules essentially replacing the normal thyroid parenchyma. 2. Nodules labeled 4, 5, 7 and 8 meet criteria for biopsy. Per ACR TI-RADS criteria, the 2 most suspicious nodules should be biopsied, which are the nodules labeled 4 and 5 (both of these are TR 4 nodules in the right thyroid lobe). 3. Nodules labeled 1, 2, 3 and 9 meet criteria for follow-up ultrasound in 1 year.   The above is in keeping with the ACR TI-RADS recommendations - J Am Coll Radiol 2017;14:587-595.      Electronically Signed   By: Olive Bass M.D.   On: 03/08/2021 11:11 ----------------------------------------------------------------------------------------------------------------------  ASSESSMENT / PLAN:  1. Multinodular goiter-s/p total thyroidectomy 2. Postsurgical hypothyroidism  She is s/p total thyroidectomy on 05/05/21 by Dr.  Franky Macho.    Her previsit thyroid function tests are consistent with appropriate hormone replacement.  She is advised to continue her Levothyroxine 100 mcg po daily before breakfast.   - The correct intake of thyroid hormone (Levothyroxine, Synthroid), is on empty stomach first thing in the morning, with water, separated by at least 30 minutes from breakfast and other medications,  and separated by more than 4 hours from calcium, iron, multivitamins, acid reflux medications (PPIs).  - This medication is a life-long medication and will be needed to correct thyroid hormone imbalances for the rest of your life.  The dose may change from time to time, based on thyroid blood work.  - It is extremely important to be consistent taking this medication, near the same time each morning.  -AVOID TAKING PRODUCTS CONTAINING BIOTIN (commonly found in Hair, Skin, Nails vitamins) AS IT INTERFERES WITH THE VALIDITY OF THYROID FUNCTION BLOOD TESTS.    Follow Up Plan:  Return in about 6 months (around 07/19/2022) for Thyroid follow up, Previsit labs.      I spent 22 minutes in the care of the patient today including review of labs from Thyroid Function, CMP, and other relevant labs ; imaging/biopsy records (current and previous including abstractions from other facilities); face-to-face time discussing  her lab results and symptoms, medications doses, her options of short and long term treatment based on the latest standards of care / guidelines;   and documenting the encounter.  [name]  participated in the discussions, expressed understanding, and voiced  agreement with the above plans.  All questions were answered to her satisfaction. she is encouraged to contact clinic should she have any questions or concerns prior to her return visit.  Ronny Bacon, [facility] [address] [place], [place] [address] Phone: [phone] Fax: [phone]

## 2022-01-19 ENCOUNTER — Other Ambulatory Visit: Payer: Self-pay

## 2022-01-21 LAB — MICROSCOPIC EXAMINATION
Casts: NONE SEEN /lpf
Epithelial Cells (non renal): NONE SEEN /hpf (ref 0–10)
WBC, UA: >30 /hpf — AB (ref 0–5)

## 2022-01-21 LAB — URINE CULTURE, REFLEX

## 2022-01-21 LAB — UA/M W/RFLX CULTURE, ROUTINE
Bilirubin, UA: NEGATIVE
Glucose, UA: NEGATIVE
Ketones, UA: NEGATIVE
Nitrite, UA: NEGATIVE
Specific Gravity, UA: 1.013 (ref 1.005–1.030)
Urobilinogen, Ur: 0.2 mg/dL (ref 0.2–1.0)
pH, UA: 7 (ref 5.0–7.5)

## 2022-01-21 LAB — TSH: TSH: 1.74 u[IU]/mL (ref 0.450–4.500)

## 2022-01-21 LAB — T4, FREE: Free T4: 1.3 ng/dL (ref 0.82–1.77)

## 2022-01-23 ENCOUNTER — Other Ambulatory Visit: Payer: Self-pay | Admitting: Gerontology

## 2022-01-23 ENCOUNTER — Other Ambulatory Visit: Payer: Self-pay

## 2022-01-23 DIAGNOSIS — N390 Urinary tract infection, site not specified: Secondary | ICD-10-CM

## 2022-01-23 MED ORDER — SULFAMETHOXAZOLE-TRIMETHOPRIM 800-160 MG PO TABS
1.0000 | ORAL_TABLET | Freq: Two times a day (BID) | ORAL | 0 refills | Status: DC
  Filled 2022-01-23: qty 20, 10d supply, fill #0

## 2022-01-24 ENCOUNTER — Ambulatory Visit: Payer: Self-pay | Admitting: Gerontology

## 2022-01-25 ENCOUNTER — Other Ambulatory Visit: Payer: Self-pay

## 2022-02-12 ENCOUNTER — Other Ambulatory Visit: Payer: Self-pay

## 2022-03-07 ENCOUNTER — Other Ambulatory Visit: Payer: Self-pay

## 2022-03-07 VITALS — BP 131/89 | HR 69 | Temp 98.0°F | Ht 71.0 in | Wt 262.4 lb

## 2022-03-07 DIAGNOSIS — E890 Postprocedural hypothyroidism: Secondary | ICD-10-CM

## 2022-03-07 DIAGNOSIS — N390 Urinary tract infection, site not specified: Secondary | ICD-10-CM

## 2022-03-07 NOTE — Progress Notes (Signed)
free 

## 2022-03-08 ENCOUNTER — Other Ambulatory Visit: Payer: Self-pay

## 2022-03-08 MED FILL — Cetirizine HCl Tab 10 MG: ORAL | 90 days supply | Qty: 90 | Fill #1 | Status: CN

## 2022-03-08 MED FILL — Cetirizine HCl Tab 10 MG: ORAL | 90 days supply | Qty: 90 | Fill #1 | Status: AC

## 2022-03-10 LAB — UA/M W/RFLX CULTURE, ROUTINE
Bilirubin, UA: NEGATIVE
Glucose, UA: NEGATIVE
Ketones, UA: NEGATIVE
Nitrite, UA: NEGATIVE
Protein,UA: NEGATIVE
RBC, UA: NEGATIVE
Specific Gravity, UA: 1.015 (ref 1.005–1.030)
Urobilinogen, Ur: 0.2 mg/dL (ref 0.2–1.0)
pH, UA: 5.5 (ref 5.0–7.5)

## 2022-03-10 LAB — MICROSCOPIC EXAMINATION
Bacteria, UA: NONE SEEN
Casts: NONE SEEN /lpf
RBC, Urine: NONE SEEN /hpf (ref 0–2)

## 2022-03-10 LAB — URINE CULTURE, REFLEX

## 2022-03-13 ENCOUNTER — Other Ambulatory Visit: Payer: Self-pay

## 2022-03-13 ENCOUNTER — Ambulatory Visit: Payer: Self-pay | Admitting: Gerontology

## 2022-03-13 DIAGNOSIS — J449 Chronic obstructive pulmonary disease, unspecified: Secondary | ICD-10-CM

## 2022-03-13 DIAGNOSIS — E785 Hyperlipidemia, unspecified: Secondary | ICD-10-CM

## 2022-03-13 MED ORDER — ATORVASTATIN CALCIUM 80 MG PO TABS
80.0000 mg | ORAL_TABLET | Freq: Every day | ORAL | 3 refills | Status: DC
  Filled 2022-03-13: qty 90, 90d supply, fill #0
  Filled 2022-03-13: qty 90, fill #0
  Filled 2022-05-21: qty 90, 90d supply, fill #0
  Filled 2022-08-27 – 2022-08-29 (×3): qty 90, 90d supply, fill #1
  Filled 2022-11-20: qty 90, 90d supply, fill #2
  Filled 2023-03-07: qty 90, 90d supply, fill #3

## 2022-03-13 MED ORDER — CETIRIZINE HCL 10 MG PO TABS
10.0000 mg | ORAL_TABLET | Freq: Every day | ORAL | 1 refills | Status: DC
  Filled 2022-03-13: qty 90, fill #0
  Filled 2022-03-13 – 2022-05-21 (×2): qty 90, 90d supply, fill #0
  Filled 2022-08-27 – 2022-08-28 (×2): qty 90, 90d supply, fill #1

## 2022-03-13 MED ORDER — ALBUTEROL SULFATE HFA 108 (90 BASE) MCG/ACT IN AERS
1.0000 | INHALATION_SPRAY | Freq: Four times a day (QID) | RESPIRATORY_TRACT | 1 refills | Status: DC | PRN
  Filled 2022-03-13: qty 20.1, fill #0

## 2022-03-13 NOTE — Patient Instructions (Signed)

## 2022-03-13 NOTE — Progress Notes (Signed)
Established Patient Office Visit  Subjective   Patient ID: Lindsey Gibson, female    DOB: August 18, 1960  Age: 62 y.o. MRN: [id number]  No chief complaint on file.  Patient was identified with 2 patient identifier and she consented to Telephone visit.  HPI  Lindsey Gibson is a 62 y/o female who has history of total thyroidectomy, anemia, anxiety, arthritis, asthma, CAD, COPD, hyperlipidemia, hypertension, ischemic cardiomyopathy, morbid obesity, myocardial infarction, who presents for follow-up visit and medication refill. She states that she's compliant with her medications, denies side effects and continues to make healthy lifestyle changes. She completed her antibiotic course for UTI, urine was rechecked and it shows trace Leukocytes, and the rest are within normal limits. She denies dysuria, urinary frequency, urgency, flank and pelvic pain. She was seen at the Endocrinology clinic on 01/18/22 by Lorretta Harp NP for Multinodular goiter-s/p total thyroidectomy and  Postsurgical hypothyroidism, she will continue on 100 mcg of Levothyroxine. Overall, she states that she's doing well and offers no further complaint.  Review of Systems  Constitutional: Negative.   Eyes: Negative.   Respiratory: Negative.    Cardiovascular: Negative.   Genitourinary: Negative.   Neurological: Negative.   Endo/Heme/Allergies: Negative.   Psychiatric/Behavioral: Negative.        Objective:     There were no vitals taken for this visit. BP Readings from Last 3 Encounters:  03/07/22 131/89  01/18/22 125/76  01/17/22 (!) 149/69   Wt Readings from Last 3 Encounters:  03/07/22 262 lb 6.4 oz (119 kg)  01/18/22 258 lb 9.6 oz (117.3 kg)  01/17/22 260 lb 6.4 oz (118.1 kg)      Physical Exam   No results found for any visits on 03/13/22.  Last CBC Lab Results  Component Value Date   WBC 6.2 11/29/2021   HGB 11.9 11/29/2021   HCT 37.5 11/29/2021   MCV 85 11/29/2021   MCH 26.8 11/29/2021   RDW  14.7 11/29/2021   PLT 248 [id number]   Last metabolic panel Lab Results  Component Value Date   GLUCOSE 99 11/29/2021   NA 144 11/29/2021   K 4.4 11/29/2021   CL 105 11/29/2021   CO2 24 11/29/2021   BUN 12 11/29/2021   CREATININE 0.81 11/29/2021   EGFR 82 11/29/2021   CALCIUM 9.3 11/29/2021   PHOS 3.5 05/06/2021   PROT 6.9 11/29/2021   ALBUMIN 4.5 11/29/2021   LABGLOB 2.4 11/29/2021   AGRATIO 1.9 11/29/2021   BILITOT 0.9 11/29/2021   ALKPHOS 86 11/29/2021   AST 18 11/29/2021   ALT 18 11/29/2021   ANIONGAP 6 05/06/2021   Last lipids Lab Results  Component Value Date   CHOL 139 11/29/2021   HDL 44 11/29/2021   LDLCALC 64 11/29/2021   TRIG 185 (H) 11/29/2021   CHOLHDL 3.2 11/29/2021   Last hemoglobin A1c Lab Results  Component Value Date   HGBA1C 5.8 (H) 06/14/2021   Last thyroid functions Lab Results  Component Value Date   TSH 1.740 01/17/2022   T3TOTAL 132 02/22/2021   T4TOTAL 7.3 06/14/2021   Last vitamin D No results found for: "25OHVITD2", "25OHVITD3", "VD25OH" Last vitamin B12 and Folate No results found for: "VITAMINB12", "FOLATE"    The ASCVD Risk score (Arnett DK, et al., 2019) failed to calculate for the following reasons:   The patient has a prior MI or stroke diagnosis    Assessment & Plan:   1. Chronic obstructive pulmonary disease, unspecified COPD type ([place]) - She will  continue on current medication. - cetirizine (ZYRTEC) 10 MG tablet; Take 1 tablet (10 mg total) by mouth daily.  Dispense: 90 tablet; Refill: 1 - albuterol (PROVENTIL HFA) 108 (90 Base) MCG/ACT inhaler; Inhale 1 puff into the lungs every 6 (six) hours as needed for wheezing or shortness of breath  Dispense: 20.1 g; Refill: 1  2. Elevated lipids - She will continue on current medication, low fat/cholesterol diet and exercise as tolerated. - atorvastatin (LIPITOR) 80 MG tablet; Take 1 tablet (80 mg total) by mouth daily.  Dispense: 90 tablet; Refill: 3   Return in about 5  months (around 08/02/2022), or if symptoms worsen or fail to improve.    Quayshawn Nin Jerold Coombe, NP

## 2022-05-21 ENCOUNTER — Other Ambulatory Visit: Payer: Self-pay

## 2022-05-29 IMAGING — US US THYROID
1 series · 12 of 25 positions shown · non-contrast
Comparison: Nuclear medicine thyroid scan February 2021

CLINICAL DATA: Prior ultrasound follow-up.

EXAM:
THYROID ULTRASOUND
TECHNIQUE: Ultrasound examination of the thyroid gland and adjacent soft
tissues was performed.

[Series 1: us thyroid · 0.08mm/px · 12 of 63 slices shown]
[im 3/63]
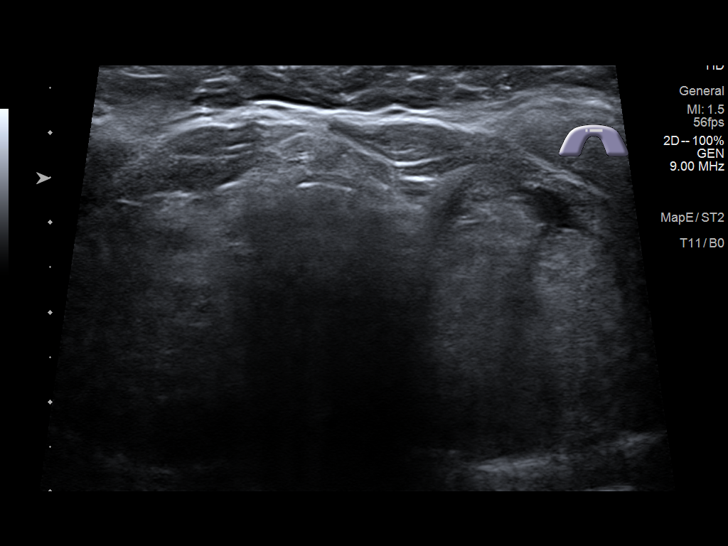
[im 8/63]
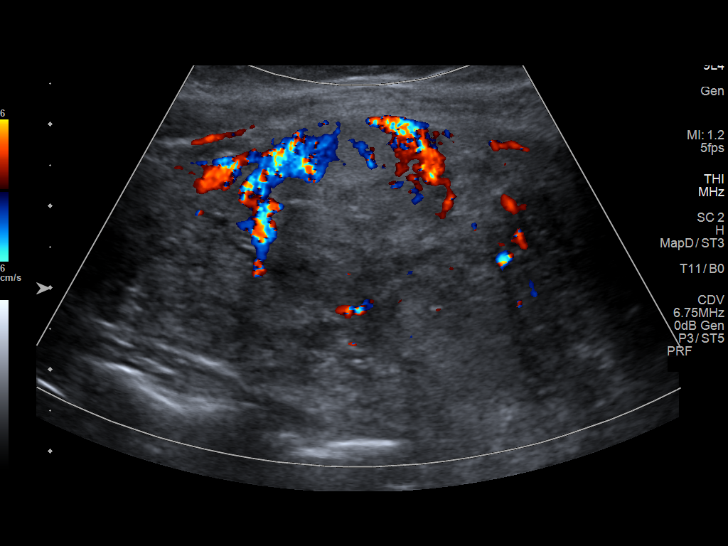
[im 13/63]
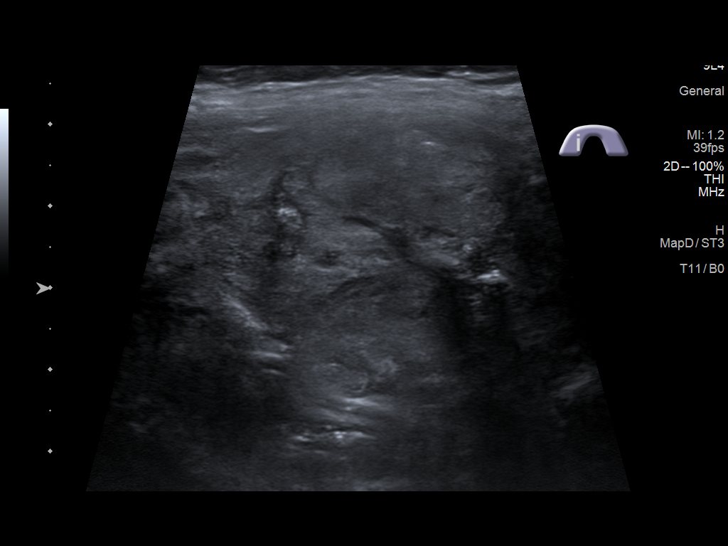
[im 19/63]
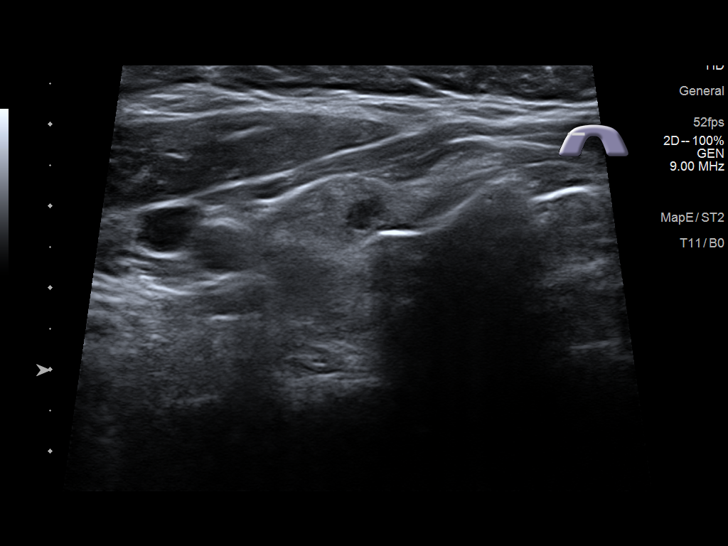
[im 24/63]
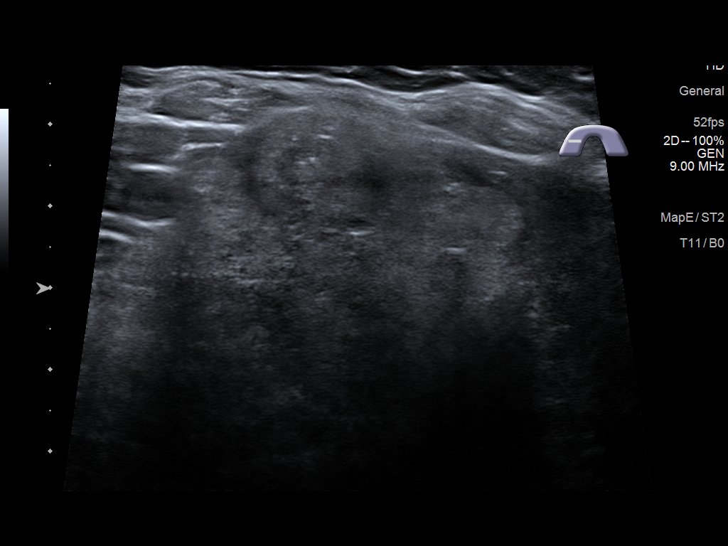
[im 29/63]
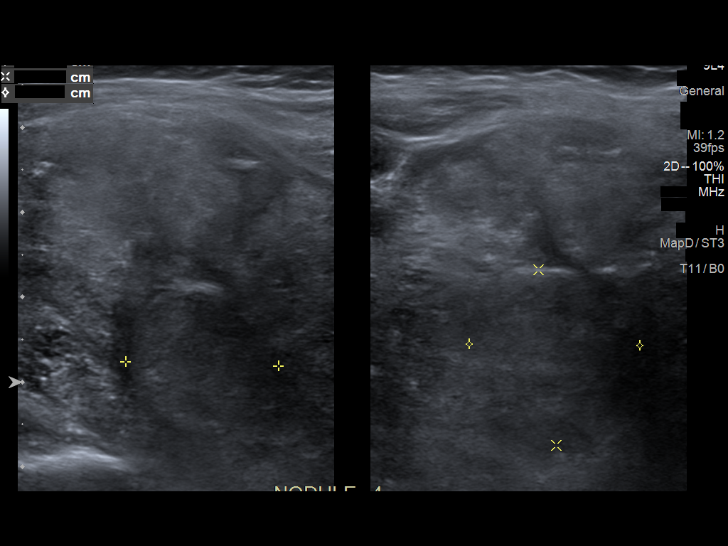
[im 34/63]
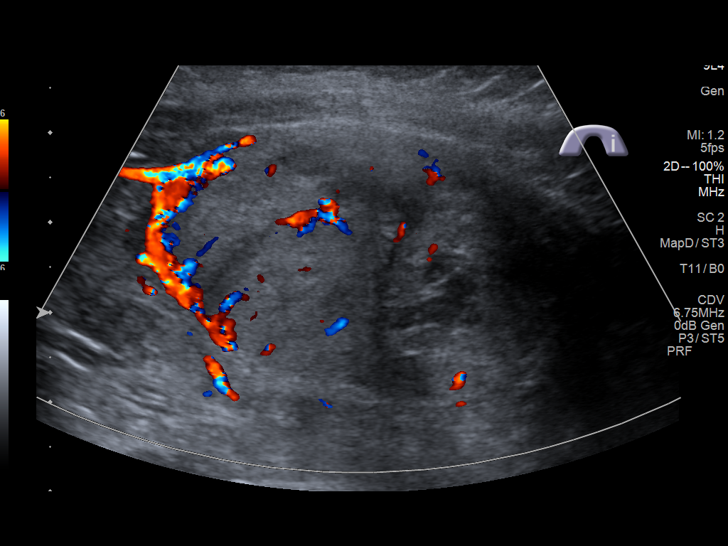
[im 39/63]
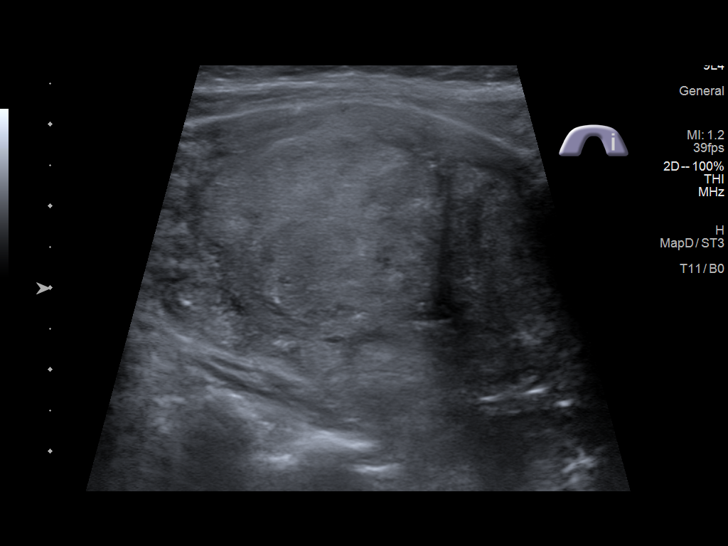
[im 44/63]
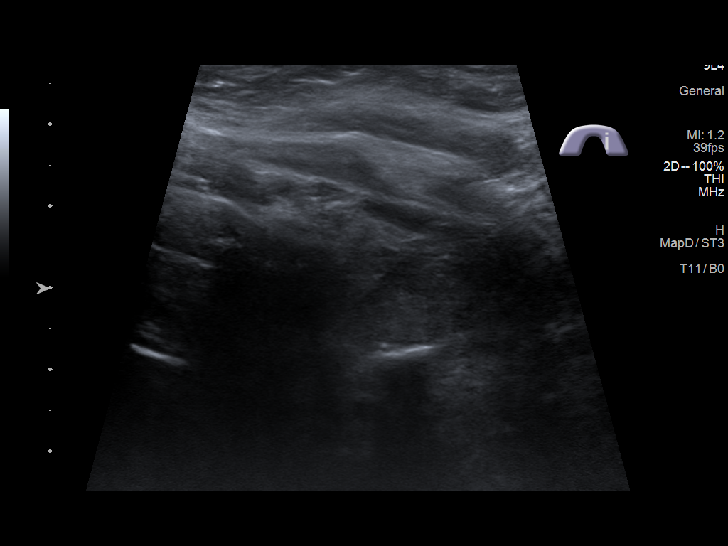
[im 50/63]
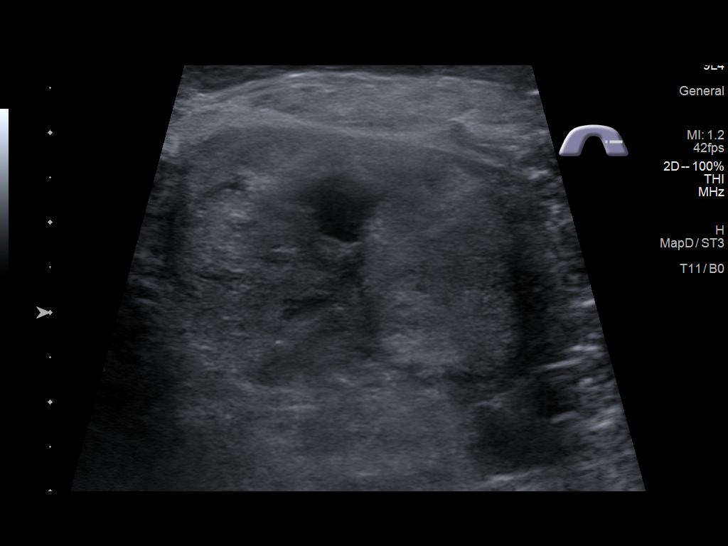
[im 55/63]
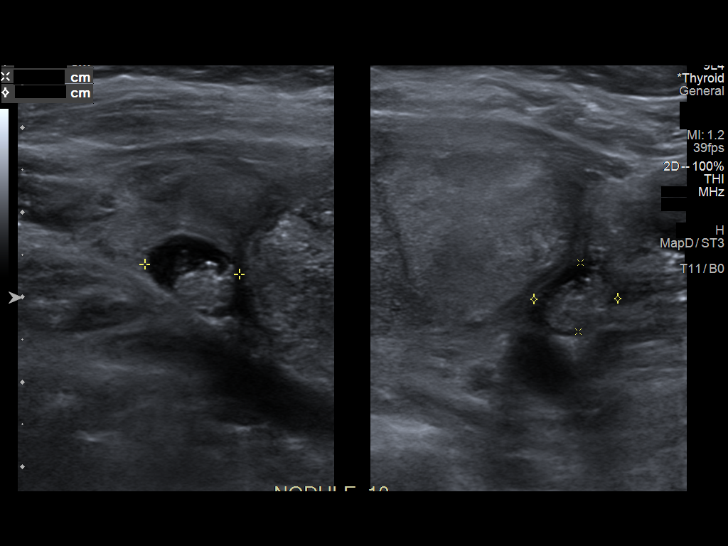
[im 60/63]
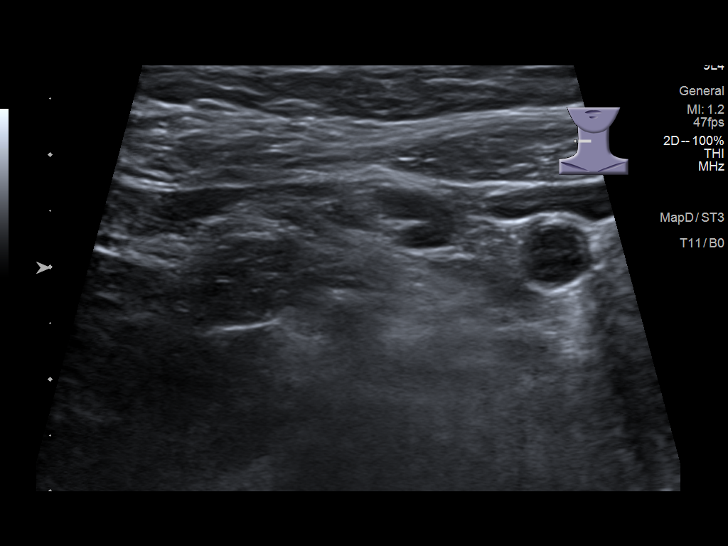

[12 of 25 positions shown; findings below may reference images not displayed]

FINDINGS: Parenchymal Echotexture: Thyroid tissue largely replaced by numerous
masses

Isthmus: 0.5 cm

Right lobe: 8.0 x 3.7 x 4.1 cm

Left lobe: 7.5 x 3.6 x 3.8 cm

_________________________________________________________

Estimated total number of nodules >/= 1 cm: >10

Number of spongiform nodules >/=  2 cm not described below (TR1): 0

Number of mixed cystic and solid nodules >/= 1.5 cm not described
below (TR2): 0

_________________________________________________________

Nodule labeled 1 is a solid isoechoic TR 3 nodule in the right
thyroid lobe measuring 2.3 x 2.2 x 1.6 cm. *Given size (>/= 1.5 -
2.4 cm) and appearance, a follow-up ultrasound in 1 year should be
considered based on TI-RADS criteria.

Nodule labeled 2 is a solid isoechoic TR 3 nodule in the right
thyroid lobe measuring 2.1 x 1.9 x 1.7 cm. *Given size (>/= 1.5 -
2.4 cm) and appearance, a follow-up ultrasound in 1 year should be
considered based on TI-RADS criteria.

Nodule labeled 3 is a solid isoechoic TR 3 nodule in the right
thyroid lobe measuring 2.2 x 2.1 x 1.7 cm. *Given size (>/= 1.5 -
2.4 cm) and appearance, a follow-up ultrasound in 1 year should be
considered based on TI-RADS criteria.

Nodule labeled 4 is a solid hypoechoic TR 4 nodule in the right
thyroid lobe measuring 2.1 x 2.0 x 1.8 cm. **Given size (>/= 1.5 cm)
and appearance, fine needle aspiration of this moderately suspicious
nodule should be considered based on TI-RADS criteria.

Nodule labeled 5 is a solid hypoechoic TR 4 nodule in the right
thyroid lobe measuring 2.3 x 2.0 x 1.8 cm. **Given size (>/= 1.5 cm)
and appearance, fine needle aspiration of this moderately suspicious
nodule should be considered based on TI-RADS criteria.

Nodule labeled 6 is a solid isoechoic TR 3 nodule in the right
thyroid lobe measuring 1.4 x 1.2 x 1.0 cm. Given size (<1.4 cm) and
appearance, this nodule does NOT meet TI-RADS criteria for biopsy or
dedicated follow-up.

Nodule labeled 7 is a solid isoechoic TR 3 nodule in the left
thyroid lobe measuring 2.9 x 2.2 x 2.0 cm. **Given size (>/= 2.5 cm)
and appearance, fine needle aspiration of this mildly suspicious
nodule should be considered based on TI-RADS criteria.

Nodule labeled 8 is a solid isoechoic TR 3 nodule in the left
thyroid lobe measuring 2.5 x 2.0 x 1.6 cm. **Given size (>/= 2.5 cm)
and appearance, fine needle aspiration of this mildly suspicious
nodule should be considered based on TI-RADS criteria.

Nodule labeled 9 is a solid isoechoic TR 3 nodule in the left
thyroid lobe measuring 1.9 x 1.8 x 1.8 cm. *Given size (>/= 1.5 -
2.4 cm) and appearance, a follow-up ultrasound in 1 year should be
considered based on TI-RADS criteria.

Nodule labeled 10 is a small mixed cystic and solid nodule in the
left thyroid lobe measuring 1.1 cm. This nodule does NOT meet
TI-RADS criteria for biopsy or dedicated follow-up.
IMPRESSION: 1. Enlarged multinodular thyroid gland with numerous nodules
essentially replacing the normal thyroid parenchyma.
2. Nodules labeled 4, 5, 7 and 8 meet criteria for biopsy. Per ACR
TI-RADS criteria, the 2 most suspicious nodules should be biopsied,
which are the nodules labeled 4 and 5 (both of these are TR 4
nodules in the right thyroid lobe).
3. Nodules labeled 1, 2, 3 and 9 meet criteria for follow-up
ultrasound in 1 year.

The above is in keeping with the ACR TI-RADS recommendations - [HOSPITAL] 6656;[DATE].

## 2022-07-11 ENCOUNTER — Other Ambulatory Visit: Payer: Self-pay

## 2022-07-11 DIAGNOSIS — J449 Chronic obstructive pulmonary disease, unspecified: Secondary | ICD-10-CM

## 2022-07-12 LAB — TSH: TSH: 3.77 u[IU]/mL (ref 0.450–4.500)

## 2022-07-12 LAB — T4, FREE: Free T4: 1.28 ng/dL (ref 0.82–1.77)

## 2022-07-13 LAB — URINE CULTURE

## 2022-07-15 LAB — URINE CULTURE

## 2022-07-18 ENCOUNTER — Other Ambulatory Visit: Payer: Self-pay

## 2022-07-18 ENCOUNTER — Other Ambulatory Visit: Payer: Self-pay | Admitting: Gerontology

## 2022-07-18 DIAGNOSIS — N390 Urinary tract infection, site not specified: Secondary | ICD-10-CM

## 2022-07-18 MED ORDER — SULFAMETHOXAZOLE-TRIMETHOPRIM 800-160 MG PO TABS
1.0000 | ORAL_TABLET | Freq: Two times a day (BID) | ORAL | 0 refills | Status: DC
  Filled 2022-07-18: qty 28, 14d supply, fill #0

## 2022-07-18 NOTE — Patient Instructions (Signed)

## 2022-07-19 ENCOUNTER — Other Ambulatory Visit: Payer: Self-pay

## 2022-07-19 ENCOUNTER — Encounter: Payer: Self-pay | Admitting: Nurse Practitioner

## 2022-07-19 ENCOUNTER — Ambulatory Visit (INDEPENDENT_AMBULATORY_CARE_PROVIDER_SITE_OTHER): Payer: Self-pay | Admitting: Nurse Practitioner

## 2022-07-19 VITALS — BP 112/72 | HR 74 | Ht 71.0 in | Wt 264.0 lb

## 2022-07-19 DIAGNOSIS — E042 Nontoxic multinodular goiter: Secondary | ICD-10-CM

## 2022-07-19 DIAGNOSIS — E890 Postprocedural hypothyroidism: Secondary | ICD-10-CM

## 2022-07-19 MED ORDER — LEVOTHYROXINE SODIUM 100 MCG PO TABS
100.0000 ug | ORAL_TABLET | Freq: Every day | ORAL | 3 refills | Status: DC
  Filled 2022-07-19 – 2022-08-29 (×2): qty 90, 90d supply, fill #0
  Filled 2022-12-05: qty 90, 90d supply, fill #1
  Filled 2023-03-07: qty 90, 90d supply, fill #2
  Filled 2023-05-07: qty 90, 90d supply, fill #3

## 2022-07-19 NOTE — Progress Notes (Signed)
Endocrinology Follow Up Note 07/19/22     ---------------------------------------------------------------------------------------------------------------------- Subjective    Past Medical History:  Diagnosis Date   Anemia    Anxiety    Arthritis    Asthma    CAD (coronary artery disease)    a. 12/2016 NSTEMI/PCI: LM nl, LAD 30ost, 73m (IVUS->severe plaque burden->3.0 x 18 Sierra DES), D1/2/3 nl, LCX 30p, OM1/2/3 nl, RCA nl, RPDA/RPAV/RPL1/2 nl.   COPD (chronic obstructive pulmonary disease) (HCC)    Hyperlipidemia    Hypertension    Ischemic cardiomyopathy    a. 12/2016 Echo: EF 20-25%, mid-apicalanteroseptal, ant, and apical AK, GR1 DD, triv AI, mildly dil LA; b. 04/2017 Echo: EF 50-55%, Gr2 DD, Nl RV fxn.   Morbid obesity (HCC)    Myocardial infarction (HCC) 2018    Past Surgical History:  Procedure Laterality Date   CORONARY STENT INTERVENTION N/A 12/27/2016   Procedure: CORONARY STENT INTERVENTION;  Surgeon: Iran Ouch, MD;  Location: [facility];  Service: Cardiovascular;  Laterality: N/A;   CORONARY ULTRASOUND/IVUS N/A 12/27/2016   Procedure: Intravascular Ultrasound/IVUS;  Surgeon: Iran Ouch, MD;  Location: [facility];  Service: Cardiovascular;  Laterality: N/A;   KNEE ARTHROSCOPY Right    LEFT HEART CATH AND CORONARY ANGIOGRAPHY N/A 12/27/2016   Procedure: LEFT HEART CATH AND CORONARY ANGIOGRAPHY;  Surgeon: Iran Ouch, MD;  Location: [facility];  Service: Cardiovascular;  Laterality: N/A;   SHOULDER ARTHROSCOPY WITH SUBACROMIAL DECOMPRESSION Right 11/22/2015   Procedure: SHOULDER ARTHROSCOPY WITH DEBRIDEMENT, DECOMPRESSION  AND BICEPS TENODESIS;  Surgeon: Christena Flake, MD;  Location: [facility];  Service: Orthopedics;  Laterality: Right;   THYROIDECTOMY N/A 05/05/2021   Procedure: THYROIDECTOMY, TOTAL;  Surgeon: Franky Macho, MD;  Location: [facility];  Service: General;  Laterality: N/A;   TONSILLECTOMY     ULNAR NERVE  TRANSPOSITION Right 11/22/2015   Procedure: ULNAR NERVE DECOMPRESSION/TRANSPOSITION;  Surgeon: Christena Flake, MD;  Location: [facility];  Service: Orthopedics;  Laterality: Right;    Social History   Socioeconomic History   Marital status: Married    Spouse name: Not on file   Number of children: Not on file   Years of education: Not on file   Highest education level: Not on file  Occupational History   Occupation: none  Tobacco Use   Smoking status: Former    Current packs/day: 0.00    Average packs/day: 1 pack/day for 18.0 years (18.0 ttl pk-yrs)    Types: Cigarettes    Start date: 62    Quit date: 55    Years since quitting: 26.5   Smokeless tobacco: Never  Vaping Use   Vaping status: Never Used  Substance and Sexual Activity   Alcohol use: No   Drug use: No   Sexual activity: Not on file  Other Topics Concern   Not on file  Social History Narrative   Rents with family. Not on food stamps.   Social Determinants of Health   Financial Resource Strain: Low Risk  (12/07/2021)   Overall Financial Resource Strain (CARDIA)    Difficulty of Paying Living Expenses: Not hard at all  Food Insecurity: No Food Insecurity (06/21/2021)   Hunger Vital Sign    Worried About Running Out of Food in the Last Year: Never true    Ran Out of Food in the Last Year: Never true  Transportation Needs: No Transportation Needs (06/21/2021)   PRAPARE - Administrator, Civil Service (Medical): No  Lack of Transportation (Non-Medical): No  Physical Activity: Sufficiently Active (12/07/2021)   Exercise Vital Sign    Days of Exercise per Week: 7 days    Minutes of Exercise per Session: 60 min  Stress: No Stress Concern Present (12/07/2021)   [employer] of Occupational Health - Occupational Stress Questionnaire    Feeling of Stress : Not at all  Social Connections: Moderately Isolated (12/07/2021)   Social Connection and Isolation Panel [NHANES]    Frequency of Communication  with Friends and Family: More than three times a week    Frequency of Social Gatherings with Friends and Family: More than three times a week    Attends Religious Services: Never    Database administrator or Organizations: No    Attends Banker Meetings: Never    Marital Status: Married  Catering manager Violence: Not At Risk (12/07/2021)   Humiliation, Afraid, Rape, and Kick questionnaire    Fear of Current or Ex-Partner: No    Emotionally Abused: No    Physically Abused: No    Sexually Abused: No    Current Outpatient Medications on File Prior to Visit  Medication Sig Dispense Refill   albuterol (PROVENTIL HFA) 108 (90 Base) MCG/ACT inhaler Inhale 1 puff into the lungs every 6 (six) hours as needed for wheezing or shortness of breath 20.1 g 1   atorvastatin (LIPITOR) 80 MG tablet Take 1 tablet (80 mg total) by mouth daily. 90 tablet 3   carvedilol (COREG) 3.125 MG tablet Take 1 tablet by mouth 2 (two) times daily with a meal. 180 tablet 3   cetirizine (ZYRTEC) 10 MG tablet Take 1 tablet (10 mg total) by mouth daily. 90 tablet 1   ibuprofen (ADVIL) 200 MG tablet Take 2 tablets (400 mg total) by mouth every 6 (six) hours as needed for mild pain. 30 tablet 0   losartan (COZAAR) 50 MG tablet TAKE ONE TABLET BY MOUTH ONCE EVERY DAY 90 tablet 3   Multiple Vitamin (MULTIVITAMIN WITH MINERALS) TABS tablet Take 1 tablet by mouth daily.     spironolactone (ALDACTONE) 25 MG tablet Take 1 tablet (25 mg total) by mouth daily. 90 tablet 3   sulfamethoxazole-trimethoprim (BACTRIM DS) 800-160 MG tablet Take 1 tablet by mouth 2 (two) times daily. 28 tablet 0   No current facility-administered medications on file prior to visit.      HPI   Lindsey Gibson is a 62 y.o.-year-old female, referred by her PCP, Dr.Chaplin, for evaluation for multinodular goiter and suppressed TSH (suggestive of hyperthyroidism).  She had total thyroidectomy on 05/05/21 and she was subsequently started on thyroid  hormone at 100 mcg po daily before breakfast.  Thyroid U/S: shows enlarged multinodular thyroid with 4+ moderately suspicious nodules recommending biopsy.  She also had uptake and scan done which was WNL.   I reviewed pt's thyroid tests: Lab Results  Component Value Date   TSH 3.770 07/11/2022   TSH 1.740 01/17/2022   TSH 1.920 09/13/2021   TSH 2.030 07/12/2021   TSH 6.130 (H) 06/14/2021   TSH 0.130 (L) 05/10/2021   TSH 0.377 05/03/2021   TSH 0.290 (L) 02/22/2021   TSH 0.280 (L) 01/04/2021   TSH 0.297 (L) 09/28/2020   FREET4 1.28 07/11/2022   FREET4 1.30 01/17/2022   FREET4 1.33 09/13/2021   FREET4 1.03 06/14/2021   FREET4 1.78 (H) 05/10/2021    No FH of thyroid ds. No FH of thyroid cancer. No h/o radiation tx to head or  neck.  No seaweed or kelp. No recent contrast studies. No steroid use. No herbal supplements. No Biotin supplements or Hair, Skin and Nails vitamins.  Pt also has a history of HTN, COPD, MI.  She does report multiple UTIs since she had her thyroid removed.  She is wondering if there is any association with that.  Review of systems  Constitutional: + Minimally fluctuating body weight,  current Body mass index is 36.82 kg/m. , no fatigue, no subjective hyperthermia, no subjective hypothermia Eyes: no blurry vision, no xerophthalmia ENT: no sore throat, no nodules palpated in throat, no dysphagia/odynophagia, no hoarseness Cardiovascular: no chest pain, no shortness of breath, no palpitations, no leg swelling Respiratory: no cough, no shortness of breath Gastrointestinal: no nausea/vomiting/diarrhea Musculoskeletal: no muscle/joint aches Skin: no rashes, no hyperemia Neurological: no tremors, no numbness, no tingling, no dizziness Psychiatric: no depression, no anxiety  ---------------------------------------------------------------------------------------------------------------------- Objective    BP 112/72 (BP Location: Left Arm, Patient Position:  Sitting, Cuff Size: Large)   Pulse 74   Ht 5\' 11"  (1.803 m)   Wt 264 lb (119.7 kg)   BMI 36.82 kg/m    BP Readings from Last 3 Encounters:  07/19/22 112/72  03/07/22 131/89  01/18/22 125/76    Wt Readings from Last 3 Encounters:  07/19/22 264 lb (119.7 kg)  03/07/22 262 lb 6.4 oz (119 kg)  01/18/22 258 lb 9.6 oz (117.3 kg)      Physical Exam- Limited  Constitutional:  Body mass index is 36.82 kg/m. , not in acute distress, normal state of mind Eyes:  EOMI, no exophthalmos Musculoskeletal: no gross deformities, strength intact in all four extremities, no gross restriction of joint movements Skin:  no rashes, no hyperemia Neurological: no tremor with outstretched hands    Latest Reference Range & Units 06/14/21 12:34 07/12/21 09:55 09/13/21 00:00 01/17/22 09:48 07/11/22 09:54  TSH 0.450 - 4.500 uIU/mL 6.130 (H) 2.030 1.920 1.740 3.770  T4,Free(Direct) 0.82 - 1.77 ng/dL 1.61  0.96 0.45 4.09  Thyroxine (T4) 4.5 - 12.0 ug/dL 7.3      (H): Data is abnormally high   ------------------------------------------------------------------------------------------------------------------------ Uptake and Scan from 02/17/21 CLINICAL DATA:  Suppressed TSH, weight loss, increased nervousness   EXAM: THYROID SCAN AND UPTAKE - 4 AND 24 HOURS   TECHNIQUE: Following oral administration of I-123 capsule, anterior planar imaging was acquired at 24 hours. Thyroid uptake was calculated with a thyroid probe at 4-6 hours and 24 hours.   RADIOPHARMACEUTICALS:  293.2 uCi I-123 sodium iodide p.o.   COMPARISON:  None   FINDINGS: Thyroid lobes appear enlarged bilaterally.   Both lobes demonstrate large cold masses involving the mid to inferior lobes.   4 hour I-123 uptake = 5.5% (normal 5-20%)   24 hour I-123 uptake = 18.4% (normal 10-30%)   IMPRESSION: Normal 4 hour and 24 hour radio iodine uptakes.   Large cold masses within the mid to inferior thyroid lobes bilaterally; thyroid  ultrasound assessment recommended to exclude neoplasm.     Electronically Signed   By: Ulyses Southward M.D.   On: 02/17/2021 10:35  ---------------------------------------------------------------------------------------------------------------------------------- Thyroid US from 03/07/21 CLINICAL DATA:  Prior ultrasound follow-up.   EXAM: THYROID ULTRASOUND   TECHNIQUE: Ultrasound examination of the thyroid gland and adjacent soft tissues was performed.   COMPARISON:  Nuclear medicine thyroid scan February 2023   FINDINGS: Parenchymal Echotexture: Thyroid tissue largely replaced by numerous masses   Isthmus: 0.5 cm   Right lobe: 8.0 x 3.7 x 4.1 cm   Left  lobe: 7.5 x 3.6 x 3.8 cm   _________________________________________________________   Estimated total number of nodules >/= 1 cm: >10   Number of spongiform nodules >/=  2 cm not described below (TR1): 0   Number of mixed cystic and solid nodules >/= 1.5 cm not described below (TR2): 0   _________________________________________________________   Nodule labeled 1 is a solid isoechoic TR 3 nodule in the right thyroid lobe measuring 2.3 x 2.2 x 1.6 cm. *Given size (>/= 1.5 - 2.4 cm) and appearance, a follow-up ultrasound in 1 year should be considered based on TI-RADS criteria.   Nodule labeled 2 is a solid isoechoic TR 3 nodule in the right thyroid lobe measuring 2.1 x 1.9 x 1.7 cm. *Given size (>/= 1.5 - 2.4 cm) and appearance, a follow-up ultrasound in 1 year should be considered based on TI-RADS criteria.   Nodule labeled 3 is a solid isoechoic TR 3 nodule in the right thyroid lobe measuring 2.2 x 2.1 x 1.7 cm. *Given size (>/= 1.5 - 2.4 cm) and appearance, a follow-up ultrasound in 1 year should be considered based on TI-RADS criteria.   Nodule labeled 4 is a solid hypoechoic TR 4 nodule in the right thyroid lobe measuring 2.1 x 2.0 x 1.8 cm. **Given size (>/= 1.5 cm) and appearance, fine needle aspiration of  this moderately suspicious nodule should be considered based on TI-RADS criteria.   Nodule labeled 5 is a solid hypoechoic TR 4 nodule in the right thyroid lobe measuring 2.3 x 2.0 x 1.8 cm. **Given size (>/= 1.5 cm) and appearance, fine needle aspiration of this moderately suspicious nodule should be considered based on TI-RADS criteria.   Nodule labeled 6 is a solid isoechoic TR 3 nodule in the right thyroid lobe measuring 1.4 x 1.2 x 1.0 cm. Given size (<1.4 cm) and appearance, this nodule does NOT meet TI-RADS criteria for biopsy or dedicated follow-up.   Nodule labeled 7 is a solid isoechoic TR 3 nodule in the left thyroid lobe measuring 2.9 x 2.2 x 2.0 cm. **Given size (>/= 2.5 cm) and appearance, fine needle aspiration of this mildly suspicious nodule should be considered based on TI-RADS criteria.   Nodule labeled 8 is a solid isoechoic TR 3 nodule in the left thyroid lobe measuring 2.5 x 2.0 x 1.6 cm. **Given size (>/= 2.5 cm) and appearance, fine needle aspiration of this mildly suspicious nodule should be considered based on TI-RADS criteria.   Nodule labeled 9 is a solid isoechoic TR 3 nodule in the left thyroid lobe measuring 1.9 x 1.8 x 1.8 cm. *Given size (>/= 1.5 - 2.4 cm) and appearance, a follow-up ultrasound in 1 year should be considered based on TI-RADS criteria.   Nodule labeled 10 is a small mixed cystic and solid nodule in the left thyroid lobe measuring 1.1 cm. This nodule does NOT meet TI-RADS criteria for biopsy or dedicated follow-up.   IMPRESSION: 1. Enlarged multinodular thyroid gland with numerous nodules essentially replacing the normal thyroid parenchyma. 2. Nodules labeled 4, 5, 7 and 8 meet criteria for biopsy. Per ACR TI-RADS criteria, the 2 most suspicious nodules should be biopsied, which are the nodules labeled 4 and 5 (both of these are TR 4 nodules in the right thyroid lobe). 3. Nodules labeled 1, 2, 3 and 9 meet criteria for  follow-up ultrasound in 1 year.   The above is in keeping with the ACR TI-RADS recommendations - J Am Coll Radiol 2017;14:587-595.  Electronically Signed   By: Olive Bass M.D.   On: 03/08/2021 11:11 ----------------------------------------------------------------------------------------------------------------------  ASSESSMENT / PLAN:  1. Multinodular goiter-s/p total thyroidectomy 2. Postsurgical hypothyroidism  She is s/p total thyroidectomy on 05/05/21 by Dr. Franky Macho.    Her previsit thyroid function tests are consistent with appropriate hormone replacement.  She is advised to continue her Levothyroxine 100 mcg po daily before breakfast.   - The correct intake of thyroid hormone (Levothyroxine, Synthroid), is on empty stomach first thing in the morning, with water, separated by at least 30 minutes from breakfast and other medications,  and separated by more than 4 hours from calcium, iron, multivitamins, acid reflux medications (PPIs).  - This medication is a life-long medication and will be needed to correct thyroid hormone imbalances for the rest of your life.  The dose may change from time to time, based on thyroid blood work.  - It is extremely important to be consistent taking this medication, near the same time each morning.  -AVOID TAKING PRODUCTS CONTAINING BIOTIN (commonly found in Hair, Skin, Nails vitamins) AS IT INTERFERES WITH THE VALIDITY OF THYROID FUNCTION BLOOD TESTS.  I do not feel there is really any clinical association between her UTIs and her thyroidectomy.  She may need referral to urologist in the future if she continues to get multiple UTIs.  Follow Up Plan:  Return in about 1 year (around 07/19/2023) for Thyroid follow up, Previsit labs.     I spent  17  minutes in the care of the patient today including review of labs from Thyroid Function, CMP, and other relevant labs ; imaging/biopsy records (current and previous including abstractions  from other facilities); face-to-face time discussing  her lab results and symptoms, medications doses, her options of short and long term treatment based on the latest standards of care / guidelines;   and documenting the encounter.  [name]  participated in the discussions, expressed understanding, and voiced agreement with the above plans.  All questions were answered to her satisfaction. she is encouraged to contact clinic should she have any questions or concerns prior to her return visit.    Ronny Bacon, [facility] [address] [place], [place] [address] Phone: [phone] Fax: [phone]

## 2022-07-25 IMAGING — DX DG CHEST 2V
2 series · 2 of 2 positions shown · non-contrast
Comparison: December 27, 2016

CLINICAL DATA: Preoperative study.  Goiter.

EXAM:
CHEST - 2 VIEW

[chest pa]
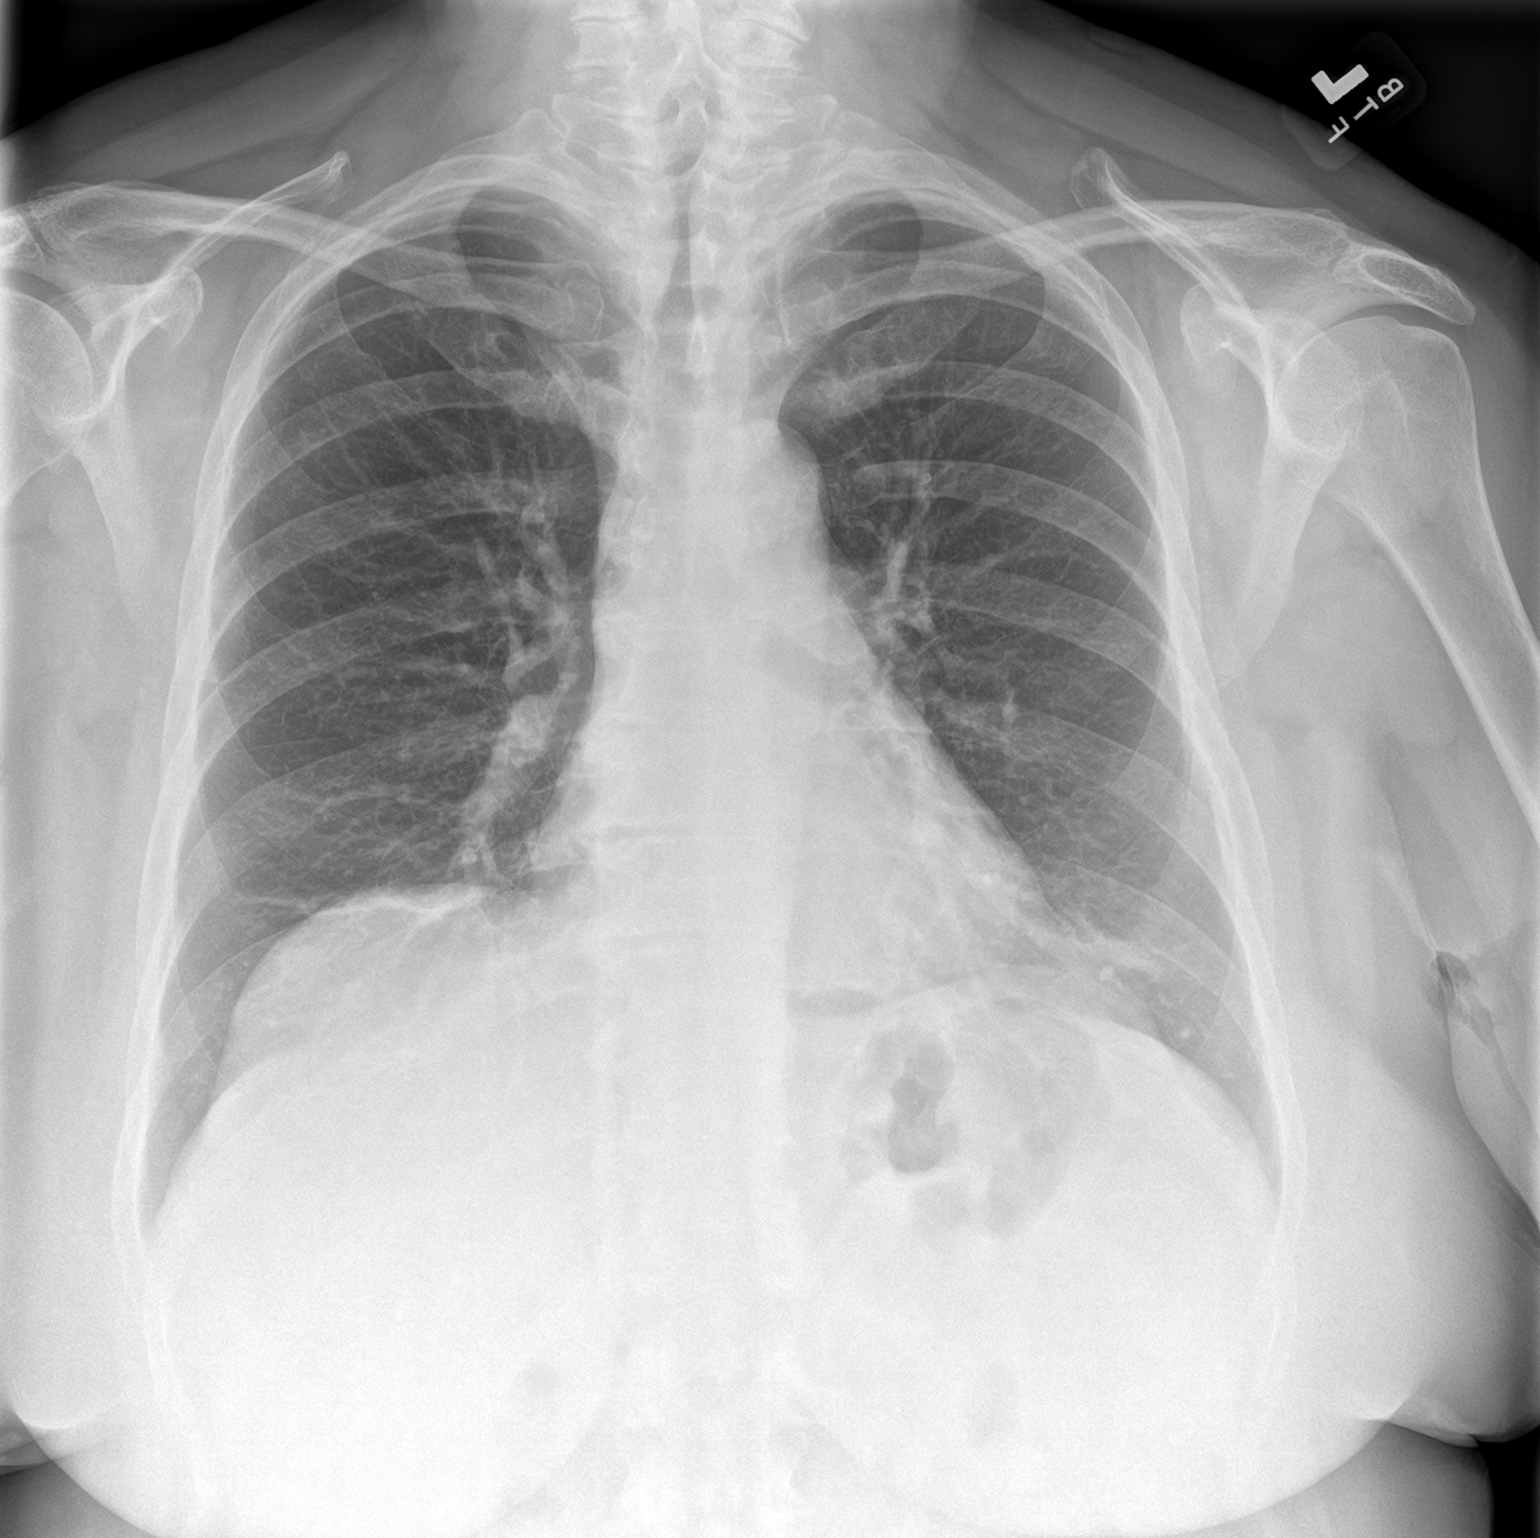

[chest lat]
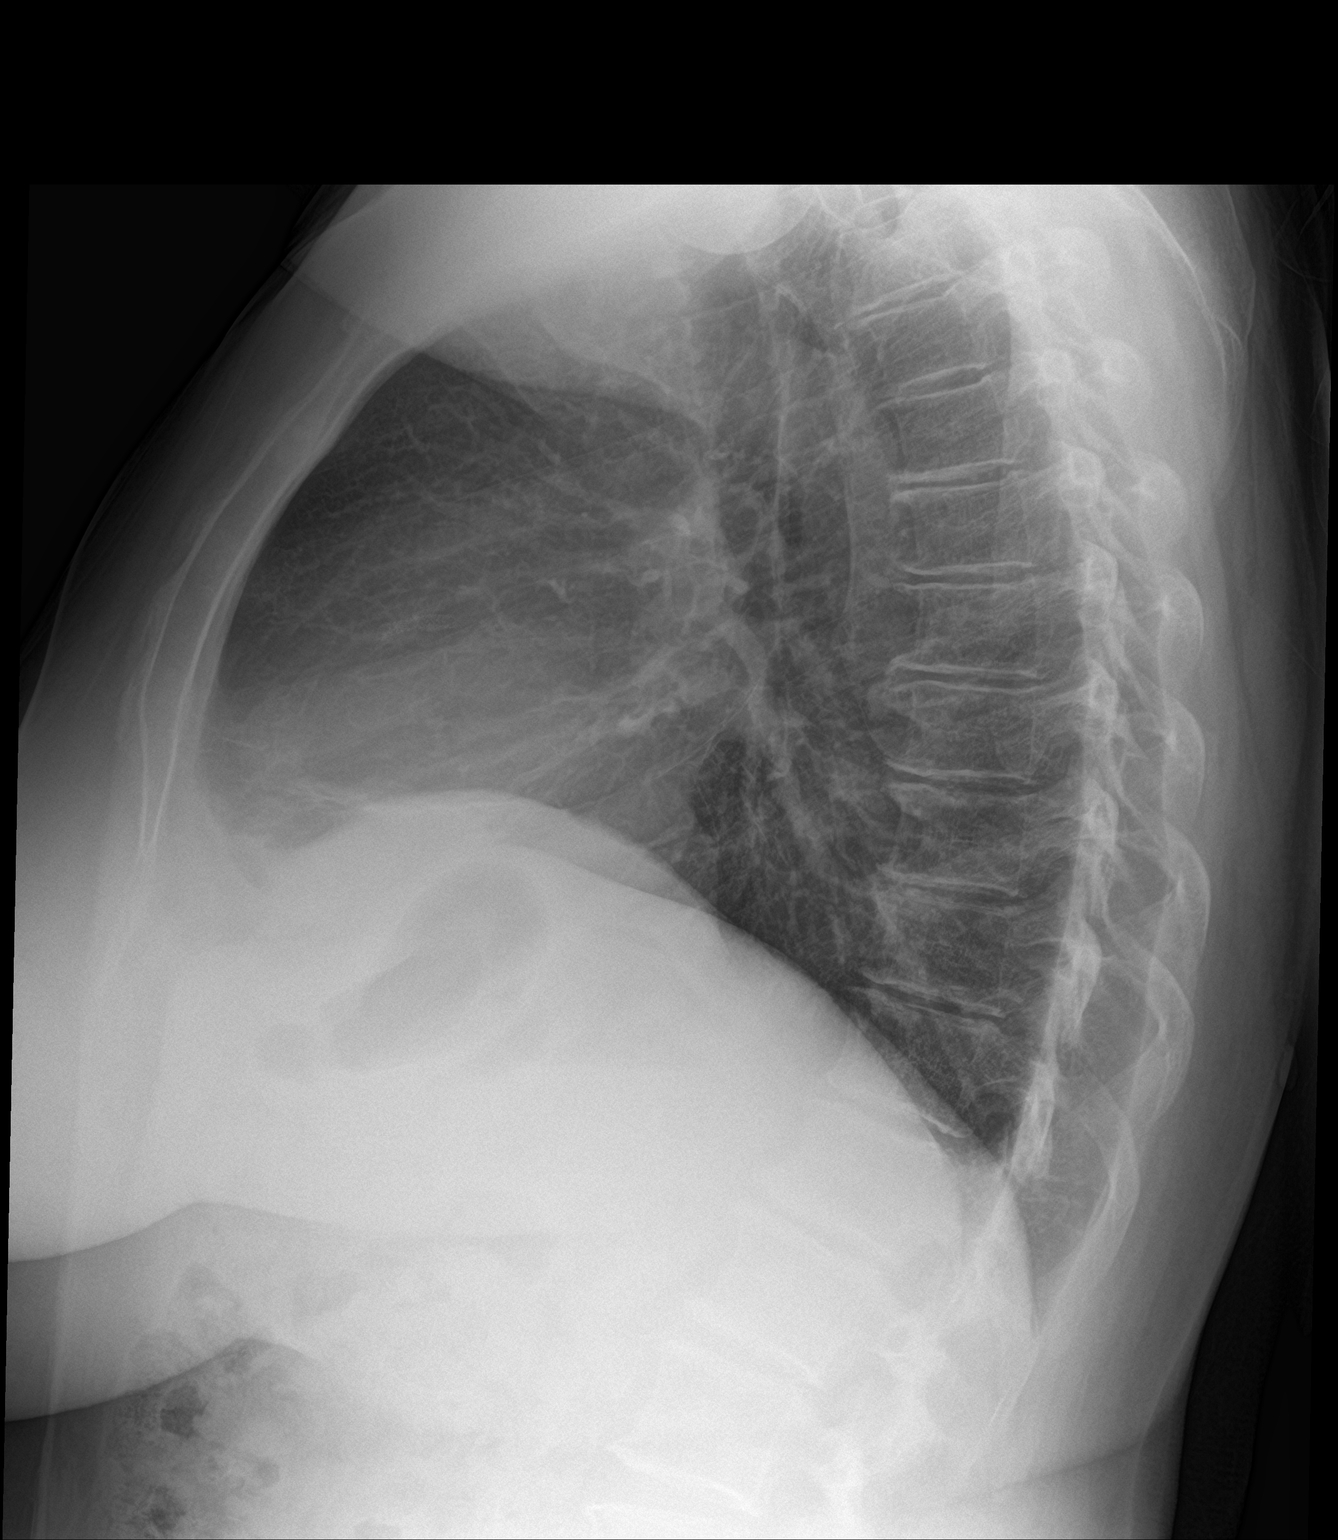

[2 of 2 positions shown; findings below may reference images not displayed]

FINDINGS: Platelike opacity in the right base and the lingula is likely
atelectasis. No pneumothorax. No nodules or masses. No suspicious
infiltrates. The cardiomediastinal silhouette is normal.
IMPRESSION: Right basilar and left lingular subsegmental atelectasis. No other
acute abnormalities.

## 2022-07-27 ENCOUNTER — Ambulatory Visit: Payer: Self-pay | Attending: Cardiovascular Disease | Admitting: Cardiovascular Disease

## 2022-07-27 ENCOUNTER — Encounter: Payer: Self-pay | Admitting: Cardiovascular Disease

## 2022-07-27 VITALS — BP 110/60 | HR 60 | Ht 71.0 in | Wt 263.5 lb

## 2022-07-27 DIAGNOSIS — I2510 Atherosclerotic heart disease of native coronary artery without angina pectoris: Secondary | ICD-10-CM

## 2022-07-27 DIAGNOSIS — E785 Hyperlipidemia, unspecified: Secondary | ICD-10-CM

## 2022-07-27 DIAGNOSIS — I10 Essential (primary) hypertension: Secondary | ICD-10-CM

## 2022-07-27 DIAGNOSIS — I5022 Chronic systolic (congestive) heart failure: Secondary | ICD-10-CM

## 2022-07-27 NOTE — Patient Instructions (Signed)
Medication Instructions:  No changes *If you need a refill on your cardiac medications before your next appointment, please call your pharmacy*   Lab Work: None ordered If you have labs (blood work) drawn today and your tests are completely normal, you will receive your results only by: MyChart Message (if you have MyChart) OR A paper copy in the mail If you have any lab test that is abnormal or we need to change your treatment, we will call you to review the results.   Testing/Procedures: None ordered   Follow-Up: At [facility], you and your health needs are our priority.  As part of our continuing mission to provide you with exceptional heart care, we have created designated Provider Care Teams.  These Care Teams include your primary Cardiologist (physician) and Advanced Practice Providers (APPs -  Physician Assistants and Nurse Practitioners) who all work together to provide you with the care you need, when you need it.  We recommend signing up for the patient portal called "MyChart".  Sign up information is provided on this After Visit Summary.  MyChart is used to connect with patients for Virtual Visits (Telemedicine).  Patients are able to view lab/test results, encounter notes, upcoming appointments, etc.  Non-urgent messages can be sent to your provider as well.   To learn more about what you can do with MyChart, go to [email or web address].    Your next appointment:   12 month(s)  Provider:   You may see Muhammad Arida, MD or one of the following Advanced Practice Providers on your designated Care Team:   Christopher Berge, NP Ryan Dunn, PA-C Cadence Furth, PA-C Sheri Hammock, NP    

## 2022-07-27 NOTE — Progress Notes (Signed)
Cardiology Office Note   Date:  07/27/2022   ID:  Lindsey Gibson, DOB 10-20-1960, MRN [id number]  PCP:  Rolm Gala, NP  Cardiologist:   Lorine Bears, MD   Chief Complaint  Patient presents with   Follow-up    12 month f/u no complaints today. Meds reviewed verbally with pt.      History of Present Illness: Lindsey Gibson is a 62 y.o. female who presents for a follow-up visit regarding coronary artery disease and chronic systolic heart failure.  Other medical problems include hyperlipidemia, obesity and COPD. She was hospitalized in December, 2018 with pulmonary edema and non-ST elevation myocardial infarction.  Echocardiogram showed an EF of 20-25%.  Cardiac catheterization showed severe mid LAD disease which was treated with PCI and drug-eluting stent placement.  She did not tolerate Entresto due to hypotension and recurrent chest pain.   She underwent a repeat echocardiogram in April of 2019 which showed improvement in EF to 50-55%.  There was evidence of grade 2 diastolic dysfunction.  She underwent thyroidectomy in May of 2023 due to thyroid goiter.  I discontinued furosemide last year.  She has been doing well with no chest pain, shortness of breath or palpitations.  No side effects with medications.  Past Medical History:  Diagnosis Date   Anemia    Anxiety    Arthritis    Asthma    CAD (coronary artery disease)    a. 12/2016 NSTEMI/PCI: LM nl, LAD 30ost, 40m (IVUS->severe plaque burden->3.0 x 18 Sierra DES), D1/2/3 nl, LCX 30p, OM1/2/3 nl, RCA nl, RPDA/RPAV/RPL1/2 nl.   COPD (chronic obstructive pulmonary disease) (HCC)    Hyperlipidemia    Hypertension    Ischemic cardiomyopathy    a. 12/2016 Echo: EF 20-25%, mid-apicalanteroseptal, ant, and apical AK, GR1 DD, triv AI, mildly dil LA; b. 04/2017 Echo: EF 50-55%, Gr2 DD, Nl RV fxn.   Morbid obesity (HCC)    Myocardial infarction (HCC) 2018    Past Surgical History:  Procedure Laterality Date    CORONARY STENT INTERVENTION N/A 12/27/2016   Procedure: CORONARY STENT INTERVENTION;  Surgeon: Iran Ouch, MD;  Location: [facility];  Service: Cardiovascular;  Laterality: N/A;   CORONARY ULTRASOUND/IVUS N/A 12/27/2016   Procedure: Intravascular Ultrasound/IVUS;  Surgeon: Iran Ouch, MD;  Location: [facility];  Service: Cardiovascular;  Laterality: N/A;   KNEE ARTHROSCOPY Right    LEFT HEART CATH AND CORONARY ANGIOGRAPHY N/A 12/27/2016   Procedure: LEFT HEART CATH AND CORONARY ANGIOGRAPHY;  Surgeon: Iran Ouch, MD;  Location: [facility];  Service: Cardiovascular;  Laterality: N/A;   SHOULDER ARTHROSCOPY WITH SUBACROMIAL DECOMPRESSION Right 11/22/2015   Procedure: SHOULDER ARTHROSCOPY WITH DEBRIDEMENT, DECOMPRESSION  AND BICEPS TENODESIS;  Surgeon: Christena Flake, MD;  Location: [facility];  Service: Orthopedics;  Laterality: Right;   THYROIDECTOMY N/A 05/05/2021   Procedure: THYROIDECTOMY, TOTAL;  Surgeon: Franky Macho, MD;  Location: [facility];  Service: General;  Laterality: N/A;   TONSILLECTOMY     ULNAR NERVE TRANSPOSITION Right 11/22/2015   Procedure: ULNAR NERVE DECOMPRESSION/TRANSPOSITION;  Surgeon: Christena Flake, MD;  Location: [facility];  Service: Orthopedics;  Laterality: Right;     Current Outpatient Medications  Medication Sig Dispense Refill   albuterol (PROVENTIL HFA) 108 (90 Base) MCG/ACT inhaler Inhale 1 puff into the lungs every 6 (six) hours as needed for wheezing or shortness of breath 20.1 g 1   atorvastatin (LIPITOR) 80 MG tablet Take 1  tablet (80 mg total) by mouth daily. 90 tablet 3   carvedilol (COREG) 3.125 MG tablet Take 1 tablet by mouth 2 (two) times daily with a meal. 180 tablet 3   cetirizine (ZYRTEC) 10 MG tablet Take 1 tablet (10 mg total) by mouth daily. 90 tablet 1   ibuprofen (ADVIL) 200 MG tablet Take 2 tablets (400 mg total) by mouth every 6 (six) hours as needed for mild pain. 30 tablet 0   levothyroxine  (SYNTHROID) 100 MCG tablet Take 1 tablet (100 mcg total) by mouth daily before breakfast. 90 tablet 3   losartan (COZAAR) 50 MG tablet TAKE ONE TABLET BY MOUTH ONCE EVERY DAY 90 tablet 3   Multiple Vitamin (MULTIVITAMIN WITH MINERALS) TABS tablet Take 1 tablet by mouth daily.     spironolactone (ALDACTONE) 25 MG tablet Take 1 tablet (25 mg total) by mouth daily. 90 tablet 3   No current facility-administered medications for this visit.    Allergies:   Penicillins    Social History:  The patient  reports that she quit smoking about 26 years ago. Her smoking use included cigarettes. She started smoking about 44 years ago. She has a 18 pack-year smoking history. She has never used smokeless tobacco. She reports that she does not drink alcohol and does not use drugs.   Family History:  The patient's family history includes Diabetes in her brother, mother, and sister; Heart disease in her mother and sister; Hypertension in her mother; Other in her father, maternal grandfather, maternal grandmother, paternal grandfather, and paternal grandmother.    ROS:  Please see the history of present illness.   Otherwise, review of systems are positive for none.   All other systems are reviewed and negative.    PHYSICAL EXAM: VS:  BP 110/60 (BP Location: Left Arm, Patient Position: Sitting, Cuff Size: Large)   Pulse 60   Ht 5\' 11"  (1.803 m)   Wt 263 lb 8 oz (119.5 kg)   SpO2 98%   BMI 36.75 kg/m  , BMI Body mass index is 36.75 kg/m. GEN: Well nourished, well developed, in no acute distress  HEENT: normal  Neck: no JVD, carotid bruits, or masses Cardiac: RRR; no murmurs, rubs, or gallops, no edema. Respiratory:  clear to auscultation bilaterally, normal work of breathing GI: soft, nontender, nondistended, + BS MS: no deformity or atrophy  Skin: warm and dry, no rash Neuro:  Strength and sensation are intact Psych: euthymic mood, full affect   EKG:  EKG is ordered today. EKG showed : Normal  sinus rhythm Normal ECG When compared with ECG of 28-Dec-2016 06:28, T wave inversion no longer evident in Anterolateral leads QT has shortened    Recent Labs: 11/29/2021: ALT 18; BUN 12; Creatinine, Ser 0.81; Hemoglobin 11.9; Platelets 248; Potassium 4.4; Sodium 144 07/11/2022: TSH 3.770    Lipid Panel    Component Value Date/Time   CHOL 139 11/29/2021 0942   TRIG 185 (H) 11/29/2021 0942   HDL 44 11/29/2021 0942   CHOLHDL 3.2 11/29/2021 0942   CHOLHDL 4.7 12/28/2016 0642   VLDL 24 12/28/2016 0642   LDLCALC 64 11/29/2021 0942      Wt Readings from Last 3 Encounters:  07/27/22 263 lb 8 oz (119.5 kg)  07/19/22 264 lb (119.7 kg)  03/07/22 262 lb 6.4 oz (119 kg)           No data to display            ASSESSMENT AND PLAN:  1.  Coronary artery disease involving native coronary arteries without angina: Continue aspirin indefinitely.  2.  Chronic systolic heart failure: Ejection fraction improved to 50-55%.  No evidence of volume overload without any diuretics other than spironolactone.  Continue losartan and carvedilol.  3.  Hyperlipidemia: I reviewed most recent lipid profile which showed an LDL of 64.  Continue atorvastatin 80 mg daily.  4.  Essential hypertension: Blood pressure is well controlled on current medications.    Disposition:   FU with me in 12 months  Signed,  Lorine Bears, MD  07/27/2022 9:05 AM    [facility]

## 2022-08-02 ENCOUNTER — Ambulatory Visit: Payer: Self-pay | Admitting: Gerontology

## 2022-08-02 ENCOUNTER — Other Ambulatory Visit: Payer: Self-pay

## 2022-08-02 ENCOUNTER — Encounter: Payer: Self-pay | Admitting: Gerontology

## 2022-08-02 VITALS — BP 133/80 | HR 67 | Resp 16 | Wt 267.0 lb

## 2022-08-02 DIAGNOSIS — Z0000 Encounter for general adult medical examination without abnormal findings: Secondary | ICD-10-CM

## 2022-08-02 DIAGNOSIS — I10 Essential (primary) hypertension: Secondary | ICD-10-CM

## 2022-08-02 DIAGNOSIS — R300 Dysuria: Secondary | ICD-10-CM

## 2022-08-02 DIAGNOSIS — J449 Chronic obstructive pulmonary disease, unspecified: Secondary | ICD-10-CM

## 2022-08-02 MED ORDER — CARVEDILOL 3.125 MG PO TABS
3.1250 mg | ORAL_TABLET | Freq: Two times a day (BID) | ORAL | 3 refills | Status: DC
Start: 2022-08-02 — End: 2023-07-18
  Filled 2022-08-02: qty 180, fill #0
  Filled 2022-08-27 – 2022-08-30 (×4): qty 180, 90d supply, fill #0
  Filled 2022-11-20: qty 180, 90d supply, fill #1
  Filled 2023-03-07: qty 180, 90d supply, fill #2
  Filled 2023-05-07: qty 180, 90d supply, fill #3

## 2022-08-02 MED ORDER — LOSARTAN POTASSIUM 50 MG PO TABS
50.0000 mg | ORAL_TABLET | Freq: Every day | ORAL | 3 refills | Status: DC
Start: 2022-08-02 — End: 2023-07-18
  Filled 2022-08-02: qty 90, fill #0
  Filled 2022-08-27 – 2022-08-28 (×2): qty 90, 90d supply, fill #0
  Filled 2022-11-20: qty 90, 90d supply, fill #1
  Filled 2022-11-20 – 2023-03-07 (×2): qty 90, 90d supply, fill #2
  Filled 2023-05-07: qty 90, 90d supply, fill #3

## 2022-08-02 MED ORDER — ALBUTEROL SULFATE HFA 108 (90 BASE) MCG/ACT IN AERS
1.0000 | INHALATION_SPRAY | Freq: Four times a day (QID) | RESPIRATORY_TRACT | 1 refills | Status: DC | PRN
  Filled 2022-08-02: qty 20.1, 75d supply, fill #0
  Filled 2022-11-20: qty 6.7, 50d supply, fill #0
  Filled 2023-03-07: qty 6.7, 25d supply, fill #1
  Filled 2023-05-07: qty 6.7, 25d supply, fill #2

## 2022-08-02 MED ORDER — SPIRONOLACTONE 25 MG PO TABS
25.0000 mg | ORAL_TABLET | Freq: Every day | ORAL | 3 refills | Status: DC
Start: 2022-08-02 — End: 2023-07-18
  Filled 2022-08-02 – 2022-08-28 (×3): qty 90, 90d supply, fill #0
  Filled 2022-11-20: qty 90, 90d supply, fill #1
  Filled 2023-03-07: qty 90, 90d supply, fill #2
  Filled 2023-05-07: qty 90, 90d supply, fill #3

## 2022-08-02 NOTE — Progress Notes (Signed)
Established Patient Office Visit  Subjective   Patient ID: Lindsey Gibson, female    DOB: June 25, 1960  Age: 62 y.o. MRN: [id number]  No chief complaint on file.   HPI  Lindsey Gibson is a 62 y/o female who has history of total thyroidectomy, anemia, anxiety, arthritis, asthma, CAD, COPD, hyperlipidemia, hypertension, ischemic cardiomyopathy, morbid obesity, myocardial infarction, who presents for follow-up visit and medication refill. She was seen by Endocrinologist for follow up of multinodular goiter s/p total thyroidectomy, will continue her Levothyroxine 100 mcg daily and will follow up om 07/19/23. She denies cold/heat intolerance, constipation and weight gain. She was also seen by the Cardiologist Dr Terrilee Files.A on 09/27/22 and Coronary artery disease involving native coronary arteries without angina: Continue aspirin indefinitely.    Chronic systolic heart failure: Ejection fraction improved to 50-55%.  No evidence of volume overload without any diuretics other than spironolactone.  Continue losartan and carvedilol. 3.  Hyperlipidemia: I reviewed most recent lipid profile which showed an LDL of 64.  Continue atorvastatin 80 mg daily.    Essential hypertension: Blood pressure is well controlled on current medications. She completed Bactrim for UTI, but continues to experience minimal dysuria and nocturia, but denies flank pain nor pelvic pain, fever and chills. Overall, she states that she's doing well and offers no further complaint.  Review of Systems  Constitutional: Negative.   Eyes: Negative.   Respiratory: Negative.    Cardiovascular: Negative.   Genitourinary:  Positive for dysuria and frequency. Negative for flank pain and urgency.  Neurological: Negative.   Endo/Heme/Allergies: Negative.   Psychiatric/Behavioral: Negative.        Objective:     BP 133/80 (BP Location: Left Arm, Patient Position: Sitting, Cuff Size: Large)   Pulse 67   Resp 16   Wt 267 lb (121.1 kg)    SpO2 96%   BMI 37.24 kg/m  BP Readings from Last 3 Encounters:  08/02/22 133/80  07/27/22 110/60  07/19/22 112/72   Wt Readings from Last 3 Encounters:  08/02/22 267 lb (121.1 kg)  07/27/22 263 lb 8 oz (119.5 kg)  07/19/22 264 lb (119.7 kg)      Physical Exam HENT:     Head: Normocephalic and atraumatic.  Eyes:     Pupils: Pupils are equal, round, and reactive to light.  Cardiovascular:     Rate and Rhythm: Normal rate and regular rhythm.     Pulses: Normal pulses.     Heart sounds: Normal heart sounds.  Pulmonary:     Effort: Pulmonary effort is normal.     Breath sounds: Normal breath sounds.  Skin:    General: Skin is warm.  Neurological:     General: No focal deficit present.     Mental Status: She is alert and oriented to person, place, and time. Mental status is at baseline.  Psychiatric:        Mood and Affect: Mood normal.        Behavior: Behavior normal.        Thought Content: Thought content normal.        Judgment: Judgment normal.      No results found for any visits on 08/02/22.  Last CBC Lab Results  Component Value Date   WBC 6.2 11/29/2021   HGB 11.9 11/29/2021   HCT 37.5 11/29/2021   MCV 85 11/29/2021   MCH 26.8 11/29/2021   RDW 14.7 11/29/2021   PLT 248 11/29/2021   Last metabolic panel Lab Results  Component Value Date   GLUCOSE 99 11/29/2021   NA 144 11/29/2021   K 4.4 11/29/2021   CL 105 11/29/2021   CO2 24 11/29/2021   BUN 12 11/29/2021   CREATININE 0.81 11/29/2021   EGFR 82 11/29/2021   CALCIUM 9.3 11/29/2021   PHOS 3.5 05/06/2021   PROT 6.9 11/29/2021   ALBUMIN 4.5 11/29/2021   LABGLOB 2.4 11/29/2021   AGRATIO 1.9 11/29/2021   BILITOT 0.9 11/29/2021   ALKPHOS 86 11/29/2021   AST 18 11/29/2021   ALT 18 11/29/2021   ANIONGAP 6 05/06/2021   Last lipids Lab Results  Component Value Date   CHOL 139 11/29/2021   HDL 44 11/29/2021   LDLCALC 64 11/29/2021   TRIG 185 (H) 11/29/2021   CHOLHDL 3.2 11/29/2021   Last  hemoglobin A1c Lab Results  Component Value Date   HGBA1C 5.8 (H) 06/14/2021   Last thyroid functions Lab Results  Component Value Date   TSH 3.770 07/11/2022   T3TOTAL 132 02/22/2021   T4TOTAL 7.3 06/14/2021   Last vitamin D No results found for: "25OHVITD2", "25OHVITD3", "VD25OH"    The ASCVD Risk score (Arnett DK, et al., 2019) failed to calculate for the following reasons:   The patient has a prior MI or stroke diagnosis    Assessment & Plan:   1. Chronic obstructive pulmonary disease, unspecified COPD type (HCC) - His breathing is stable and will continue on current medication. - albuterol (PROVENTIL HFA) 108 (90 Base) MCG/ACT inhaler; Inhale 1 puff into the lungs every 6 (six) hours as needed for wheezing or shortness of breath  Dispense: 20.1 g; Refill: 1 - spironolactone (ALDACTONE) 25 MG tablet; Take 1 tablet (25 mg total) by mouth daily.  Dispense: 90 tablet; Refill: 3 - Lipid panel; Future  2. Health care maintenance - Routine labs will be checked - Comp Met (CMET); Future - CBC w/Diff; Future - HgB A1c; Future  3. Hypertension, unspecified type - Her blood pressure is under control, will continue current medications, DASH diet and exercise as tolerated. - carvedilol (COREG) 3.125 MG tablet; Take 1 tablet by mouth 2 (two) times daily with a meal.  Dispense: 180 tablet; Refill: 3 - losartan (COZAAR) 50 MG tablet; TAKE ONE TABLET BY MOUTH ONCE EVERY DAY  Dispense: 90 tablet; Refill: 3  4. Dysuria - She's unable to provide urine sample during visit, but will bring sample next week. She was advised to go to the ED for worsening symptoms. - UA/M w/rflx Culture, Routine; Future - UA/M w/rflx Culture, Routine   Return in about 18 weeks (around 12/06/2022), or if symptoms worsen or fail to improve.    Loura Pitt Trellis Paganini, NP

## 2022-08-02 NOTE — Patient Instructions (Signed)

## 2022-08-16 ENCOUNTER — Other Ambulatory Visit: Payer: Self-pay

## 2022-08-27 ENCOUNTER — Other Ambulatory Visit: Payer: Self-pay

## 2022-08-28 ENCOUNTER — Other Ambulatory Visit: Payer: Self-pay

## 2022-08-28 ENCOUNTER — Other Ambulatory Visit (HOSPITAL_COMMUNITY): Payer: Self-pay

## 2022-08-29 ENCOUNTER — Other Ambulatory Visit: Payer: Self-pay

## 2022-08-29 ENCOUNTER — Other Ambulatory Visit (HOSPITAL_BASED_OUTPATIENT_CLINIC_OR_DEPARTMENT_OTHER): Payer: Self-pay

## 2022-08-30 ENCOUNTER — Other Ambulatory Visit: Payer: Self-pay

## 2022-08-30 ENCOUNTER — Other Ambulatory Visit (HOSPITAL_BASED_OUTPATIENT_CLINIC_OR_DEPARTMENT_OTHER): Payer: Self-pay

## 2022-09-07 ENCOUNTER — Other Ambulatory Visit (HOSPITAL_COMMUNITY): Payer: Self-pay

## 2022-11-20 ENCOUNTER — Other Ambulatory Visit: Payer: Self-pay

## 2022-11-20 ENCOUNTER — Other Ambulatory Visit: Payer: Self-pay | Admitting: Gerontology

## 2022-11-20 DIAGNOSIS — J449 Chronic obstructive pulmonary disease, unspecified: Secondary | ICD-10-CM

## 2022-11-20 MED ORDER — CETIRIZINE HCL 10 MG PO TABS
10.0000 mg | ORAL_TABLET | Freq: Every day | ORAL | 1 refills | Status: DC
Start: 2022-11-20 — End: 2023-05-07
  Filled 2022-11-20: qty 90, 90d supply, fill #0
  Filled 2023-03-07 (×2): qty 90, 90d supply, fill #1

## 2022-11-21 ENCOUNTER — Other Ambulatory Visit: Payer: Self-pay

## 2022-11-21 DIAGNOSIS — Z0000 Encounter for general adult medical examination without abnormal findings: Secondary | ICD-10-CM

## 2022-11-21 DIAGNOSIS — J449 Chronic obstructive pulmonary disease, unspecified: Secondary | ICD-10-CM

## 2022-11-22 LAB — CBC WITH DIFFERENTIAL/PLATELET
Basophils Absolute: 0 10*3/uL (ref 0.0–0.2)
Basos: 0 %
EOS (ABSOLUTE): 0.2 10*3/uL (ref 0.0–0.4)
Eos: 3 %
Hematocrit: 37.2 % (ref 34.0–46.6)
Hemoglobin: 11.9 g/dL (ref 11.1–15.9)
Immature Grans (Abs): 0 10*3/uL (ref 0.0–0.1)
Immature Granulocytes: 0 %
Lymphocytes Absolute: 2.5 10*3/uL (ref 0.7–3.1)
Lymphs: 40 %
MCH: 26.8 pg (ref 26.6–33.0)
MCHC: 32 g/dL (ref 31.5–35.7)
MCV: 84 fL (ref 79–97)
Monocytes Absolute: 0.4 10*3/uL (ref 0.1–0.9)
Monocytes: 6 %
Neutrophils Absolute: 3.2 10*3/uL (ref 1.4–7.0)
Neutrophils: 51 %
Platelets: 240 10*3/uL (ref 150–450)
RBC: 4.44 x10E6/uL (ref 3.77–5.28)
RDW: 14.6 % (ref 11.7–15.4)
WBC: 6.3 10*3/uL (ref 3.4–10.8)

## 2022-11-22 LAB — COMPREHENSIVE METABOLIC PANEL
ALT: 25 [IU]/L (ref 0–32)
AST: 23 [IU]/L (ref 0–40)
Albumin: 4.3 g/dL (ref 3.9–4.9)
Alkaline Phosphatase: 90 [IU]/L (ref 44–121)
BUN/Creatinine Ratio: 13 (ref 12–28)
BUN: 12 mg/dL (ref 8–27)
Bilirubin Total: 1.5 mg/dL — ABNORMAL HIGH (ref 0.0–1.2)
CO2: 22 mmol/L (ref 20–29)
Calcium: 9.4 mg/dL (ref 8.7–10.3)
Chloride: 108 mmol/L — ABNORMAL HIGH (ref 96–106)
Creatinine, Ser: 0.93 mg/dL (ref 0.57–1.00)
Globulin, Total: 2.5 g/dL (ref 1.5–4.5)
Glucose: 96 mg/dL (ref 70–99)
Potassium: 4.5 mmol/L (ref 3.5–5.2)
Sodium: 143 mmol/L (ref 134–144)
Total Protein: 6.8 g/dL (ref 6.0–8.5)
eGFR: 69 mL/min/{1.73_m2} (ref >59)

## 2022-11-22 LAB — HEMOGLOBIN A1C
Est. average glucose Bld gHb Est-mCnc: 128 mg/dL
Hgb A1c MFr Bld: 6.1 % — ABNORMAL HIGH (ref 4.8–5.6)

## 2022-11-22 LAB — LIPID PANEL
Chol/HDL Ratio: 3.6 ratio (ref 0.0–4.4)
Cholesterol, Total: 136 mg/dL (ref 100–199)
HDL: 38 mg/dL — ABNORMAL LOW (ref >39)
LDL Chol Calc (NIH): 62 mg/dL (ref 0–99)
Triglycerides: 224 mg/dL — ABNORMAL HIGH (ref 0–149)
VLDL Cholesterol Cal: 36 mg/dL (ref 5–40)

## 2022-11-28 ENCOUNTER — Other Ambulatory Visit: Payer: Self-pay

## 2022-12-05 ENCOUNTER — Other Ambulatory Visit: Payer: Self-pay

## 2022-12-06 ENCOUNTER — Ambulatory Visit: Payer: Self-pay | Admitting: Gerontology

## 2022-12-06 DIAGNOSIS — R7303 Prediabetes: Secondary | ICD-10-CM

## 2022-12-06 DIAGNOSIS — Z0000 Encounter for general adult medical examination without abnormal findings: Secondary | ICD-10-CM

## 2022-12-06 DIAGNOSIS — E785 Hyperlipidemia, unspecified: Secondary | ICD-10-CM | POA: Insufficient documentation

## 2022-12-06 NOTE — Progress Notes (Signed)
Established Patient Office Visit  Subjective   Patient ID: Lindsey Gibson, female    DOB: June 23, 1960  Age: 62 y.o. MRN: [id number]  No chief complaint on file. Patient consented to telephone visit and 2 patient identifier was used to identify patient.  HPI  Lindsey Gibson is a 62 y/o female who has history of total thyroidectomy, anemia, anxiety, arthritis, asthma, CAD, COPD, hyperlipidemia, hypertension, ischemic cardiomyopathy, morbid obesity, myocardial infarction, who presents for follow-up visit , lab review and medication refill. She states that she's complaint with her medications, denies side effects and continues to make healthy lifestyle changes. Her labs checked on 11/21/22 were unremarkable except HgbA1c was 6.1%,Triglycerides was 224 mg/dl and HDL was 38 mg/dl. She deferred having colonoscopy, mammogram and Papsmear. Overall, she states that she's doing well and offers no further complaint.  Review of Systems  Respiratory: Negative.    Cardiovascular: Negative.   Genitourinary: Negative.   Neurological: Negative.   Psychiatric/Behavioral: Negative.        Objective:     There were no vitals taken for this visit. BP Readings from Last 3 Encounters:  08/02/22 133/80  07/27/22 110/60  07/19/22 112/72   Wt Readings from Last 3 Encounters:  08/02/22 267 lb (121.1 kg)  07/27/22 263 lb 8 oz (119.5 kg)  07/19/22 264 lb (119.7 kg)      Physical Exam   No results found for any visits on 12/06/22.  Last CBC Lab Results  Component Value Date   WBC 6.3 11/21/2022   HGB 11.9 11/21/2022   HCT 37.2 11/21/2022   MCV 84 11/21/2022   MCH 26.8 11/21/2022   RDW 14.6 11/21/2022   PLT 240 11/21/2022   Last metabolic panel Lab Results  Component Value Date   GLUCOSE 96 11/21/2022   NA 143 11/21/2022   K 4.5 11/21/2022   CL 108 (H) 11/21/2022   CO2 22 11/21/2022   BUN 12 11/21/2022   CREATININE 0.93 11/21/2022   EGFR 69 11/21/2022   CALCIUM 9.4 11/21/2022    PHOS 3.5 05/06/2021   PROT 6.8 11/21/2022   ALBUMIN 4.3 11/21/2022   LABGLOB 2.5 11/21/2022   AGRATIO 1.9 11/29/2021   BILITOT 1.5 (H) 11/21/2022   ALKPHOS 90 11/21/2022   AST 23 11/21/2022   ALT 25 11/21/2022   ANIONGAP 6 05/06/2021   Last lipids Lab Results  Component Value Date   CHOL 136 11/21/2022   HDL 38 (L) 11/21/2022   LDLCALC 62 11/21/2022   TRIG 224 (H) 11/21/2022   CHOLHDL 3.6 11/21/2022   Last hemoglobin A1c Lab Results  Component Value Date   HGBA1C 6.1 (H) 11/21/2022   Last thyroid functions Lab Results  Component Value Date   TSH 3.770 07/11/2022   T3TOTAL 132 02/22/2021   T4TOTAL 7.3 06/14/2021   Last vitamin D No results found for: "25OHVITD2", "25OHVITD3", "VD25OH" Last vitamin B12 and Folate No results found for: "VITAMINB12", "FOLATE"    The ASCVD Risk score (Arnett DK, et al., 2019) failed to calculate for the following reasons:   The patient has a prior MI or stroke diagnosis    Assessment & Plan:   1. Prediabetes - Her HgbA1c was 6.1%, she declines metformin therapy, was encouraged to continue on low carb/non concentrated sweet diet and exercise as tolerated.  2. Health care maintenance - She was educated on the importance of having colonoscopy, mammogram and Papsmear done, she declined, stating that she will do fecal occult card when she comes in for  her lab in 6 months.  3. Elevated lipids - She will continue on current medication, low fat /cholesterol diet and exercise as tolerated.   Return in about 7 months (around 07/17/2023), or if symptoms worsen or fail to improve.    Ayesha Markwell Trellis Paganini, NP

## 2023-03-07 ENCOUNTER — Other Ambulatory Visit: Payer: Self-pay

## 2023-05-07 ENCOUNTER — Other Ambulatory Visit: Payer: Self-pay | Admitting: Gerontology

## 2023-05-07 ENCOUNTER — Other Ambulatory Visit: Payer: Self-pay

## 2023-05-07 DIAGNOSIS — J449 Chronic obstructive pulmonary disease, unspecified: Secondary | ICD-10-CM

## 2023-05-07 DIAGNOSIS — E785 Hyperlipidemia, unspecified: Secondary | ICD-10-CM

## 2023-05-07 MED FILL — Cetirizine HCl Tab 10 MG: ORAL | 90 days supply | Qty: 90 | Fill #0 | Status: CN

## 2023-05-07 MED FILL — Atorvastatin Calcium Tab 80 MG (Base Equivalent): ORAL | 90 days supply | Qty: 90 | Fill #0 | Status: CN

## 2023-05-08 ENCOUNTER — Other Ambulatory Visit: Payer: Self-pay

## 2023-05-09 ENCOUNTER — Other Ambulatory Visit: Payer: Self-pay

## 2023-05-10 ENCOUNTER — Other Ambulatory Visit: Payer: Self-pay

## 2023-06-04 ENCOUNTER — Other Ambulatory Visit: Payer: Self-pay

## 2023-06-04 ENCOUNTER — Telehealth: Payer: Self-pay | Admitting: Physician Assistant

## 2023-06-04 DIAGNOSIS — R3989 Other symptoms and signs involving the genitourinary system: Secondary | ICD-10-CM

## 2023-06-04 MED ORDER — SULFAMETHOXAZOLE-TRIMETHOPRIM 800-160 MG PO TABS
1.0000 | ORAL_TABLET | Freq: Two times a day (BID) | ORAL | 0 refills | Status: DC
  Filled 2023-06-04: qty 10, 5d supply, fill #0

## 2023-06-04 MED FILL — Atorvastatin Calcium Tab 80 MG (Base Equivalent): ORAL | 90 days supply | Qty: 90 | Fill #0 | Status: AC

## 2023-06-04 MED FILL — Cetirizine HCl Tab 10 MG: ORAL | 90 days supply | Qty: 90 | Fill #0 | Status: AC

## 2023-06-04 NOTE — Progress Notes (Signed)
 E-Visit for Urinary Problems  We are sorry that you are not feeling well.  Here is how we plan to help!  Based on what you shared with me it looks like you most likely have a simple urinary tract infection.  A UTI (Urinary Tract Infection) is a bacterial infection of the bladder.  Most cases of urinary tract infections are simple to treat but a key part of your care is to encourage you to drink plenty of fluids and watch your symptoms carefully.  I have prescribed Bactrim DS One tablet twice a day for 5 days.  Your symptoms should gradually improve. Call us if the burning in your urine worsens, you develop worsening fever, back pain or pelvic pain or if your symptoms do not resolve after completing the antibiotic.  Urinary tract infections can be prevented by drinking plenty of water to keep your body hydrated.  Also be sure when you wipe, wipe from front to back and don't hold it in!  If possible, empty your bladder every 4 hours.  HOME CARE Drink plenty of fluids Compete the full course of the antibiotics even if the symptoms resolve Remember, when you need to go.go. Holding in your urine can increase the likelihood of getting a UTI! GET HELP RIGHT AWAY IF: You cannot urinate You get a high fever Worsening back pain occurs You see blood in your urine You feel sick to your stomach or throw up You feel like you are going to pass out  MAKE SURE YOU  Understand these instructions. Will watch your condition. Will get help right away if you are not doing well or get worse.   Thank you for choosing an e-visit.  Your e-visit answers were reviewed by a board certified advanced clinical practitioner to complete your personal care plan. Depending upon the condition, your plan could have included both over the counter or prescription medications.  Please review your pharmacy choice. Make sure the pharmacy is open so you can pick up prescription now. If there is a problem, you may contact  your provider through [employer] and have the prescription routed to another pharmacy.  Your safety is important to [place]. If you have drug allergies check your prescription carefully.   For the next 24 hours you can use MyChart to ask questions about today's visit, request a non-urgent call back, or ask for a work or school excuse. You will get an email in the next two days asking about your experience. I hope that your e-visit has been valuable and will speed your recovery.    I have spent 5 minutes in review of e-visit questionnaire, review and updating patient chart, medical decision making and response to patient.   Margaretann Loveless, PA-C

## 2023-06-05 ENCOUNTER — Other Ambulatory Visit: Payer: Self-pay

## 2023-07-02 ENCOUNTER — Telehealth: Payer: Self-pay | Admitting: Gerontology

## 2023-07-02 NOTE — Telephone Encounter (Signed)
 Patient was called due to an E visit appointment on 06/04/23 for possible UTI, she reports completing the dose of Bactrim  bid x 5 days. States that she states that it has not cleared completely and she will follow up with her appointment on 07/12/23. She was advised to notify clinic if appointment is needed sooner or her symptoms worsen. She verbalized understanding.

## 2023-07-08 ENCOUNTER — Other Ambulatory Visit: Payer: Self-pay | Admitting: *Deleted

## 2023-07-08 DIAGNOSIS — E042 Nontoxic multinodular goiter: Secondary | ICD-10-CM

## 2023-07-08 DIAGNOSIS — E890 Postprocedural hypothyroidism: Secondary | ICD-10-CM

## 2023-07-08 DIAGNOSIS — Z9889 Other specified postprocedural states: Secondary | ICD-10-CM

## 2023-07-16 ENCOUNTER — Other Ambulatory Visit: Payer: Self-pay

## 2023-07-16 ENCOUNTER — Ambulatory Visit: Payer: Self-pay | Admitting: Gerontology

## 2023-07-16 DIAGNOSIS — Z0000 Encounter for general adult medical examination without abnormal findings: Secondary | ICD-10-CM

## 2023-07-16 NOTE — Patient Instructions (Incomplete)

## 2023-07-16 NOTE — Addendum Note (Signed)
 Addended by: LEVONNE LEITA RAMAN on: 07/16/2023 10:34 AM   Modules accepted: Orders

## 2023-07-17 ENCOUNTER — Ambulatory Visit: Payer: Self-pay | Admitting: Nurse Practitioner

## 2023-07-17 ENCOUNTER — Ambulatory Visit: Payer: Self-pay | Admitting: Gerontology

## 2023-07-17 DIAGNOSIS — E890 Postprocedural hypothyroidism: Secondary | ICD-10-CM

## 2023-07-18 ENCOUNTER — Ambulatory Visit: Payer: Self-pay | Admitting: Gerontology

## 2023-07-18 ENCOUNTER — Other Ambulatory Visit: Payer: Self-pay

## 2023-07-18 ENCOUNTER — Encounter: Payer: Self-pay | Admitting: Nurse Practitioner

## 2023-07-18 ENCOUNTER — Ambulatory Visit (INDEPENDENT_AMBULATORY_CARE_PROVIDER_SITE_OTHER): Payer: Self-pay | Admitting: Nurse Practitioner

## 2023-07-18 DIAGNOSIS — Z0000 Encounter for general adult medical examination without abnormal findings: Secondary | ICD-10-CM

## 2023-07-18 DIAGNOSIS — J449 Chronic obstructive pulmonary disease, unspecified: Secondary | ICD-10-CM

## 2023-07-18 DIAGNOSIS — E890 Postprocedural hypothyroidism: Secondary | ICD-10-CM

## 2023-07-18 DIAGNOSIS — R7303 Prediabetes: Secondary | ICD-10-CM

## 2023-07-18 DIAGNOSIS — I10 Essential (primary) hypertension: Secondary | ICD-10-CM

## 2023-07-18 DIAGNOSIS — N390 Urinary tract infection, site not specified: Secondary | ICD-10-CM

## 2023-07-18 DIAGNOSIS — E785 Hyperlipidemia, unspecified: Secondary | ICD-10-CM

## 2023-07-18 MED ORDER — LEVOTHYROXINE SODIUM 100 MCG PO TABS
100.0000 ug | ORAL_TABLET | Freq: Every day | ORAL | 3 refills | Status: DC
  Filled 2023-07-18: qty 90, 90d supply, fill #0

## 2023-07-18 MED ORDER — SPIRONOLACTONE 25 MG PO TABS
25.0000 mg | ORAL_TABLET | Freq: Every day | ORAL | 3 refills | Status: AC
  Filled 2023-07-18 – 2023-08-29 (×2): qty 90, 90d supply, fill #0
  Filled 2023-11-11 (×2): qty 90, 90d supply, fill #1
  Filled 2024-01-26: qty 90, 90d supply, fill #2

## 2023-07-18 MED ORDER — CARVEDILOL 3.125 MG PO TABS
3.1250 mg | ORAL_TABLET | Freq: Two times a day (BID) | ORAL | 3 refills | Status: AC
  Filled 2023-07-18 – 2023-08-29 (×2): qty 180, 90d supply, fill #0
  Filled 2023-11-11 (×2): qty 180, 90d supply, fill #1
  Filled 2024-01-26: qty 180, 90d supply, fill #2

## 2023-07-18 MED ORDER — LOSARTAN POTASSIUM 50 MG PO TABS
50.0000 mg | ORAL_TABLET | Freq: Every day | ORAL | 3 refills | Status: AC
  Filled 2023-07-18 – 2023-08-29 (×2): qty 90, 90d supply, fill #0
  Filled 2023-11-11 (×2): qty 90, 90d supply, fill #1
  Filled 2023-12-30: qty 90, 90d supply, fill #2
  Filled 2024-01-26 – 2024-01-28 (×2): qty 90, 90d supply, fill #3

## 2023-07-18 MED ORDER — ATORVASTATIN CALCIUM 20 MG PO TABS
80.0000 mg | ORAL_TABLET | Freq: Every day | ORAL | 2 refills | Status: DC
  Filled 2023-07-18: qty 360, 90d supply, fill #0
  Filled 2023-08-29: qty 360, 90d supply, fill #1

## 2023-07-18 MED ORDER — LEVOTHYROXINE SODIUM 112 MCG PO TABS
112.0000 ug | ORAL_TABLET | Freq: Every day | ORAL | 3 refills | Status: AC
  Filled 2023-07-18: qty 90, 90d supply, fill #0
  Filled 2023-09-28: qty 90, 90d supply, fill #1
  Filled 2023-09-28: qty 30, 30d supply, fill #1
  Filled 2023-11-11 (×2): qty 30, 30d supply, fill #2
  Filled 2023-12-30: qty 30, 30d supply, fill #3
  Filled 2024-01-26: qty 30, 30d supply, fill #4

## 2023-07-18 MED ORDER — NITROFURANTOIN MONOHYD MACRO 100 MG PO CAPS
100.0000 mg | ORAL_CAPSULE | Freq: Two times a day (BID) | ORAL | 0 refills | Status: DC
  Filled 2023-07-18: qty 10, 5d supply, fill #0

## 2023-07-18 NOTE — Patient Instructions (Signed)

## 2023-07-18 NOTE — Progress Notes (Signed)
 Established Patient Office Visit  Subjective   Patient ID: Lindsey Gibson, female    DOB: 1960-06-22  Age: 63 y.o. MRN: [id number]  No chief complaint on file.  Patient consents to  Telephone vii  HPI  Lindsey Gibson is a 63 y/o female who has history of total thyroidectomy, anemia, anxiety, arthritis, asthma, CAD, COPD, hyperlipidemia, hypertension, ischemic cardiomyopathy, morbid obesity, myocardial infarction, who presents for follow-up visit and lab review. She states that she's compliant with her medications, denies side effects and continues to make healthy lifestyle changes. Her Labs done on 07/16/23, HgbA1c was 6.1% and she continues to decline taking metformin. Urinalysis checked had 3+ Leukocytes, 1+ RBC, cloudy and positive Nitrite. She was treated for UTI in June with Bactrim , she endorses urinary urgency, feeling of not emptying her bladder, but denies dysuria, urinary frequency, fever, chills, pelvic and flank pain. The rest of her lab was unremarkable. Overall, she states that she's doing well and offers no further complaint.    Review of Systems  Constitutional: Negative.   Eyes: Negative.   Respiratory: Negative.    Cardiovascular: Negative.   Genitourinary:  Positive for urgency. Negative for dysuria, flank pain, frequency and hematuria.  Neurological: Negative.   Psychiatric/Behavioral: Negative.        Objective:     There were no vitals taken for this visit. BP Readings from Last 3 Encounters:  08/02/22 133/80  07/27/22 110/60  07/19/22 112/72   Wt Readings from Last 3 Encounters:  08/02/22 267 lb (121.1 kg)  07/27/22 263 lb 8 oz (119.5 kg)  07/19/22 264 lb (119.7 kg)      Physical Exam   No results found for any visits on 07/18/23.  Last CBC Lab Results  Component Value Date   WBC 6.3 11/21/2022   HGB 11.9 11/21/2022   HCT 37.2 11/21/2022   MCV 84 11/21/2022   MCH 26.8 11/21/2022   RDW 14.6 11/21/2022   PLT 240 11/21/2022   Last  metabolic panel Lab Results  Component Value Date   GLUCOSE 96 11/21/2022   NA 143 11/21/2022   K 4.5 11/21/2022   CL 108 (H) 11/21/2022   CO2 22 11/21/2022   BUN 12 11/21/2022   CREATININE 0.93 11/21/2022   EGFR 69 11/21/2022   CALCIUM  9.4 11/21/2022   PHOS 3.5 05/06/2021   PROT 6.8 11/21/2022   ALBUMIN 4.3 11/21/2022   LABGLOB 2.5 11/21/2022   AGRATIO 1.9 11/29/2021   BILITOT 1.5 (H) 11/21/2022   ALKPHOS 90 11/21/2022   AST 23 11/21/2022   ALT 25 11/21/2022   ANIONGAP 6 05/06/2021   Last lipids Lab Results  Component Value Date   CHOL 136 11/21/2022   HDL 38 (L) 11/21/2022   LDLCALC 62 11/21/2022   TRIG 224 (H) 11/21/2022   CHOLHDL 3.6 11/21/2022   Last hemoglobin A1c Lab Results  Component Value Date   HGBA1C 6.1 (H) 07/16/2023   Last thyroid  functions Lab Results  Component Value Date   TSH 3.920 07/16/2023   T3TOTAL 132 02/22/2021   T4TOTAL 7.3 06/14/2021   Last vitamin D No results found for: 25OHVITD2, 25OHVITD3, VD25OH Last vitamin B12 and Folate No results found for: VITAMINB12, FOLATE    The ASCVD Risk score (Arnett DK, et al., 2019) failed to calculate for the following reasons:   Risk score cannot be calculated because patient has a medical history suggesting prior/existing ASCVD    Assessment & Plan:    1. Postoperative hypothyroidism - Her TSH  is euthyroid, she will continue current medication. - levothyroxine  (SYNTHROID ) 100 MCG tablet; Take 1 tablet (100 mcg total) by mouth daily before breakfast.  Dispense: 90 tablet; Refill: 3  2. Hypertension, unspecified type - She will continue current medication, will check blood pressure during her lab visit, encouraged to continue on DASH diet and exercise as tolerated. - losartan  (COZAAR ) 50 MG tablet; Take 1 tablet (50 mg total) by mouth daily.  Dispense: 90 tablet; Refill: 3 - carvedilol  (COREG ) 3.125 MG tablet; Take 1 tablet (3.125 mg total) by mouth 2 (two) times daily with a meal.   Dispense: 180 tablet; Refill: 3  3. Chronic obstructive pulmonary disease, unspecified COPD type (HCC) - Will continue current medication. - spironolactone  (ALDACTONE ) 25 MG tablet; Take 1 tablet (25 mg total) by mouth daily.  Dispense: 90 tablet; Refill: 3  4. Elevated lipids - She will continue current medication, low fat/cholesterol diet and exercise as tolerated. - atorvastatin  (LIPITOR) 20 MG tablet; Take 4 tablets (80 mg total) by mouth daily.  Dispense: 360 tablet; Refill: 2  5. Urinary tract infection with hematuria, site unspecified (Primary) - Recurrent UTI, she was started on Macrobid , educated on medication side effects and advised to notify clinic. Urine culture has not resulted. She was advised to increase water intake and proper perineal hygiene. - nitrofurantoin , macrocrystal-monohydrate, (MACROBID ) 100 MG capsule; Take 1 capsule (100 mg total) by mouth 2 (two) times daily.  Dispense: 10 capsule; Refill: 0 - UA/M w/rflx Culture, Routine; Future  6. Health care maintenance - Routine labs will be checked - Comp Met (CMET); Future - CBC w/Diff; Future - Lipid panel; Future - Hemoglobin A1c; Future  7. Prediabetes - Her HgbA1c was 6.1%, she declines taking Metformin, was advised to continue on low carb/non concentrated sweet diet and exercise as tolerated.   Return in about 18 weeks (around 11/21/2023), or if symptoms worsen or fail to improve, for Lab 11/12/23 and telephone visit 11/21/23 @ 9 am.    Makynzi Eastland E Yaseen Gilberg, NP

## 2023-07-18 NOTE — Progress Notes (Signed)
 Endocrinology Follow Up Note 07/18/23     ---------------------------------------------------------------------------------------------------------------------- Subjective    Past Medical History:  Diagnosis Date   Anemia    Anxiety    Arthritis    Asthma    CAD (coronary artery disease)    a. 12/2016 NSTEMI/PCI: LM nl, LAD 30ost, 20m (IVUS->severe plaque burden->3.0 x 18 Sierra DES), D1/2/3 nl, LCX 30p, OM1/2/3 nl, RCA nl, RPDA/RPAV/RPL1/2 nl.   COPD (chronic obstructive pulmonary disease) (HCC)    Hyperlipidemia    Hypertension    Ischemic cardiomyopathy    a. 12/2016 Echo: EF 20-25%, mid-apicalanteroseptal, ant, and apical AK, GR1 DD, triv AI, mildly dil LA; b. 04/2017 Echo: EF 50-55%, Gr2 DD, Nl RV fxn.   Morbid obesity (HCC)    Myocardial infarction (HCC) 2018    Past Surgical History:  Procedure Laterality Date   CORONARY STENT INTERVENTION N/A 12/27/2016   Procedure: CORONARY STENT INTERVENTION;  Surgeon: Darron Deatrice LABOR, MD;  Location: [facility];  Service: Cardiovascular;  Laterality: N/A;   CORONARY ULTRASOUND/IVUS N/A 12/27/2016   Procedure: Intravascular Ultrasound/IVUS;  Surgeon: Darron Deatrice LABOR, MD;  Location: [facility];  Service: Cardiovascular;  Laterality: N/A;   KNEE ARTHROSCOPY Right    LEFT HEART CATH AND CORONARY ANGIOGRAPHY N/A 12/27/2016   Procedure: LEFT HEART CATH AND CORONARY ANGIOGRAPHY;  Surgeon: Darron Deatrice LABOR, MD;  Location: [facility];  Service: Cardiovascular;  Laterality: N/A;   SHOULDER ARTHROSCOPY WITH SUBACROMIAL DECOMPRESSION Right 11/22/2015   Procedure: SHOULDER ARTHROSCOPY WITH DEBRIDEMENT, DECOMPRESSION  AND BICEPS TENODESIS;  Surgeon: Norleen JINNY Maltos, MD;  Location: [facility];  Service: Orthopedics;  Laterality: Right;   THYROIDECTOMY N/A 05/05/2021   Procedure: THYROIDECTOMY, TOTAL;  Surgeon: Mavis Anes, MD;  Location: [facility];  Service: General;  Laterality: N/A;   TONSILLECTOMY     ULNAR NERVE  TRANSPOSITION Right 11/22/2015   Procedure: ULNAR NERVE DECOMPRESSION/TRANSPOSITION;  Surgeon: Norleen JINNY Maltos, MD;  Location: [facility];  Service: Orthopedics;  Laterality: Right;    Social History   Socioeconomic History   Marital status: Married    Spouse name: Not on file   Number of children: Not on file   Years of education: Not on file   Highest education level: Not on file  Occupational History   Occupation: none  Tobacco Use   Smoking status: Former    Current packs/day: 0.00    Average packs/day: 1 pack/day for 18.0 years (18.0 ttl pk-yrs)    Types: Cigarettes    Start date: 20    Quit date: 52    Years since quitting: 27.5   Smokeless tobacco: Never  Vaping Use   Vaping status: Never Used  Substance and Sexual Activity   Alcohol use: No   Drug use: No   Sexual activity: Not on file  Other Topics Concern   Not on file  Social History Narrative   Rents with family. Not on food stamps.   Social Drivers of Corporate investment banker Strain: Low Risk  (12/07/2021)   Overall Financial Resource Strain (CARDIA)    Difficulty of Paying Living Expenses: Not hard at all  Food Insecurity: No Food Insecurity (06/21/2021)   Hunger Vital Sign    Worried About Running Out of Food in the Last Year: Never true    Ran Out of Food in the Last Year: Never true  Transportation Needs: No Transportation Needs (06/21/2021)   PRAPARE - Administrator, Civil Service (Medical): No  Lack of Transportation (Non-Medical): No  Physical Activity: Sufficiently Active (12/07/2021)   Exercise Vital Sign    Days of Exercise per Week: 7 days    Minutes of Exercise per Session: 60 min  Stress: No Stress Concern Present (12/07/2021)   [employer] of Occupational Health - Occupational Stress Questionnaire    Feeling of Stress : Not at all  Social Connections: Moderately Isolated (12/07/2021)   Social Connection and Isolation Panel    Frequency of Communication with Friends and  Family: More than three times a week    Frequency of Social Gatherings with Friends and Family: More than three times a week    Attends Religious Services: Never    Database administrator or Organizations: No    Attends Banker Meetings: Never    Marital Status: Married  Catering manager Violence: Not At Risk (12/07/2021)   Humiliation, Afraid, Rape, and Kick questionnaire    Fear of Current or Ex-Partner: No    Emotionally Abused: No    Physically Abused: No    Sexually Abused: No    Current Outpatient Medications on File Prior to Visit  Medication Sig Dispense Refill   albuterol  (PROVENTIL  HFA) 108 (90 Base) MCG/ACT inhaler Inhale 1 puff into the lungs every 6 (six) hours as needed for wheezing or shortness of breath 20.1 g 1   aspirin  EC 81 MG tablet Take 81 mg by mouth daily. Swallow whole.     atorvastatin  (LIPITOR) 20 MG tablet Take 4 tablets (80 mg total) by mouth daily. 360 tablet 2   carvedilol  (COREG ) 3.125 MG tablet Take 1 tablet (3.125 mg total) by mouth 2 (two) times daily with a meal. 180 tablet 3   cetirizine  (ZYRTEC ) 10 MG tablet Take 1 tablet (10 mg total) by mouth daily. 90 tablet 1   ibuprofen  (ADVIL ) 200 MG tablet Take 2 tablets (400 mg total) by mouth every 6 (six) hours as needed for mild pain. 30 tablet 0   losartan  (COZAAR ) 50 MG tablet Take 1 tablet (50 mg total) by mouth daily. 90 tablet 3   Multiple Vitamin (MULTIVITAMIN WITH MINERALS) TABS tablet Take 1 tablet by mouth daily.     nitrofurantoin , macrocrystal-monohydrate, (MACROBID ) 100 MG capsule Take 1 capsule (100 mg total) by mouth 2 (two) times daily. 10 capsule 0   spironolactone  (ALDACTONE ) 25 MG tablet Take 1 tablet (25 mg total) by mouth daily. 90 tablet 3   No current facility-administered medications on file prior to visit.      HPI   Lindsey Gibson is a 63 y.o.-year-old female, referred by her PCP, Dr.Chaplin, for evaluation for multinodular goiter and suppressed TSH (suggestive of  hyperthyroidism).  She had total thyroidectomy on 05/05/21 and she was subsequently started on thyroid  hormone at 100 mcg po daily before breakfast.  Thyroid  U/S: shows enlarged multinodular thyroid  with 4+ moderately suspicious nodules recommending biopsy.  She also had uptake and scan done which was WNL.   I reviewed pt's thyroid  tests: Lab Results  Component Value Date   TSH 3.920 07/16/2023   TSH 3.770 07/11/2022   TSH 1.740 01/17/2022   TSH 1.920 09/13/2021   TSH 2.030 07/12/2021   TSH 6.130 (H) 06/14/2021   TSH 0.130 (L) 05/10/2021   TSH 0.377 05/03/2021   TSH 0.290 (L) 02/22/2021   TSH 0.280 (L) 01/04/2021   FREET4 1.13 07/16/2023   FREET4 1.28 07/11/2022   FREET4 1.30 01/17/2022   FREET4 1.33 09/13/2021   FREET4 1.03  06/14/2021   FREET4 1.78 (H) 05/10/2021    No FH of thyroid  ds. No FH of thyroid  cancer. No h/o radiation tx to head or neck.  No seaweed or kelp. No recent contrast studies. No steroid use. No herbal supplements. No Biotin supplements or Hair, Skin and Nails vitamins.  Pt also has a history of HTN, COPD, MI.  She does report multiple UTIs since she had her thyroid  removed.  She is wondering if there is any association with that.  Review of systems  Constitutional: + Minimally fluctuating body weight,  current Body mass index is 37.18 kg/m. , no fatigue, no subjective hyperthermia, no subjective hypothermia Eyes: no blurry vision, no xerophthalmia ENT: no sore throat, no nodules palpated in throat, no dysphagia/odynophagia, no hoarseness Cardiovascular: no chest pain, no shortness of breath, no palpitations, no leg swelling Respiratory: no cough, no shortness of breath Gastrointestinal: no nausea/vomiting/diarrhea Musculoskeletal: no muscle/joint aches Skin: no rashes, no hyperemia Neurological: no tremors, no numbness, no tingling, no dizziness Psychiatric: no depression, no  anxiety  ---------------------------------------------------------------------------------------------------------------------- Objective    BP 128/78 (BP Location: Left Arm, Patient Position: Sitting, Cuff Size: Large)   Pulse 70   Ht 5' 11 (1.803 m)   Wt 266 lb 9.6 oz (120.9 kg)   BMI 37.18 kg/m    BP Readings from Last 3 Encounters:  07/18/23 128/78  08/02/22 133/80  07/27/22 110/60    Wt Readings from Last 3 Encounters:  07/18/23 266 lb 9.6 oz (120.9 kg)  08/02/22 267 lb (121.1 kg)  07/27/22 263 lb 8 oz (119.5 kg)      Physical Exam- Limited  Constitutional:  Body mass index is 37.18 kg/m. , not in acute distress, normal state of mind Eyes:  EOMI, no exophthalmos Musculoskeletal: no gross deformities, strength intact in all four extremities, no gross restriction of joint movements Skin:  no rashes, no hyperemia Neurological: no tremor with outstretched hands      ------------------------------------------------------------------------------------------------------------------------ Uptake and Scan from 02/17/21 CLINICAL DATA:  Suppressed TSH, weight loss, increased nervousness   EXAM: THYROID  SCAN AND UPTAKE - 4 AND 24 HOURS   TECHNIQUE: Following oral administration of I-123 capsule, anterior planar imaging was acquired at 24 hours. Thyroid  uptake was calculated with a thyroid  probe at 4-6 hours and 24 hours.   RADIOPHARMACEUTICALS:  293.2 uCi I-123 sodium iodide p.o.   COMPARISON:  None   FINDINGS: Thyroid  lobes appear enlarged bilaterally.   Both lobes demonstrate large cold masses involving the mid to inferior lobes.   4 hour I-123 uptake = 5.5% (normal 5-20%)   24 hour I-123 uptake = 18.4% (normal 10-30%)   IMPRESSION: Normal 4 hour and 24 hour radio iodine uptakes.   Large cold masses within the mid to inferior thyroid  lobes bilaterally; thyroid  ultrasound assessment recommended to exclude neoplasm.     Electronically Signed   By:  Oneil Kiss M.D.   On: 02/17/2021 10:35  ---------------------------------------------------------------------------------------------------------------------------------- Thyroid  US  from 03/07/21 CLINICAL DATA:  Prior ultrasound follow-up.   EXAM: THYROID  ULTRASOUND   TECHNIQUE: Ultrasound examination of the thyroid  gland and adjacent soft tissues was performed.   COMPARISON:  Nuclear medicine thyroid  scan February 2023   FINDINGS: Parenchymal Echotexture: Thyroid  tissue largely replaced by numerous masses   Isthmus: 0.5 cm   Right lobe: 8.0 x 3.7 x 4.1 cm   Left lobe: 7.5 x 3.6 x 3.8 cm   _________________________________________________________   Estimated total number of nodules >/= 1 cm: >10   Number of spongiform nodules >/=  2 cm not described below (TR1): 0   Number of mixed cystic and solid nodules >/= 1.5 cm not described below (TR2): 0   _________________________________________________________   Nodule labeled 1 is a solid isoechoic TR 3 nodule in the right thyroid  lobe measuring 2.3 x 2.2 x 1.6 cm. *Given size (>/= 1.5 - 2.4 cm) and appearance, a follow-up ultrasound in 1 year should be considered based on TI-RADS criteria.   Nodule labeled 2 is a solid isoechoic TR 3 nodule in the right thyroid  lobe measuring 2.1 x 1.9 x 1.7 cm. *Given size (>/= 1.5 - 2.4 cm) and appearance, a follow-up ultrasound in 1 year should be considered based on TI-RADS criteria.   Nodule labeled 3 is a solid isoechoic TR 3 nodule in the right thyroid  lobe measuring 2.2 x 2.1 x 1.7 cm. *Given size (>/= 1.5 - 2.4 cm) and appearance, a follow-up ultrasound in 1 year should be considered based on TI-RADS criteria.   Nodule labeled 4 is a solid hypoechoic TR 4 nodule in the right thyroid  lobe measuring 2.1 x 2.0 x 1.8 cm. **Given size (>/= 1.5 cm) and appearance, fine needle aspiration of this moderately suspicious nodule should be considered based on TI-RADS criteria.   Nodule  labeled 5 is a solid hypoechoic TR 4 nodule in the right thyroid  lobe measuring 2.3 x 2.0 x 1.8 cm. **Given size (>/= 1.5 cm) and appearance, fine needle aspiration of this moderately suspicious nodule should be considered based on TI-RADS criteria.   Nodule labeled 6 is a solid isoechoic TR 3 nodule in the right thyroid  lobe measuring 1.4 x 1.2 x 1.0 cm. Given size (<1.4 cm) and appearance, this nodule does NOT meet TI-RADS criteria for biopsy or dedicated follow-up.   Nodule labeled 7 is a solid isoechoic TR 3 nodule in the left thyroid  lobe measuring 2.9 x 2.2 x 2.0 cm. **Given size (>/= 2.5 cm) and appearance, fine needle aspiration of this mildly suspicious nodule should be considered based on TI-RADS criteria.   Nodule labeled 8 is a solid isoechoic TR 3 nodule in the left thyroid  lobe measuring 2.5 x 2.0 x 1.6 cm. **Given size (>/= 2.5 cm) and appearance, fine needle aspiration of this mildly suspicious nodule should be considered based on TI-RADS criteria.   Nodule labeled 9 is a solid isoechoic TR 3 nodule in the left thyroid  lobe measuring 1.9 x 1.8 x 1.8 cm. *Given size (>/= 1.5 - 2.4 cm) and appearance, a follow-up ultrasound in 1 year should be considered based on TI-RADS criteria.   Nodule labeled 10 is a small mixed cystic and solid nodule in the left thyroid  lobe measuring 1.1 cm. This nodule does NOT meet TI-RADS criteria for biopsy or dedicated follow-up.   IMPRESSION: 1. Enlarged multinodular thyroid  gland with numerous nodules essentially replacing the normal thyroid  parenchyma. 2. Nodules labeled 4, 5, 7 and 8 meet criteria for biopsy. Per ACR TI-RADS criteria, the 2 most suspicious nodules should be biopsied, which are the nodules labeled 4 and 5 (both of these are TR 4 nodules in the right thyroid  lobe). 3. Nodules labeled 1, 2, 3 and 9 meet criteria for follow-up ultrasound in 1 year.   The above is in keeping with the ACR TI-RADS recommendations - J  Am Coll Radiol 2017;14:587-595.     Electronically Signed   By: Felton Fry M.D.   On: 03/08/2021 11:11 ----------------------------------------------------------------------------------------------------------------------  ASSESSMENT / PLAN:  1. Multinodular goiter-s/p total thyroidectomy 2. Postsurgical hypothyroidism  She is s/p total thyroidectomy on 05/05/21 by Dr. Oneil Budge.    Her previsit thyroid  function tests are consistent with slight under-replacement.  She is advised to increase her Levothyroxine  112 mcg po daily before breakfast.  Will recheck TFTs prior to next visit and adjust dose accordingly.   - The correct intake of thyroid  hormone (Levothyroxine , Synthroid ), is on empty stomach first thing in the morning, with water, separated by at least 30 minutes from breakfast and other medications,  and separated by more than 4 hours from calcium , iron, multivitamins, acid reflux medications (PPIs).  - This medication is a life-long medication and will be needed to correct thyroid  hormone imbalances for the rest of your life.  The dose may change from time to time, based on thyroid  blood work.  - It is extremely important to be consistent taking this medication, near the same time each morning.  -AVOID TAKING PRODUCTS CONTAINING BIOTIN (commonly found in Hair, Skin, Nails vitamins) AS IT INTERFERES WITH THE VALIDITY OF THYROID  FUNCTION BLOOD TESTS.  I do not feel there is really any clinical association between her UTIs and her thyroidectomy.  She may need referral to urologist in the future if she continues to get multiple UTIs.  Follow Up Plan:  Return in about 1 year (around 07/17/2024) for Thyroid  follow up, Previsit labs.     I spent  29  minutes in the care of the patient today including review of labs from Thyroid  Function, CMP, and other relevant labs ; imaging/biopsy records (current and previous including abstractions from other facilities); face-to-face time  discussing  her lab results and symptoms, medications doses, her options of short and long term treatment based on the latest standards of care / guidelines;   and documenting the encounter.  Barnie JAYSON Jester  participated in the discussions, expressed understanding, and voiced agreement with the above plans.  All questions were answered to her satisfaction. she is encouraged to contact clinic should she have any questions or concerns prior to her return visit.    Benton Rio, [facility] [address] [place], [place] [address] Phone: [phone] Fax: [phone]

## 2023-07-19 ENCOUNTER — Ambulatory Visit: Payer: Self-pay | Admitting: Nurse Practitioner

## 2023-07-19 LAB — UA/M W/RFLX CULTURE, ROUTINE
Bilirubin, UA: NEGATIVE
Glucose, UA: NEGATIVE
Ketones, UA: NEGATIVE
Nitrite, UA: POSITIVE — AB
Specific Gravity, UA: 1.014 (ref 1.005–1.030)
Urobilinogen, Ur: 0.2 mg/dL (ref 0.2–1.0)
pH, UA: 6.5 (ref 5.0–7.5)

## 2023-07-19 LAB — MICROSCOPIC EXAMINATION
Casts: NONE SEEN /LPF
WBC, UA: >30 /HPF — AB (ref 0–5)

## 2023-07-19 LAB — HEMOGLOBIN A1C
Est. average glucose Bld gHb Est-mCnc: 128 mg/dL
Hgb A1c MFr Bld: 6.1 % — ABNORMAL HIGH (ref 4.8–5.6)

## 2023-07-19 LAB — TSH+FREE T4
Free T4: 1.13 ng/dL (ref 0.82–1.77)
TSH: 3.92 u[IU]/mL (ref 0.450–4.500)

## 2023-07-19 LAB — URINE CULTURE, REFLEX

## 2023-08-01 ENCOUNTER — Ambulatory Visit: Payer: Self-pay | Admitting: Gerontology

## 2023-08-01 DIAGNOSIS — N390 Urinary tract infection, site not specified: Secondary | ICD-10-CM

## 2023-08-06 ENCOUNTER — Other Ambulatory Visit: Payer: Self-pay

## 2023-08-06 DIAGNOSIS — N390 Urinary tract infection, site not specified: Secondary | ICD-10-CM

## 2023-08-08 ENCOUNTER — Other Ambulatory Visit: Payer: Self-pay

## 2023-08-08 ENCOUNTER — Ambulatory Visit: Payer: Self-pay | Admitting: Gerontology

## 2023-08-08 MED ORDER — CIPROFLOXACIN HCL 250 MG PO TABS
500.0000 mg | ORAL_TABLET | Freq: Two times a day (BID) | ORAL | 0 refills | Status: DC
  Filled 2023-08-08: qty 40, 10d supply, fill #0

## 2023-08-08 NOTE — Progress Notes (Signed)
 Patient was notified about urine culture result, she states that she continues to experience dysuria, sensation of incomplete emptying, urinary frequency and urgency. Allergic to penicillin, was treated with Bactrim  and Nitrofurantoin . She has decision making capacity, educated her on the side effects of Cipro  and to notify clinic and go to the ED. She verbalized understanding and agreed with treatment plan. She was advised to increase water intake and practice proper perineal hygiene.

## 2023-08-08 NOTE — Addendum Note (Signed)
 Addended by: Josemiguel Gries E on: 08/08/2023 12:12 PM   Modules accepted: Orders

## 2023-08-09 ENCOUNTER — Encounter: Payer: Self-pay | Admitting: Cardiovascular Disease

## 2023-08-09 ENCOUNTER — Ambulatory Visit: Payer: Self-pay | Attending: Cardiovascular Disease | Admitting: Cardiovascular Disease

## 2023-08-09 VITALS — BP 126/78 | HR 67 | Ht 71.0 in | Wt 268.5 lb

## 2023-08-09 DIAGNOSIS — I5022 Chronic systolic (congestive) heart failure: Secondary | ICD-10-CM

## 2023-08-09 DIAGNOSIS — E785 Hyperlipidemia, unspecified: Secondary | ICD-10-CM

## 2023-08-09 DIAGNOSIS — I10 Essential (primary) hypertension: Secondary | ICD-10-CM

## 2023-08-09 DIAGNOSIS — M79606 Pain in leg, unspecified: Secondary | ICD-10-CM

## 2023-08-09 DIAGNOSIS — I2510 Atherosclerotic heart disease of native coronary artery without angina pectoris: Secondary | ICD-10-CM

## 2023-08-09 LAB — UA/M W/RFLX CULTURE, ROUTINE
Bilirubin, UA: NEGATIVE
Glucose, UA: NEGATIVE
Ketones, UA: NEGATIVE
Nitrite, UA: POSITIVE — AB
Specific Gravity, UA: 1.017 (ref 1.005–1.030)
Urobilinogen, Ur: 0.2 mg/dL (ref 0.2–1.0)
pH, UA: 6.5 (ref 5.0–7.5)

## 2023-08-09 LAB — MICROSCOPIC EXAMINATION
Casts: NONE SEEN /LPF
WBC, UA: >30 /HPF — AB (ref 0–5)

## 2023-08-09 LAB — URINE CULTURE, REFLEX

## 2023-08-09 NOTE — Patient Instructions (Signed)
 Medication Instructions:  No changes *If you need a refill on your cardiac medications before your next appointment, please call your pharmacy*  Lab Work: None ordered If you have labs (blood work) drawn today and your tests are completely normal, you will receive your results only by: MyChart Message (if you have MyChart) OR A paper copy in the mail If you have any lab test that is abnormal or we need to change your treatment, we will call you to review the results.  Testing/Procedures: Your physician has requested that you have an ankle brachial index (ABI). During this test an ultrasound and blood pressure cuff are used to evaluate the arteries that supply the arms and legs with blood.  Allow thirty minutes for this exam.  There are no restrictions or special instructions.  This will take place at [address] [facility], [place] [address]  Please note: We ask at that you not bring children with you during ultrasound (echo/ vascular) testing. Due to room size and safety concerns, children are not allowed in the ultrasound rooms during exams. Our front office staff cannot provide observation of children in our lobby area while testing is being conducted. An adult accompanying a patient to their appointment will only be allowed in the ultrasound room at the discretion of the ultrasound technician under special circumstances. We apologize for any inconvenience.   Follow-Up: At [facility], you and your health needs are our priority.  As part of our continuing mission to provide you with exceptional heart care, our providers are all part of one team.  This team includes your primary Cardiologist (physician) and Advanced Practice Providers or APPs (Physician Assistants and Nurse Practitioners) who all work together to provide you with the care you need, when you need it.  Your next appointment:   12 month(s)  Provider:   You may see Deatrice Cage, MD or  one of the following Advanced Practice Providers on your designated Care Team:   Lonni Meager, NP Lesley Maffucci, PA-C Bernardino Bring, PA-C Cadence Highfill, PA-C Tylene Lunch, NP Barnie Hila, NP    We recommend signing up for the patient portal called MyChart.  Sign up information is provided on this After Visit Summary.  MyChart is used to connect with patients for Virtual Visits (Telemedicine).  Patients are able to view lab/test results, encounter notes, upcoming appointments, etc.  Non-urgent messages can be sent to your provider as well.   To learn more about what you can do with MyChart, go to [email or web address].

## 2023-08-09 NOTE — Progress Notes (Signed)
 Cardiology Office Note   Date:  08/09/2023   ID:  Lindsey Gibson, DOB [birth year], MRN [id number]  PCP:  Iloabachie, Chioma E, NP  Cardiologist:   Deatrice Cage, MD   Chief Complaint  Patient presents with   Follow-up    12 month f/u c/o leg/knee weakness. Meds reviewed verbally with pt.      History of Present Illness: Lindsey Gibson is a 64 y.o. female who presents for a follow-up visit regarding coronary artery disease and chronic systolic heart failure.  Other medical problems include hyperlipidemia, obesity and COPD. She was hospitalized in December, 2018 with pulmonary edema and non-ST elevation myocardial infarction.  Echocardiogram showed an EF of 20-25%.  Cardiac catheterization showed severe mid LAD disease which was treated with PCI and drug-eluting stent placement.  She did not tolerate Entresto  due to hypotension and recurrent chest pain.   She underwent a repeat echocardiogram in April of 2019 which showed improvement in EF to 50-55%.  There was evidence of grade 2 diastolic dysfunction.  She underwent thyroidectomy in May of 2023 due to thyroid  goiter.   She has been doing well with no chest pain, shortness of breath or palpitations.  She is dealing with recurrent UTIs. She does complain of bilateral leg weakness and giving out.  Symptoms improved with rest and then she can resume walking.  Past Medical History:  Diagnosis Date   Anemia    Anxiety    Arthritis    Asthma    CAD (coronary artery disease)    a. 12/2016 NSTEMI/PCI: LM nl, LAD 30ost, 74m (IVUS->severe plaque burden->3.0 x 18 Sierra DES), D1/2/3 nl, LCX 30p, OM1/2/3 nl, RCA nl, RPDA/RPAV/RPL1/2 nl.   COPD (chronic obstructive pulmonary disease) (HCC)    Hyperlipidemia    Hypertension    Ischemic cardiomyopathy    a. 12/2016 Echo: EF 20-25%, mid-apicalanteroseptal, ant, and apical AK, GR1 DD, triv AI, mildly dil LA; b. 04/2017 Echo: EF 50-55%, Gr2 DD, Nl RV fxn.   Morbid obesity (HCC)     Myocardial infarction (HCC) 2018    Past Surgical History:  Procedure Laterality Date   CORONARY STENT INTERVENTION N/A 12/27/2016   Procedure: CORONARY STENT INTERVENTION;  Surgeon: Cage Deatrice LABOR, MD;  Location: [facility] INVASIVE CV LAB;  Service: Cardiovascular;  Laterality: N/A;   CORONARY ULTRASOUND/IVUS N/A 12/27/2016   Procedure: Intravascular Ultrasound/IVUS;  Surgeon: Cage Deatrice LABOR, MD;  Location: [facility] INVASIVE CV LAB;  Service: Cardiovascular;  Laterality: N/A;   KNEE ARTHROSCOPY Right    LEFT HEART CATH AND CORONARY ANGIOGRAPHY N/A 12/27/2016   Procedure: LEFT HEART CATH AND CORONARY ANGIOGRAPHY;  Surgeon: Cage Deatrice LABOR, MD;  Location: [facility] INVASIVE CV LAB;  Service: Cardiovascular;  Laterality: N/A;   SHOULDER ARTHROSCOPY WITH SUBACROMIAL DECOMPRESSION Right 11/22/2015   Procedure: SHOULDER ARTHROSCOPY WITH DEBRIDEMENT, DECOMPRESSION  AND BICEPS TENODESIS;  Surgeon: Norleen JINNY Maltos, MD;  Location: [facility] ORS;  Service: Orthopedics;  Laterality: Right;   THYROIDECTOMY N/A 05/05/2021   Procedure: THYROIDECTOMY, TOTAL;  Surgeon: Mavis Anes, MD;  Location: [facility];  Service: General;  Laterality: N/A;   TONSILLECTOMY     ULNAR NERVE TRANSPOSITION Right 11/22/2015   Procedure: ULNAR NERVE DECOMPRESSION/TRANSPOSITION;  Surgeon: Norleen JINNY Maltos, MD;  Location: [facility] ORS;  Service: Orthopedics;  Laterality: Right;     Current Outpatient Medications  Medication Sig Dispense Refill   albuterol  (PROVENTIL  HFA) 108 (90 Base) MCG/ACT inhaler Inhale 1 puff into the lungs every 6 (six) hours as needed for  wheezing or shortness of breath 20.1 g 1   aspirin  EC 81 MG tablet Take 81 mg by mouth daily. Swallow whole.     atorvastatin  (LIPITOR) 20 MG tablet Take 4 tablets (80 mg total) by mouth daily. 360 tablet 2   carvedilol  (COREG ) 3.125 MG tablet Take 1 tablet (3.125 mg total) by mouth 2 (two) times daily with a meal. 180 tablet 3   cetirizine  (ZYRTEC ) 10 MG tablet Take 1 tablet (10 mg total) by  mouth daily. 90 tablet 1   ciprofloxacin  (CIPRO ) 250 MG tablet Take 2 tablets (500 mg total) by mouth 2 (two) times daily. 40 tablet 0   ibuprofen  (ADVIL ) 200 MG tablet Take 2 tablets (400 mg total) by mouth every 6 (six) hours as needed for mild pain. 30 tablet 0   levothyroxine  (SYNTHROID ) 112 MCG tablet Take 1 tablet (112 mcg total) by mouth daily before breakfast. 90 tablet 3   losartan  (COZAAR ) 50 MG tablet Take 1 tablet (50 mg total) by mouth daily. 90 tablet 3   Multiple Vitamin (MULTIVITAMIN WITH MINERALS) TABS tablet Take 1 tablet by mouth daily.     spironolactone  (ALDACTONE ) 25 MG tablet Take 1 tablet (25 mg total) by mouth daily. 90 tablet 3   nitrofurantoin , macrocrystal-monohydrate, (MACROBID ) 100 MG capsule Take 1 capsule (100 mg total) by mouth 2 (two) times daily. (Patient not taking: Reported on 08/09/2023) 10 capsule 0   No current facility-administered medications for this visit.    Allergies:   Penicillins    Social History:  The patient  reports that she quit smoking about 27 years ago. Her smoking use included cigarettes. She started smoking about 45 years ago. She has a 18 pack-year smoking history. She has never used smokeless tobacco. She reports that she does not drink alcohol and does not use drugs.   Family History:  The patient's family history includes Diabetes in her brother, mother, and sister; Heart disease in her mother and sister; Hypertension in her mother; Other in her father, maternal grandfather, maternal grandmother, paternal grandfather, and paternal grandmother.    ROS:  Please see the history of present illness.   Otherwise, review of systems are positive for none.   All other systems are reviewed and negative.    PHYSICAL EXAM: VS:  BP 126/78 (BP Location: Left Arm, Patient Position: Sitting, Cuff Size: Large)   Pulse 67   Ht 5' 11 (1.803 m)   Wt 268 lb 8 oz (121.8 kg)   SpO2 97%   BMI 37.45 kg/m  , BMI Body mass index is 37.45 kg/m. GEN:  Well nourished, well developed, in no acute distress  HEENT: normal  Neck: no JVD, carotid bruits, or masses Cardiac: RRR; no murmurs, rubs, or gallops, no edema. Respiratory:  clear to auscultation bilaterally, normal work of breathing GI: soft, nontender, nondistended, + BS MS: no deformity or atrophy  Skin: warm and dry, no rash Neuro:  Strength and sensation are intact Psych: euthymic mood, full affect   EKG:  EKG is ordered today. EKG showed : Normal sinus rhythm Normal ECG When compared with ECG of 27-Jul-2022 08:50, No significant change was found    Recent Labs: 11/21/2022: ALT 25; BUN 12; Creatinine, Ser 0.93; Hemoglobin 11.9; Platelets 240; Potassium 4.5; Sodium 143 07/16/2023: TSH 3.920    Lipid Panel    Component Value Date/Time   CHOL 136 11/21/2022 0952   TRIG 224 (H) 11/21/2022 0952   HDL 38 (L) 11/21/2022 0952   CHOLHDL  3.6 11/21/2022 0952   CHOLHDL 4.7 12/28/2016 0642   VLDL 24 12/28/2016 0642   LDLCALC 62 11/21/2022 0952      Wt Readings from Last 3 Encounters:  08/09/23 268 lb 8 oz (121.8 kg)  07/18/23 266 lb 9.6 oz (120.9 kg)  08/02/22 267 lb (121.1 kg)           No data to display            ASSESSMENT AND PLAN:  1.  Coronary artery disease involving native coronary arteries without angina: Continue aspirin  indefinitely.  2.  Chronic systolic heart failure: Ejection fraction improved to 50-55%.  No evidence of volume overload without any diuretics other than spironolactone .  Continue losartan  and carvedilol .  Most recent labs in November showed normal renal function and electrolytes.  3.  Hyperlipidemia: I reviewed most recent labs which showed an LDL of 62.  Continue high-dose atorvastatin .  4.  Essential hypertension: Blood pressure is well controlled on current medications.  5.  Atypical leg claudication: I requested an ABI.  Her distal pulses are diminished.    Disposition:   FU with me in 12 months  Signed,  Deatrice Cage, MD  08/09/2023 9:23 AM    [facility]

## 2023-08-15 NOTE — Addendum Note (Signed)
 Addended by: Pratham Cassatt E on: 08/15/2023 11:56 AM   Modules accepted: Orders

## 2023-08-22 ENCOUNTER — Ambulatory Visit: Payer: Self-pay | Attending: Cardiovascular Disease

## 2023-08-22 DIAGNOSIS — M79605 Pain in left leg: Secondary | ICD-10-CM

## 2023-08-22 DIAGNOSIS — M79604 Pain in right leg: Secondary | ICD-10-CM

## 2023-08-22 DIAGNOSIS — M79606 Pain in leg, unspecified: Secondary | ICD-10-CM

## 2023-08-25 LAB — VAS US ABI WITH/WO TBI
Left ABI: 0.9
Right ABI: 0.93

## 2023-08-26 ENCOUNTER — Ambulatory Visit: Payer: Self-pay | Admitting: Cardiovascular Disease

## 2023-08-29 ENCOUNTER — Other Ambulatory Visit: Payer: Self-pay | Admitting: Gerontology

## 2023-08-29 ENCOUNTER — Other Ambulatory Visit: Payer: Self-pay

## 2023-08-29 DIAGNOSIS — J449 Chronic obstructive pulmonary disease, unspecified: Secondary | ICD-10-CM

## 2023-08-29 MED ORDER — ATORVASTATIN CALCIUM 80 MG PO TABS
80.0000 mg | ORAL_TABLET | Freq: Every day | ORAL | 1 refills | Status: DC
  Filled 2023-08-29: qty 90, 90d supply, fill #0
  Filled 2023-11-11 (×2): qty 90, 90d supply, fill #1

## 2023-08-29 MED ORDER — ALBUTEROL SULFATE HFA 108 (90 BASE) MCG/ACT IN AERS
1.0000 | INHALATION_SPRAY | Freq: Four times a day (QID) | RESPIRATORY_TRACT | 1 refills | Status: AC | PRN
  Filled 2023-08-29: qty 6.7, 25d supply, fill #0

## 2023-08-29 MED FILL — Cetirizine HCl Tab 10 MG: ORAL | 90 days supply | Qty: 90 | Fill #1 | Status: AC

## 2023-08-29 MED FILL — Cetirizine HCl Tab 10 MG: ORAL | 90 days supply | Qty: 90 | Fill #1 | Status: CN

## 2023-08-30 ENCOUNTER — Other Ambulatory Visit: Payer: Self-pay

## 2023-09-17 ENCOUNTER — Other Ambulatory Visit: Payer: Self-pay

## 2023-09-17 DIAGNOSIS — Z0000 Encounter for general adult medical examination without abnormal findings: Secondary | ICD-10-CM

## 2023-09-17 DIAGNOSIS — N390 Urinary tract infection, site not specified: Secondary | ICD-10-CM

## 2023-09-23 LAB — CBC WITH DIFFERENTIAL/PLATELET
Basophils Absolute: 0 x10E3/uL (ref 0.0–0.2)
Basos: 0 %
EOS (ABSOLUTE): 0.2 x10E3/uL (ref 0.0–0.4)
Eos: 2 %
Hematocrit: 39.2 % (ref 34.0–46.6)
Hemoglobin: 12.4 g/dL (ref 11.1–15.9)
Immature Grans (Abs): 0 x10E3/uL (ref 0.0–0.1)
Immature Granulocytes: 0 %
Lymphocytes Absolute: 2.4 x10E3/uL (ref 0.7–3.1)
Lymphs: 35 %
MCH: 27 pg (ref 26.6–33.0)
MCHC: 31.6 g/dL (ref 31.5–35.7)
MCV: 85 fL (ref 79–97)
Monocytes Absolute: 0.5 x10E3/uL (ref 0.1–0.9)
Monocytes: 7 %
Neutrophils Absolute: 3.8 x10E3/uL (ref 1.4–7.0)
Neutrophils: 56 %
Platelets: 250 x10E3/uL (ref 150–450)
RBC: 4.59 x10E6/uL (ref 3.77–5.28)
RDW: 14.5 % (ref 11.7–15.4)
WBC: 6.8 x10E3/uL (ref 3.4–10.8)

## 2023-09-23 LAB — UA/M W/RFLX CULTURE, ROUTINE
Bilirubin, UA: NEGATIVE
Glucose, UA: NEGATIVE
Ketones, UA: NEGATIVE
Nitrite, UA: NEGATIVE
Specific Gravity, UA: 1.017 (ref 1.005–1.030)
Urobilinogen, Ur: 0.2 mg/dL (ref 0.2–1.0)
pH, UA: 6 (ref 5.0–7.5)

## 2023-09-23 LAB — MICROSCOPIC EXAMINATION
Casts: NONE SEEN /LPF
Epithelial Cells (non renal): >10 /HPF — AB (ref 0–10)
RBC, Urine: >30 /HPF — AB (ref 0–2)
WBC, UA: >30 /HPF — AB (ref 0–5)

## 2023-09-23 LAB — COMPREHENSIVE METABOLIC PANEL WITH GFR
ALT: 29 IU/L (ref 0–32)
AST: 28 IU/L (ref 0–40)
Albumin: 4.3 g/dL (ref 3.9–4.9)
Alkaline Phosphatase: 104 IU/L (ref 49–135)
BUN/Creatinine Ratio: 13 (ref 12–28)
BUN: 11 mg/dL (ref 8–27)
Bilirubin Total: 1.3 mg/dL — ABNORMAL HIGH (ref 0.0–1.2)
CO2: 21 mmol/L (ref 20–29)
Calcium: 9.5 mg/dL (ref 8.7–10.3)
Chloride: 103 mmol/L (ref 96–106)
Creatinine, Ser: 0.87 mg/dL (ref 0.57–1.00)
Globulin, Total: 2.8 g/dL (ref 1.5–4.5)
Glucose: 109 mg/dL — ABNORMAL HIGH (ref 70–99)
Potassium: 4.1 mmol/L (ref 3.5–5.2)
Sodium: 140 mmol/L (ref 134–144)
Total Protein: 7.1 g/dL (ref 6.0–8.5)
eGFR: 74 mL/min/1.73 (ref >59)

## 2023-09-23 LAB — URINE CULTURE, REFLEX

## 2023-09-23 LAB — LIPID PANEL
Chol/HDL Ratio: 3.8 ratio (ref 0.0–4.4)
Cholesterol, Total: 146 mg/dL (ref 100–199)
HDL: 38 mg/dL — ABNORMAL LOW (ref >39)
LDL Chol Calc (NIH): 61 mg/dL (ref 0–99)
Triglycerides: 296 mg/dL — ABNORMAL HIGH (ref 0–149)
VLDL Cholesterol Cal: 47 mg/dL — ABNORMAL HIGH (ref 5–40)

## 2023-09-23 LAB — HEMOGLOBIN A1C
Est. average glucose Bld gHb Est-mCnc: 128 mg/dL
Hgb A1c MFr Bld: 6.1 % — ABNORMAL HIGH (ref 4.8–5.6)

## 2023-09-28 ENCOUNTER — Other Ambulatory Visit: Payer: Self-pay

## 2023-11-11 ENCOUNTER — Other Ambulatory Visit: Payer: Self-pay

## 2023-11-12 ENCOUNTER — Other Ambulatory Visit: Payer: Self-pay

## 2023-11-12 DIAGNOSIS — N390 Urinary tract infection, site not specified: Secondary | ICD-10-CM

## 2023-11-14 ENCOUNTER — Other Ambulatory Visit: Payer: Self-pay

## 2023-11-14 ENCOUNTER — Telehealth: Payer: Self-pay | Admitting: Gerontology

## 2023-11-14 DIAGNOSIS — N390 Urinary tract infection, site not specified: Secondary | ICD-10-CM

## 2023-11-14 LAB — URINE CULTURE

## 2023-11-14 MED ORDER — NITROFURANTOIN MONOHYD MACRO 100 MG PO CAPS
100.0000 mg | ORAL_CAPSULE | Freq: Two times a day (BID) | ORAL | 0 refills | Status: AC
  Filled 2023-11-14: qty 14, 7d supply, fill #0

## 2023-11-14 NOTE — Telephone Encounter (Signed)
 Notified [name] about UTI, advised to pick up Macrobid  100 mg bid x7 days, educated on side effects, increase water intake, she declines Urology referral, we discuss further during her appointment next week.

## 2023-11-15 LAB — URINALYSIS

## 2023-11-21 ENCOUNTER — Ambulatory Visit: Payer: Self-pay | Admitting: Gerontology

## 2023-11-21 ENCOUNTER — Other Ambulatory Visit: Payer: Self-pay

## 2023-11-21 DIAGNOSIS — E785 Hyperlipidemia, unspecified: Secondary | ICD-10-CM

## 2023-11-21 DIAGNOSIS — N390 Urinary tract infection, site not specified: Secondary | ICD-10-CM

## 2023-11-21 DIAGNOSIS — Z0000 Encounter for general adult medical examination without abnormal findings: Secondary | ICD-10-CM

## 2023-11-21 DIAGNOSIS — R7303 Prediabetes: Secondary | ICD-10-CM

## 2023-11-21 DIAGNOSIS — I10 Essential (primary) hypertension: Secondary | ICD-10-CM

## 2023-11-21 MED ORDER — ATORVASTATIN CALCIUM 80 MG PO TABS
80.0000 mg | ORAL_TABLET | Freq: Every day | ORAL | 1 refills | Status: AC
  Filled 2023-11-21 – 2023-12-30 (×2): qty 90, 90d supply, fill #0
  Filled 2024-01-26 – 2024-01-28 (×2): qty 90, 90d supply, fill #1

## 2023-11-21 NOTE — Patient Instructions (Signed)

## 2023-11-21 NOTE — Progress Notes (Signed)
 Established Patient Office Visit  Subjective   Patient ID: Lindsey Gibson, female    DOB: Feb 24, 1960  Age: 63 y.o. MRN: [id number]  No chief complaint on file. Patient consents to telephone visit and to patient identifiers was used to identify patient.  HPI  Lindsey Gibson is a 63 y/o female who has history of total thyroidectomy, anemia, anxiety, arthritis, asthma, CAD, COPD, hyperlipidemia, hypertension, ischemic cardiomyopathy, morbid obesity, myocardial infarction, who presents for follow-up visit and lab review. Coronary artery disease involving native coronary arteries without angina: She was seen by the Cardiologist Dr Darron Grass on 08/09/23 and the plan are as follows, Continue aspirin  indefinitely.  2.  Chronic systolic heart failure: Ejection fraction improved to 50-55%.  No evidence of volume overload without any diuretics other than spironolactone .  Continue losartan  and carvedilol .  Most recent labs in November showed normal renal function and electrolytes.3.  Hyperlipidemia: I reviewed most recent labs which showed an LDL of 62.  Continue high-dose atorvastatin .   4.  Essential hypertension: Blood pressure is well controlled on current medications. 5.  Atypical leg claudication: I requested an ABI.  Her distal pulses are diminished.  She states that she is compliant with her medications, denies side effects and continues to make healthy lifestyle changes.  She continues to take Macrobid  100 mg bid for recurrent urinary tract infection, denies dysuria, urinary frequency or urgency and flank pain.  She declines referral to urology states that she will wait until she gets Medicare next year. Her lab was unremarkable except HDL was 38 mg/dl, YhaJ8r was 3.8%. Overall, she states that she is doing well and offers no further complaint.  Review of Systems  Constitutional: Negative.   Respiratory: Negative.    Cardiovascular: Negative.   Genitourinary: Negative.   Skin: Negative.    Neurological: Negative.   Endo/Heme/Allergies: Negative.   Psychiatric/Behavioral: Negative.        Objective:     There were no vitals taken for this visit. BP Readings from Last 3 Encounters:  09/17/23 135/76  08/09/23 126/78  07/18/23 128/78   Wt Readings from Last 3 Encounters:  09/17/23 266 lb 3.2 oz (120.7 kg)  08/09/23 268 lb 8 oz (121.8 kg)  07/18/23 266 lb 9.6 oz (120.9 kg)      Physical Exam   No results found for any visits on 11/21/23.  Last CBC Lab Results  Component Value Date   WBC 6.8 09/17/2023   HGB 12.4 09/17/2023   HCT 39.2 09/17/2023   MCV 85 09/17/2023   MCH 27.0 09/17/2023   RDW 14.5 09/17/2023   PLT 250 09/17/2023   Last metabolic panel Lab Results  Component Value Date   GLUCOSE 109 (H) 09/17/2023   NA 140 09/17/2023   K 4.1 09/17/2023   CL 103 09/17/2023   CO2 21 09/17/2023   BUN 11 09/17/2023   CREATININE 0.87 09/17/2023   EGFR 74 09/17/2023   CALCIUM  9.5 09/17/2023   PHOS 3.5 05/06/2021   PROT 7.1 09/17/2023   ALBUMIN 4.3 09/17/2023   LABGLOB 2.8 09/17/2023   AGRATIO 1.9 11/29/2021   BILITOT 1.3 (H) 09/17/2023   ALKPHOS 104 09/17/2023   AST 28 09/17/2023   ALT 29 09/17/2023   ANIONGAP 6 05/06/2021   Last lipids Lab Results  Component Value Date   CHOL 146 09/17/2023   HDL 38 (L) 09/17/2023   LDLCALC 61 09/17/2023   TRIG 296 (H) 09/17/2023   CHOLHDL 3.8 09/17/2023   Last hemoglobin A1c  Lab Results  Component Value Date   HGBA1C 6.1 (H) 09/17/2023   Last thyroid  functions Lab Results  Component Value Date   TSH 3.920 07/16/2023   T3TOTAL 132 02/22/2021   T4TOTAL 7.3 06/14/2021   FREET4 1.13 07/16/2023   Last vitamin D No results found for: 25OHVITD2, 25OHVITD3, VD25OH Last vitamin B12 and Folate No results found for: VITAMINB12, FOLATE    The ASCVD Risk score (Arnett DK, et al., 2019) failed to calculate for the following reasons:   Risk score cannot be calculated because patient has a  medical history suggesting prior/existing ASCVD    Assessment & Plan:   1. Hypertension, unspecified type (Primary) - Her blood pressure is improving, will continue current medication, DASH diet and exercise as tolerated.  2. Elevated lipids -Her HDL is 38 mg/dl, will continue current medication, low-fat/cholesterol diet and exercise as tolerated.  Will recheck lipid panel. - atorvastatin  (LIPITOR) 80 MG tablet; Take 1 tablet (80 mg total) by mouth daily.  Dispense: 90 tablet; Refill: 1 - Lipid panel; Future  3. Prediabetes -Her hemoglobin A1c was 6.1%, she declines metformin therapy states that she will modify her diet.  She was advised to continue on low carbohydrate/non concentrated sweet diet and exercise as tolerated. - Hemoglobin A1c; Future  4. Recurrent UTI -She was advised to complete the course of Macrobid  100 mg twice daily, she declines referral to urology for recurrent urinary tract infection until she gets Medicare next year.  She was advised to increase water intake and perform proper perineal hygiene, she verbalized understanding. - UA/M w/rflx Culture, Routine; Future - Urinalysis; Future  5. Health care maintenance Will recheck her TSH-in February.  She was advised to notify clinic for any concerns.     - TSH; Future   Return in about 13 weeks (around 02/20/2024), or if symptoms worsen or fail to improve.    Myda Detwiler E Amelda Hapke, NP

## 2023-11-27 ENCOUNTER — Other Ambulatory Visit: Payer: Self-pay | Admitting: Gerontology

## 2023-11-27 ENCOUNTER — Other Ambulatory Visit: Payer: Self-pay

## 2023-11-27 DIAGNOSIS — J449 Chronic obstructive pulmonary disease, unspecified: Secondary | ICD-10-CM

## 2023-11-29 ENCOUNTER — Other Ambulatory Visit: Payer: Self-pay

## 2023-11-30 ENCOUNTER — Other Ambulatory Visit: Payer: Self-pay

## 2023-12-02 ENCOUNTER — Other Ambulatory Visit: Payer: Self-pay

## 2023-12-02 ENCOUNTER — Other Ambulatory Visit: Payer: Self-pay | Admitting: Gerontology

## 2023-12-02 DIAGNOSIS — J449 Chronic obstructive pulmonary disease, unspecified: Secondary | ICD-10-CM

## 2023-12-03 ENCOUNTER — Other Ambulatory Visit: Payer: Self-pay

## 2023-12-03 MED FILL — Cetirizine HCl Tab 10 MG: ORAL | 90 days supply | Qty: 90 | Fill #0 | Status: AC

## 2023-12-30 ENCOUNTER — Other Ambulatory Visit: Payer: Self-pay

## 2024-01-26 MED FILL — Cetirizine HCl Tab 10 MG: ORAL | 90 days supply | Qty: 90 | Fill #1 | Status: CN

## 2024-01-27 ENCOUNTER — Other Ambulatory Visit: Payer: Self-pay

## 2024-01-28 ENCOUNTER — Other Ambulatory Visit: Payer: Self-pay

## 2024-01-28 MED FILL — Cetirizine HCl Tab 10 MG: ORAL | 90 days supply | Qty: 90 | Fill #1 | Status: CN

## 2024-02-11 ENCOUNTER — Other Ambulatory Visit: Payer: Self-pay

## 2024-02-20 ENCOUNTER — Ambulatory Visit: Payer: Self-pay | Admitting: Gerontology

## 2024-07-16 ENCOUNTER — Ambulatory Visit: Payer: Self-pay | Admitting: Nurse Practitioner
